# Patient Record
Sex: Female | Born: 1985
Health system: Southern US, Community
[De-identification: ages and names within clinical notes are randomized; demographics above are authoritative.]

## PROBLEM LIST (undated history)

## (undated) DIAGNOSIS — D649 Anemia, unspecified: Secondary | ICD-10-CM

## (undated) DIAGNOSIS — L88 Pyoderma gangrenosum: Secondary | ICD-10-CM

## (undated) DIAGNOSIS — E119 Type 2 diabetes mellitus without complications: Secondary | ICD-10-CM

## (undated) DIAGNOSIS — G709 Myoneural disorder, unspecified: Secondary | ICD-10-CM

## (undated) DIAGNOSIS — F32A Depression, unspecified: Secondary | ICD-10-CM

## (undated) DIAGNOSIS — F419 Anxiety disorder, unspecified: Secondary | ICD-10-CM

## (undated) DIAGNOSIS — K219 Gastro-esophageal reflux disease without esophagitis: Secondary | ICD-10-CM

## (undated) HISTORY — PX: WISDOM TOOTH EXTRACTION: SHX21

## (undated) HISTORY — PX: DIAGNOSTIC LAPAROSCOPY: SUR761

---

## 2013-01-23 DIAGNOSIS — F3175 Bipolar disorder, in partial remission, most recent episode depressed: Secondary | ICD-10-CM | POA: Diagnosis not present

## 2013-01-23 DIAGNOSIS — IMO0002 Reserved for concepts with insufficient information to code with codable children: Secondary | ICD-10-CM | POA: Diagnosis not present

## 2013-02-27 DIAGNOSIS — Z79899 Other long term (current) drug therapy: Secondary | ICD-10-CM | POA: Diagnosis not present

## 2013-03-07 DIAGNOSIS — Z79899 Other long term (current) drug therapy: Secondary | ICD-10-CM | POA: Diagnosis not present

## 2013-03-22 DIAGNOSIS — Z79899 Other long term (current) drug therapy: Secondary | ICD-10-CM | POA: Diagnosis not present

## 2013-04-02 DIAGNOSIS — Z79899 Other long term (current) drug therapy: Secondary | ICD-10-CM | POA: Diagnosis not present

## 2013-04-18 DIAGNOSIS — Z79899 Other long term (current) drug therapy: Secondary | ICD-10-CM | POA: Diagnosis not present

## 2013-05-02 DIAGNOSIS — Z79899 Other long term (current) drug therapy: Secondary | ICD-10-CM | POA: Diagnosis not present

## 2013-05-16 DIAGNOSIS — Z79899 Other long term (current) drug therapy: Secondary | ICD-10-CM | POA: Diagnosis not present

## 2013-05-23 DIAGNOSIS — Z79899 Other long term (current) drug therapy: Secondary | ICD-10-CM | POA: Diagnosis not present

## 2013-05-30 DIAGNOSIS — Z79899 Other long term (current) drug therapy: Secondary | ICD-10-CM | POA: Diagnosis not present

## 2013-06-20 DIAGNOSIS — Z79899 Other long term (current) drug therapy: Secondary | ICD-10-CM | POA: Diagnosis not present

## 2013-08-01 DIAGNOSIS — R82998 Other abnormal findings in urine: Secondary | ICD-10-CM | POA: Diagnosis not present

## 2013-09-12 DIAGNOSIS — Z79899 Other long term (current) drug therapy: Secondary | ICD-10-CM | POA: Diagnosis not present

## 2014-08-26 DIAGNOSIS — R5382 Chronic fatigue, unspecified: Secondary | ICD-10-CM | POA: Diagnosis not present

## 2014-09-03 DIAGNOSIS — R945 Abnormal results of liver function studies: Secondary | ICD-10-CM | POA: Diagnosis not present

## 2014-09-03 DIAGNOSIS — R7989 Other specified abnormal findings of blood chemistry: Secondary | ICD-10-CM | POA: Diagnosis not present

## 2015-07-11 DIAGNOSIS — J029 Acute pharyngitis, unspecified: Secondary | ICD-10-CM | POA: Diagnosis not present

## 2015-07-11 DIAGNOSIS — H6691 Otitis media, unspecified, right ear: Secondary | ICD-10-CM | POA: Diagnosis not present

## 2016-06-27 DIAGNOSIS — L03116 Cellulitis of left lower limb: Secondary | ICD-10-CM | POA: Diagnosis not present

## 2016-06-27 DIAGNOSIS — M7989 Other specified soft tissue disorders: Secondary | ICD-10-CM | POA: Diagnosis not present

## 2017-01-16 ENCOUNTER — Inpatient Hospital Stay (HOSPITAL_COMMUNITY): Payer: Medicare Other

## 2017-01-16 ENCOUNTER — Encounter (HOSPITAL_COMMUNITY)
Admission: EM | Disposition: A | Discharge status: Home Health | Payer: Self-pay | Source: Home / Self Care | Attending: Family Medicine

## 2017-01-16 ENCOUNTER — Encounter (HOSPITAL_COMMUNITY): Payer: Self-pay | Admitting: Emergency Medicine

## 2017-01-16 ENCOUNTER — Emergency Department (HOSPITAL_COMMUNITY): Payer: Medicare Other

## 2017-01-16 ENCOUNTER — Inpatient Hospital Stay (HOSPITAL_COMMUNITY)
Admission: EM | Admit: 2017-01-16 | Discharge: 2017-01-21 | DRG: 571 | Disposition: A | Discharge status: Home Health | Payer: Medicare Other | Attending: Family Medicine | Admitting: Family Medicine

## 2017-01-16 ENCOUNTER — Other Ambulatory Visit: Payer: Self-pay

## 2017-01-16 ENCOUNTER — Inpatient Hospital Stay (HOSPITAL_COMMUNITY): Payer: Medicare Other | Admitting: Certified Registered Nurse Anesthetist

## 2017-01-16 DIAGNOSIS — Z8659 Personal history of other mental and behavioral disorders: Secondary | ICD-10-CM

## 2017-01-16 DIAGNOSIS — R110 Nausea: Secondary | ICD-10-CM | POA: Diagnosis present

## 2017-01-16 DIAGNOSIS — L97829 Non-pressure chronic ulcer of other part of left lower leg with unspecified severity: Secondary | ICD-10-CM | POA: Diagnosis present

## 2017-01-16 DIAGNOSIS — Z8632 Personal history of gestational diabetes: Secondary | ICD-10-CM | POA: Diagnosis not present

## 2017-01-16 DIAGNOSIS — I83028 Varicose veins of left lower extremity with ulcer other part of lower leg: Secondary | ICD-10-CM

## 2017-01-16 DIAGNOSIS — L0293 Carbuncle, unspecified: Secondary | ICD-10-CM | POA: Diagnosis present

## 2017-01-16 DIAGNOSIS — D619 Aplastic anemia, unspecified: Secondary | ICD-10-CM | POA: Diagnosis present

## 2017-01-16 DIAGNOSIS — Z23 Encounter for immunization: Secondary | ICD-10-CM | POA: Diagnosis not present

## 2017-01-16 DIAGNOSIS — L97822 Non-pressure chronic ulcer of other part of left lower leg with fat layer exposed: Secondary | ICD-10-CM

## 2017-01-16 DIAGNOSIS — L0292 Furuncle, unspecified: Secondary | ICD-10-CM | POA: Diagnosis present

## 2017-01-16 DIAGNOSIS — A419 Sepsis, unspecified organism: Secondary | ICD-10-CM

## 2017-01-16 DIAGNOSIS — F1990 Other psychoactive substance use, unspecified, uncomplicated: Secondary | ICD-10-CM | POA: Diagnosis present

## 2017-01-16 DIAGNOSIS — I96 Gangrene, not elsewhere classified: Secondary | ICD-10-CM | POA: Diagnosis not present

## 2017-01-16 DIAGNOSIS — Z6841 Body Mass Index (BMI) 40.0 and over, adult: Secondary | ICD-10-CM

## 2017-01-16 DIAGNOSIS — Z66 Do not resuscitate: Secondary | ICD-10-CM | POA: Diagnosis present

## 2017-01-16 DIAGNOSIS — Z8742 Personal history of other diseases of the female genital tract: Secondary | ICD-10-CM

## 2017-01-16 DIAGNOSIS — L97919 Non-pressure chronic ulcer of unspecified part of right lower leg with unspecified severity: Secondary | ICD-10-CM

## 2017-01-16 DIAGNOSIS — L03116 Cellulitis of left lower limb: Secondary | ICD-10-CM | POA: Diagnosis present

## 2017-01-16 DIAGNOSIS — S81802A Unspecified open wound, left lower leg, initial encounter: Secondary | ICD-10-CM | POA: Diagnosis present

## 2017-01-16 DIAGNOSIS — F17210 Nicotine dependence, cigarettes, uncomplicated: Secondary | ICD-10-CM | POA: Diagnosis present

## 2017-01-16 DIAGNOSIS — L97819 Non-pressure chronic ulcer of other part of right lower leg with unspecified severity: Secondary | ICD-10-CM | POA: Diagnosis present

## 2017-01-16 DIAGNOSIS — Z205 Contact with and (suspected) exposure to viral hepatitis: Secondary | ICD-10-CM | POA: Diagnosis present

## 2017-01-16 DIAGNOSIS — L97929 Non-pressure chronic ulcer of unspecified part of left lower leg with unspecified severity: Secondary | ICD-10-CM

## 2017-01-16 DIAGNOSIS — E11622 Type 2 diabetes mellitus with other skin ulcer: Secondary | ICD-10-CM | POA: Diagnosis present

## 2017-01-16 DIAGNOSIS — I83008 Varicose veins of unspecified lower extremity with ulcer other part of lower leg: Secondary | ICD-10-CM | POA: Diagnosis present

## 2017-01-16 DIAGNOSIS — Z888 Allergy status to other drugs, medicaments and biological substances status: Secondary | ICD-10-CM

## 2017-01-16 DIAGNOSIS — Z207 Contact with and (suspected) exposure to pediculosis, acariasis and other infestations: Secondary | ICD-10-CM | POA: Diagnosis present

## 2017-01-16 DIAGNOSIS — L88 Pyoderma gangrenosum: Secondary | ICD-10-CM | POA: Diagnosis present

## 2017-01-16 DIAGNOSIS — S81802D Unspecified open wound, left lower leg, subsequent encounter: Secondary | ICD-10-CM | POA: Diagnosis present

## 2017-01-16 DIAGNOSIS — E118 Type 2 diabetes mellitus with unspecified complications: Secondary | ICD-10-CM | POA: Diagnosis present

## 2017-01-16 DIAGNOSIS — E119 Type 2 diabetes mellitus without complications: Secondary | ICD-10-CM

## 2017-01-16 DIAGNOSIS — D509 Iron deficiency anemia, unspecified: Secondary | ICD-10-CM | POA: Diagnosis present

## 2017-01-16 DIAGNOSIS — Z590 Homelessness: Secondary | ICD-10-CM

## 2017-01-16 HISTORY — PX: APPLICATION OF WOUND VAC: SHX5189

## 2017-01-16 HISTORY — DX: Type 2 diabetes mellitus without complications: E11.9

## 2017-01-16 HISTORY — PX: I & D EXTREMITY: SHX5045

## 2017-01-16 LAB — URINALYSIS, ROUTINE W REFLEX MICROSCOPIC
Bilirubin Urine: NEGATIVE
Glucose, UA: NEGATIVE mg/dL
Hgb urine dipstick: NEGATIVE
Ketones, ur: NEGATIVE mg/dL
LEUKOCYTES UA: NEGATIVE
NITRITE: NEGATIVE
PH: 5 (ref 5.0–8.0)
Protein, ur: NEGATIVE mg/dL
SPECIFIC GRAVITY, URINE: 1.04 — AB (ref 1.005–1.030)

## 2017-01-16 LAB — CBC WITH DIFFERENTIAL/PLATELET
Basophils Absolute: 0.1 10*3/uL (ref 0.0–0.1)
Basophils Relative: 1 %
Eosinophils Absolute: 0.5 10*3/uL (ref 0.0–0.7)
Eosinophils Relative: 4 %
HCT: 34.3 % — ABNORMAL LOW (ref 36.0–46.0)
HEMOGLOBIN: 10.8 g/dL — AB (ref 12.0–15.0)
LYMPHS ABS: 2.6 10*3/uL (ref 0.7–4.0)
LYMPHS PCT: 24 %
MCH: 24.4 pg — AB (ref 26.0–34.0)
MCHC: 31.5 g/dL (ref 30.0–36.0)
MCV: 77.4 fL — AB (ref 78.0–100.0)
MONOS PCT: 6 %
Monocytes Absolute: 0.6 10*3/uL (ref 0.1–1.0)
NEUTROS PCT: 65 %
Neutro Abs: 6.9 10*3/uL (ref 1.7–7.7)
Platelets: 364 10*3/uL (ref 150–400)
RBC: 4.43 MIL/uL (ref 3.87–5.11)
RDW: 15.4 % (ref 11.5–15.5)
WBC: 10.6 10*3/uL — AB (ref 4.0–10.5)

## 2017-01-16 LAB — COMPREHENSIVE METABOLIC PANEL
ALBUMIN: 3.9 g/dL (ref 3.5–5.0)
ALT: 39 U/L (ref 14–54)
ANION GAP: 13 (ref 5–15)
AST: 31 U/L (ref 15–41)
Alkaline Phosphatase: 141 U/L — ABNORMAL HIGH (ref 38–126)
BUN: 7 mg/dL (ref 6–20)
CHLORIDE: 101 mmol/L (ref 101–111)
CO2: 22 mmol/L (ref 22–32)
CREATININE: 0.74 mg/dL (ref 0.44–1.00)
Calcium: 9.2 mg/dL (ref 8.9–10.3)
GFR calc non Af Amer: >60 mL/min (ref >60)
GLUCOSE: 145 mg/dL — AB (ref 65–99)
Potassium: 3.7 mmol/L (ref 3.5–5.1)
SODIUM: 136 mmol/L (ref 135–145)
Total Bilirubin: 0.6 mg/dL (ref 0.3–1.2)
Total Protein: 7.9 g/dL (ref 6.5–8.1)

## 2017-01-16 LAB — HIV ANTIBODY (ROUTINE TESTING W REFLEX): HIV Screen 4th Generation wRfx: NONREACTIVE

## 2017-01-16 LAB — PROTIME-INR
INR: 1.12
Prothrombin Time: 14.3 seconds (ref 11.4–15.2)

## 2017-01-16 LAB — SURGICAL PCR SCREEN
MRSA, PCR: NEGATIVE
Staphylococcus aureus: NEGATIVE

## 2017-01-16 LAB — I-STAT BETA HCG BLOOD, ED (MC, WL, AP ONLY): I-stat hCG, quantitative: <5 m[IU]/mL (ref <5)

## 2017-01-16 LAB — I-STAT CG4 LACTIC ACID, ED
Lactic Acid, Venous: 2.5 mmol/L (ref 0.5–1.9)
Lactic Acid, Venous: 1.45 mmol/L (ref 0.5–1.9)

## 2017-01-16 LAB — HEMOGLOBIN A1C
Hgb A1c MFr Bld: 6.5 % — ABNORMAL HIGH (ref 4.8–5.6)
MEAN PLASMA GLUCOSE: 139.85 mg/dL

## 2017-01-16 LAB — GLUCOSE, CAPILLARY: Glucose-Capillary: 122 mg/dL — ABNORMAL HIGH (ref 65–99)

## 2017-01-16 SURGERY — IRRIGATION AND DEBRIDEMENT EXTREMITY
Anesthesia: General | Site: Leg Lower | Laterality: Left

## 2017-01-16 MED ORDER — OXYCODONE HCL 5 MG PO TABS: 5.0000 mg | ORAL_TABLET | Freq: Once | ORAL | Status: DC | PRN

## 2017-01-16 MED ORDER — IBUPROFEN 200 MG PO TABS: 200.0000 mg | ORAL_TABLET | Freq: Four times a day (QID) | ORAL | Status: DC | PRN

## 2017-01-16 MED ORDER — MIDAZOLAM HCL 2 MG/2ML IJ SOLN
INTRAMUSCULAR | Status: AC
  Filled 2017-01-16: qty 2

## 2017-01-16 MED ORDER — PROMETHAZINE HCL 25 MG/ML IJ SOLN
INTRAMUSCULAR | Status: AC
  Filled 2017-01-16: qty 1

## 2017-01-16 MED ORDER — SODIUM CHLORIDE 0.9 % IV BOLUS (SEPSIS)
1000.0000 mL | Freq: Once | INTRAVENOUS | Status: AC
  Administered 2017-01-16: 1000 mL via INTRAVENOUS

## 2017-01-16 MED ORDER — GADOBENATE DIMEGLUMINE 529 MG/ML IV SOLN
20.0000 mL | Freq: Once | INTRAVENOUS | Status: AC
  Administered 2017-01-16: 20 mL via INTRAVENOUS

## 2017-01-16 MED ORDER — SODIUM CHLORIDE 0.9 % IR SOLN
Status: DC | PRN
  Administered 2017-01-16: 3000 mL
  Administered 2017-01-16: 1000 mL

## 2017-01-16 MED ORDER — ONDANSETRON HCL 4 MG/2ML IJ SOLN
INTRAMUSCULAR | Status: AC
  Filled 2017-01-16: qty 2

## 2017-01-16 MED ORDER — OXYCODONE HCL 5 MG/5ML PO SOLN: 5.0000 mg | Freq: Once | ORAL | Status: DC | PRN

## 2017-01-16 MED ORDER — FENTANYL CITRATE (PF) 250 MCG/5ML IJ SOLN
INTRAMUSCULAR | Status: AC
  Filled 2017-01-16: qty 5

## 2017-01-16 MED ORDER — ASPIRIN 325 MG PO TABS
325.0000 mg | ORAL_TABLET | Freq: Every day | ORAL | Status: DC
  Administered 2017-01-17 – 2017-01-21 (×4): 325 mg via ORAL
  Filled 2017-01-16 (×4): qty 1

## 2017-01-16 MED ORDER — SODIUM CHLORIDE 0.9 % IV SOLN
2500.0000 mg | Freq: Once | INTRAVENOUS | Status: AC
  Administered 2017-01-16: 2500 mg via INTRAVENOUS
  Filled 2017-01-16: qty 2500

## 2017-01-16 MED ORDER — CHLORHEXIDINE GLUCONATE 4 % EX LIQD: 60.0000 mL | Freq: Once | CUTANEOUS | Status: DC

## 2017-01-16 MED ORDER — METHOCARBAMOL 500 MG PO TABS
500.0000 mg | ORAL_TABLET | Freq: Four times a day (QID) | ORAL | Status: DC | PRN
  Administered 2017-01-16 – 2017-01-20 (×12): 500 mg via ORAL
  Filled 2017-01-16 (×12): qty 1

## 2017-01-16 MED ORDER — MIDAZOLAM HCL 5 MG/5ML IJ SOLN
INTRAMUSCULAR | Status: DC | PRN
  Administered 2017-01-16: 2 mg via INTRAVENOUS

## 2017-01-16 MED ORDER — PROPOFOL 10 MG/ML IV BOLUS
INTRAVENOUS | Status: DC | PRN
  Administered 2017-01-16: 300 mg via INTRAVENOUS

## 2017-01-16 MED ORDER — OXYCODONE-ACETAMINOPHEN 5-325 MG PO TABS
1.0000 | ORAL_TABLET | ORAL | Status: DC | PRN
  Administered 2017-01-16 – 2017-01-21 (×19): 2 via ORAL
  Filled 2017-01-16 (×19): qty 2

## 2017-01-16 MED ORDER — ACETAMINOPHEN 650 MG RE SUPP: 650.0000 mg | Freq: Four times a day (QID) | RECTAL | Status: DC | PRN

## 2017-01-16 MED ORDER — LIDOCAINE 2% (20 MG/ML) 5 ML SYRINGE
INTRAMUSCULAR | Status: DC | PRN
  Administered 2017-01-16: 100 mg via INTRAVENOUS

## 2017-01-16 MED ORDER — DEXAMETHASONE SODIUM PHOSPHATE 10 MG/ML IJ SOLN
INTRAMUSCULAR | Status: AC
  Filled 2017-01-16: qty 1

## 2017-01-16 MED ORDER — VANCOMYCIN HCL IN DEXTROSE 1-5 GM/200ML-% IV SOLN
1000.0000 mg | Freq: Three times a day (TID) | INTRAVENOUS | Status: DC
  Administered 2017-01-16 – 2017-01-17 (×2): 1000 mg via INTRAVENOUS
  Filled 2017-01-16 (×5): qty 200

## 2017-01-16 MED ORDER — ENOXAPARIN SODIUM 40 MG/0.4ML ~~LOC~~ SOLN: 40.0000 mg | SUBCUTANEOUS | Status: DC

## 2017-01-16 MED ORDER — SCOPOLAMINE 1 MG/3DAYS TD PT72
MEDICATED_PATCH | TRANSDERMAL | Status: DC | PRN
  Administered 2017-01-16: 1 via TRANSDERMAL

## 2017-01-16 MED ORDER — DEXAMETHASONE SODIUM PHOSPHATE 10 MG/ML IJ SOLN
INTRAMUSCULAR | Status: DC | PRN
  Administered 2017-01-16: 5 mg via INTRAVENOUS

## 2017-01-16 MED ORDER — ONDANSETRON HCL 4 MG/2ML IJ SOLN
INTRAMUSCULAR | Status: DC | PRN
  Administered 2017-01-16: 4 mg via INTRAVENOUS

## 2017-01-16 MED ORDER — SCOPOLAMINE 1 MG/3DAYS TD PT72
MEDICATED_PATCH | TRANSDERMAL | Status: AC
  Filled 2017-01-16: qty 1

## 2017-01-16 MED ORDER — PIPERACILLIN-TAZOBACTAM 3.375 G IVPB 30 MIN
3.3750 g | Freq: Once | INTRAVENOUS | Status: AC
  Administered 2017-01-16: 3.375 g via INTRAVENOUS
  Filled 2017-01-16: qty 50

## 2017-01-16 MED ORDER — PROMETHAZINE HCL 25 MG/ML IJ SOLN
6.2500 mg | INTRAMUSCULAR | Status: DC | PRN
  Administered 2017-01-16: 6.25 mg via INTRAVENOUS

## 2017-01-16 MED ORDER — HYDROMORPHONE HCL 1 MG/ML IJ SOLN
0.5000 mg | INTRAMUSCULAR | Status: DC | PRN
  Administered 2017-01-16 – 2017-01-19 (×5): 1 mg via INTRAVENOUS
  Filled 2017-01-16 (×5): qty 1

## 2017-01-16 MED ORDER — ACETAMINOPHEN 325 MG PO TABS: 650.0000 mg | ORAL_TABLET | Freq: Four times a day (QID) | ORAL | Status: DC | PRN

## 2017-01-16 MED ORDER — COLLAGENASE 250 UNIT/GM EX OINT
TOPICAL_OINTMENT | Freq: Every day | CUTANEOUS | Status: DC
  Filled 2017-01-16: qty 30

## 2017-01-16 MED ORDER — ONDANSETRON HCL 4 MG/2ML IJ SOLN: 4.0000 mg | Freq: Four times a day (QID) | INTRAMUSCULAR | Status: DC | PRN

## 2017-01-16 MED ORDER — POVIDONE-IODINE 10 % EX SWAB: 2.0000 "application " | Freq: Once | CUTANEOUS | Status: DC

## 2017-01-16 MED ORDER — METHOCARBAMOL 500 MG PO TABS: 500.0000 mg | ORAL_TABLET | Freq: Three times a day (TID) | ORAL | Status: DC | PRN

## 2017-01-16 MED ORDER — 0.9 % SODIUM CHLORIDE (POUR BTL) OPTIME
TOPICAL | Status: DC | PRN
  Administered 2017-01-16: 1000 mL

## 2017-01-16 MED ORDER — PNEUMOCOCCAL VAC POLYVALENT 25 MCG/0.5ML IJ INJ
0.5000 mL | INJECTION | INTRAMUSCULAR | Status: AC
  Administered 2017-01-17: 0.5 mL via INTRAMUSCULAR
  Filled 2017-01-16: qty 0.5

## 2017-01-16 MED ORDER — FENTANYL CITRATE (PF) 100 MCG/2ML IJ SOLN
INTRAMUSCULAR | Status: DC | PRN
  Administered 2017-01-16 (×13): 50 ug via INTRAVENOUS

## 2017-01-16 MED ORDER — LACTATED RINGERS IV SOLN
INTRAVENOUS | Status: DC
  Administered 2017-01-16 – 2017-01-19 (×3): via INTRAVENOUS

## 2017-01-16 MED ORDER — FENTANYL CITRATE (PF) 100 MCG/2ML IJ SOLN
25.0000 ug | INTRAMUSCULAR | Status: DC | PRN
  Administered 2017-01-16 (×2): 50 ug via INTRAVENOUS

## 2017-01-16 MED ORDER — INFLUENZA VAC SPLIT QUAD 0.5 ML IM SUSY
0.5000 mL | PREFILLED_SYRINGE | INTRAMUSCULAR | Status: AC
  Administered 2017-01-19: 0.5 mL via INTRAMUSCULAR
  Filled 2017-01-16: qty 0.5

## 2017-01-16 MED ORDER — CEFAZOLIN SODIUM 10 G IJ SOLR
3.0000 g | INTRAMUSCULAR | Status: AC
  Administered 2017-01-16: 3 g via INTRAVENOUS
  Filled 2017-01-16 (×2): qty 3000

## 2017-01-16 MED ORDER — PIPERACILLIN-TAZOBACTAM 3.375 G IVPB
3.3750 g | Freq: Three times a day (TID) | INTRAVENOUS | Status: DC
  Administered 2017-01-16 – 2017-01-18 (×6): 3.375 g via INTRAVENOUS
  Filled 2017-01-16 (×8): qty 50

## 2017-01-16 MED ORDER — ONDANSETRON HCL 4 MG PO TABS: 4.0000 mg | ORAL_TABLET | Freq: Four times a day (QID) | ORAL | Status: DC | PRN

## 2017-01-16 MED ORDER — FENTANYL CITRATE (PF) 100 MCG/2ML IJ SOLN
INTRAMUSCULAR | Status: AC
  Filled 2017-01-16: qty 2

## 2017-01-16 SURGICAL SUPPLY — 57 items
BAG DECANTER FOR FLEXI CONT (MISCELLANEOUS) IMPLANT
BANDAGE ACE 3X5.8 VEL STRL LF (GAUZE/BANDAGES/DRESSINGS) ×3 IMPLANT
BANDAGE ACE 4X5 VEL STRL LF (GAUZE/BANDAGES/DRESSINGS) ×3 IMPLANT
BANDAGE ACE 6X5 VEL STRL LF (GAUZE/BANDAGES/DRESSINGS) ×3 IMPLANT
BANDAGE ESMARK 6X9 LF (GAUZE/BANDAGES/DRESSINGS) IMPLANT
BNDG COHESIVE 4X5 TAN STRL (GAUZE/BANDAGES/DRESSINGS) ×3 IMPLANT
BNDG ESMARK 6X9 LF (GAUZE/BANDAGES/DRESSINGS)
BNDG GAUZE ELAST 4 BULKY (GAUZE/BANDAGES/DRESSINGS) ×3 IMPLANT
CANISTER WOUND CARE 500ML ATS (WOUND CARE) ×3 IMPLANT
CASSETTE VERAFLO VERALINK (MISCELLANEOUS) ×3 IMPLANT
CONT SPEC 4OZ CLIKSEAL STRL BL (MISCELLANEOUS) ×6 IMPLANT
CUFF TOURNIQUET SINGLE 18IN (TOURNIQUET CUFF) IMPLANT
CUFF TOURNIQUET SINGLE 24IN (TOURNIQUET CUFF) IMPLANT
CUFF TOURNIQUET SINGLE 34IN LL (TOURNIQUET CUFF) ×3 IMPLANT
CUFF TOURNIQUET SINGLE 44IN (TOURNIQUET CUFF) IMPLANT
DRAPE U-SHAPE 47X51 STRL (DRAPES) ×3 IMPLANT
DRESSING VERAFLO CLEANSE CC (GAUZE/BANDAGES/DRESSINGS) ×1 IMPLANT
DRSG ADAPTIC 3X8 NADH LF (GAUZE/BANDAGES/DRESSINGS) ×3 IMPLANT
DRSG EMULSION OIL 3X3 NADH (GAUZE/BANDAGES/DRESSINGS) ×3 IMPLANT
DRSG PAD ABDOMINAL 8X10 ST (GAUZE/BANDAGES/DRESSINGS) ×3 IMPLANT
DRSG VERAFLO CLEANSE CC (GAUZE/BANDAGES/DRESSINGS) ×3
DURAPREP 26ML APPLICATOR (WOUND CARE) IMPLANT
ELECT CAUTERY BLADE 6.4 (BLADE) IMPLANT
ELECT REM PT RETURN 9FT ADLT (ELECTROSURGICAL)
ELECTRODE REM PT RTRN 9FT ADLT (ELECTROSURGICAL) IMPLANT
FACESHIELD WRAPAROUND (MASK) ×6 IMPLANT
GAUZE SPONGE 4X4 12PLY STRL (GAUZE/BANDAGES/DRESSINGS) ×3 IMPLANT
GAUZE XEROFORM 1X8 LF (GAUZE/BANDAGES/DRESSINGS) ×3 IMPLANT
GLOVE BIOGEL PI IND STRL 8 (GLOVE) ×2 IMPLANT
GLOVE BIOGEL PI INDICATOR 8 (GLOVE) ×4
GLOVE ECLIPSE 7.5 STRL STRAW (GLOVE) ×6 IMPLANT
GOWN STRL REUS W/ TWL LRG LVL3 (GOWN DISPOSABLE) ×2 IMPLANT
GOWN STRL REUS W/ TWL XL LVL3 (GOWN DISPOSABLE) ×2 IMPLANT
GOWN STRL REUS W/TWL LRG LVL3 (GOWN DISPOSABLE) ×4
GOWN STRL REUS W/TWL XL LVL3 (GOWN DISPOSABLE) ×4
HANDPIECE INTERPULSE COAX TIP (DISPOSABLE) ×2
KIT BASIN OR (CUSTOM PROCEDURE TRAY) ×3 IMPLANT
KIT ROOM TURNOVER OR (KITS) ×3 IMPLANT
MANIFOLD NEPTUNE II (INSTRUMENTS) ×3 IMPLANT
NS IRRIG 1000ML POUR BTL (IV SOLUTION) ×3 IMPLANT
PACK ORTHO EXTREMITY (CUSTOM PROCEDURE TRAY) ×3 IMPLANT
PAD ARMBOARD 7.5X6 YLW CONV (MISCELLANEOUS) ×6 IMPLANT
PAD CAST 4YDX4 CTTN HI CHSV (CAST SUPPLIES) ×1 IMPLANT
PADDING CAST COTTON 4X4 STRL (CAST SUPPLIES) ×2
PUNCH BIOPSY 4MM (MISCELLANEOUS) ×9
PUNCH BIOPSY DISP 4 (MISCELLANEOUS) ×3 IMPLANT
SET HNDPC FAN SPRY TIP SCT (DISPOSABLE) ×1 IMPLANT
SPONGE LAP 18X18 X RAY DECT (DISPOSABLE) ×3 IMPLANT
STOCKINETTE IMPERVIOUS 9X36 MD (GAUZE/BANDAGES/DRESSINGS) ×3 IMPLANT
SWAB CULTURE ESWAB REG 1ML (MISCELLANEOUS) ×3 IMPLANT
TOWEL OR 17X24 6PK STRL BLUE (TOWEL DISPOSABLE) ×3 IMPLANT
TOWEL OR 17X26 10 PK STRL BLUE (TOWEL DISPOSABLE) ×3 IMPLANT
TUBE CONNECTING 12'X1/4 (SUCTIONS) ×1
TUBE CONNECTING 12X1/4 (SUCTIONS) ×2 IMPLANT
UNDERPAD 30X30 (UNDERPADS AND DIAPERS) ×3 IMPLANT
WATER STERILE IRR 1000ML POUR (IV SOLUTION) IMPLANT
YANKAUER SUCT BULB TIP NO VENT (SUCTIONS) ×3 IMPLANT

## 2017-01-16 NOTE — Consult Note (Signed)
Reason for Consult:Left shin ulceration Referring Physician: W Karle Desrosier is an 32 y.o. female.  HPI: Trea was in her previous state of health when she developed a small wound on her left shin about 6 weeks ago. She denies any history of trauma or insect bite. She denies any prior wound issues though it is noted in her chart that she admitted to prior wounds that have healed. It has steadily gotten worse. She has not really done anything to treat it. She does admit to wearing compression socks prior to the development of the wound to help with swelling. She works full-time at [employer].  Past Medical History:  Diagnosis Date  . Diabetes mellitus without complication ([place])    with pregnancy    Past Surgical History:  Procedure Laterality Date  . CESAREAN SECTION      No family history on file.  Social History:  reports that she has been smoking cigarettes.  She has been smoking about 0.50 packs per day. she has never used smokeless tobacco. She reports that she does not drink alcohol or use drugs.  Allergies:  Allergies  Allergen Reactions  . Tegretol [Carbamazepine] Other (See Comments)    Causes aplastic anemia  . Ambien [Zolpidem Tartrate] Rash    Medications: I have reviewed the patient's current medications.  Results for orders placed or performed during the hospital encounter of 01/16/17 (from the past 48 hour(s))  Urinalysis, Routine w reflex microscopic     Status: Abnormal   Collection Time: 01/16/17  1:49 AM  Result Value Ref Range   Color, Urine YELLOW YELLOW   APPearance HAZY (A) CLEAR   Specific Gravity, Urine 1.040 (H) 1.005 - 1.030   pH 5.0 5.0 - 8.0   Glucose, UA NEGATIVE NEGATIVE mg/dL   Hgb urine dipstick NEGATIVE NEGATIVE   Bilirubin Urine NEGATIVE NEGATIVE   Ketones, ur NEGATIVE NEGATIVE mg/dL   Protein, ur NEGATIVE NEGATIVE mg/dL   Nitrite NEGATIVE NEGATIVE   Leukocytes, UA NEGATIVE NEGATIVE  Comprehensive metabolic panel     Status:  Abnormal   Collection Time: 01/16/17  1:52 AM  Result Value Ref Range   Sodium 136 135 - 145 mmol/L   Potassium 3.7 3.5 - 5.1 mmol/L   Chloride 101 101 - 111 mmol/L   CO2 22 22 - 32 mmol/L   Glucose, Bld 145 (H) 65 - 99 mg/dL   BUN 7 6 - 20 mg/dL   Creatinine, Ser 0.74 0.44 - 1.00 mg/dL   Calcium 9.2 8.9 - 10.3 mg/dL   Total Protein 7.9 6.5 - 8.1 g/dL   Albumin 3.9 3.5 - 5.0 g/dL   AST 31 15 - 41 U/L   ALT 39 14 - 54 U/L   Alkaline Phosphatase 141 (H) 38 - 126 U/L   Total Bilirubin 0.6 0.3 - 1.2 mg/dL   GFR calc non Af Amer >60 >60 mL/min   GFR calc Af Amer >60 >60 mL/min    Comment: (NOTE) The eGFR has been calculated using the CKD EPI equation. This calculation has not been validated in all clinical situations. eGFR's persistently <60 mL/min signify possible Chronic Kidney Disease.    Anion gap 13 5 - 15  CBC with Differential     Status: Abnormal   Collection Time: 01/16/17  1:52 AM  Result Value Ref Range   WBC 10.6 (H) 4.0 - 10.5 K/uL   RBC 4.43 3.87 - 5.11 MIL/uL   Hemoglobin 10.8 (L) 12.0 - 15.0 g/dL  HCT 34.3 (L) 36.0 - 46.0 %   MCV 77.4 (L) 78.0 - 100.0 fL   MCH 24.4 (L) 26.0 - 34.0 pg   MCHC 31.5 30.0 - 36.0 g/dL   RDW 15.4 11.5 - 15.5 %   Platelets 364 150 - 400 K/uL   Neutrophils Relative % 65 %   Neutro Abs 6.9 1.7 - 7.7 K/uL   Lymphocytes Relative 24 %   Lymphs Abs 2.6 0.7 - 4.0 K/uL   Monocytes Relative 6 %   Monocytes Absolute 0.6 0.1 - 1.0 K/uL   Eosinophils Relative 4 %   Eosinophils Absolute 0.5 0.0 - 0.7 K/uL   Basophils Relative 1 %   Basophils Absolute 0.1 0.0 - 0.1 K/uL  Protime-INR     Status: None   Collection Time: 01/16/17  1:52 AM  Result Value Ref Range   Prothrombin Time 14.3 11.4 - 15.2 seconds   INR 1.12   I-Stat beta hCG blood, ED     Status: None   Collection Time: 01/16/17  2:12 AM  Result Value Ref Range   I-stat hCG, quantitative <5.0 <5 mIU/mL   Comment 3            Comment:   GEST. AGE      CONC.  (mIU/mL)   <=1 WEEK         5 - 50     2 WEEKS       50 - 500     3 WEEKS       100 - 10,000     4 WEEKS     1,000 - 30,000        FEMALE AND NON-PREGNANT FEMALE:     LESS THAN 5 mIU/mL   I-Stat CG4 Lactic Acid, ED     Status: Abnormal   Collection Time: 01/16/17  2:15 AM  Result Value Ref Range   Lactic Acid, Venous 2.50 (HH) 0.5 - 1.9 mmol/L  Wound or Superficial Culture     Status: None (Preliminary result)   Collection Time: 01/16/17  3:10 AM  Result Value Ref Range   Specimen Description WOUND LEFT LEG    Special Requests NONE    Gram Stain      RARE WBC PRESENT, PREDOMINANTLY PMN MODERATE GRAM POSITIVE COCCI IN PAIRS MODERATE GRAM NEGATIVE RODS RARE GRAM POSITIVE RODS    Culture PENDING    Report Status PENDING   I-Stat CG4 Lactic Acid, ED     Status: None   Collection Time: 01/16/17  5:09 AM  Result Value Ref Range   Lactic Acid, Venous 1.45 0.5 - 1.9 mmol/L  Hemoglobin A1c     Status: Abnormal   Collection Time: 01/16/17  5:28 AM  Result Value Ref Range   Hgb A1c MFr Bld 6.5 (H) 4.8 - 5.6 %    Comment: (NOTE) Pre diabetes:          5.7%-6.4% Diabetes:              >6.4% Glycemic control for   <7.0% adults with diabetes    Mean Plasma Glucose 139.85 mg/dL    Dg Tibia/fibula Left  Result Date: 01/16/2017 CLINICAL DATA:  LEFT lower extremity drainage with venous stasis ulcers. EXAM: LEFT TIBIA AND FIBULA - 2 VIEW COMPARISON:  None. FINDINGS: There is no evidence of fracture or other focal bone lesions. Soft tissue swelling with multiple skin defects in the anterolateral distal tibiofibular soft tissues. No subcutaneous gas or radiopaque foreign bodies. IMPRESSION: Distal  tibia and fibular ulceration without subcutaneous gas, radiopaque foreign bodies or acute osseous process. Electronically Signed   By: Elon Alas M.D.   On: 01/16/2017 03:16    Review of Systems  Constitutional: Negative for weight loss.  HENT: Negative for ear discharge, ear pain, hearing loss and tinnitus.    Eyes: Negative for blurred vision, double vision, photophobia and pain.  Respiratory: Negative for cough, sputum production and shortness of breath.   Cardiovascular: Negative for chest pain.  Gastrointestinal: Negative for abdominal pain, nausea and vomiting.  Genitourinary: Negative for dysuria, flank pain, frequency and urgency.  Musculoskeletal: Positive for joint pain (Left shin). Negative for back pain, falls, myalgias and neck pain.  Neurological: Negative for dizziness, tingling, sensory change, focal weakness, loss of consciousness and headaches.  Endo/Heme/Allergies: Does not bruise/bleed easily.  Psychiatric/Behavioral: Negative for depression, memory loss and substance abuse. The patient is not nervous/anxious.    Blood pressure (!) 115/58, pulse 88, temperature 98.3 F (36.8 C), temperature source Oral, resp. rate 20, height 5' 9" (1.753 m), weight 136.1 kg (300 lb), last menstrual period 12/21/2016, SpO2 99 %. Physical Exam  Constitutional: She appears well-developed and well-nourished. No distress.  HENT:  Head: Normocephalic.  Eyes: Conjunctivae are normal. Right eye exhibits no discharge. Left eye exhibits no discharge. No scleral icterus.  Neck: Normal range of motion.  Cardiovascular: Normal rate and regular rhythm.  Respiratory: Effort normal. No respiratory distress.  Musculoskeletal:  RLE No traumatic wounds or ecchymosis  Skin changes c/w venous stasis with small ulceration over medial anterior ankle with eschar  No knee or ankle effusion  Knee stable to varus/ valgus and anterior/posterior stress  Sens DPN, SPN, TN intact  Motor EHL, ext, flex, evers 5/5  DP 2+, PT 1+, No significant edema  LLE No traumatic wounds or ecchymosis  Skin changes c/w venous stasis, ulcerations noted over anterior shin, malodorous with yellowish exudate and some budding granulation noted.  No knee or ankle effusion  Knee stable to varus/ valgus and anterior/posterior stress  Sens  DPN, SPN, TN intact  Motor EHL, ext, flex, evers 5/5  DP 1+, PT 0, 2+ pitting edema  Neurological: She is alert.  Skin: Skin is warm and dry. She is not diaphoretic.  Psychiatric: She has a normal mood and affect. Her behavior is normal.    Assessment/Plan: Left tibia ulcerations -- Agree this is likely venous stasis ulcers. Given the appearance I think they would benefit for OR debridement. Will start on ASA 377m daily. WOC consult pending and will need referral to wound care center for long-term f/u. Will check venous [place] r/o DVT. DM -- Newly diagnosed Tobacco use -- Encouraged cessation    MLisette Abu PA-C Orthopedic Surgery [phone]/14/2019, 11:37 AM

## 2017-01-16 NOTE — Subjective & Objective (Signed)
[facility] Admission History and Physical Service Pager: [phone]  Patient name: [name] Medical record number: [id number] Date of birth: Jul 29, 1985 Age: 32 y.o. Gender: female  Primary Care Provider: Patient, No Pcp Per Consultants: none Code Status: full  Chief Complaint: Leg wounds  Assessment and Plan: Chevy Virgo is a 32 y.o. female presenting with bilateral lower extremity wounds   PMH is significant for none  ***  FEN/GI: regular diet Prophylaxis: lovenox  Disposition: likely home  History of Present Illness:  [name] is a 32 y.o. female presenting with ***  Review Of Systems: Per HPI with the following additions: ***  ROS  There are no active problems to display for this patient.   Past Medical History: Past Medical History:  Diagnosis Date  . Diabetes mellitus without complication ([place])    with pregnancy    Past Surgical History: Past Surgical History:  Procedure Laterality Date  . CESAREAN SECTION      Social History: Social History   Tobacco Use  . Smoking status: Current Some Day Smoker    Packs/day: 0.50    Types: Cigarettes  . Smokeless tobacco: Never Used  Substance Use Topics  . Alcohol use: No    Frequency: Never  . Drug use: No   Additional social history: ***  Please also refer to relevant sections of EMR.  Family History: No family history on file. (***If not completed, MUST add something in)  Allergies and Medications: Allergies  Allergen Reactions  . Tegretol [Carbamazepine] Other (See Comments)    Causes aplastic anemia  . Ambien [Zolpidem Tartrate] Rash   No current facility-administered medications on file prior to encounter.    Current Outpatient Medications on File Prior to Encounter  Medication Sig Dispense Refill  . acetaminophen (TYLENOL) 325 MG tablet Take 650 mg by mouth every 6 (six) hours as needed for mild pain.    Marland Kitchen ibuprofen (ADVIL,MOTRIN) 200 MG tablet  Take 200 mg by mouth every 6 (six) hours as needed for mild pain.      Objective: BP (!) 121/57   Pulse 87   Temp 98.6 F (37 C) (Oral)   Resp 18   Ht 5\' 9"  (1.753 m)   Wt 300 lb (136.1 kg)   LMP 12/21/2016   SpO2 94%   BMI 44.30 kg/m  Exam: General: *** Eyes: *** ENTM: *** Neck: *** Cardiovascular: *** Respiratory: *** Gastrointestinal: *** MSK: *** Derm: *** Neuro: *** Psych: ***  Labs and Imaging: CBC BMET  Recent Labs  Lab 01/16/17 0152  WBC 10.6*  HGB 10.8*  HCT 34.3*  PLT 364   Recent Labs  Lab 01/16/17 0152  NA 136  K 3.7  CL 101  CO2 22  BUN 7  CREATININE 0.74  GLUCOSE 145*  CALCIUM 9.2     Kaitlyn Dawn, MD 01/16/2017, 3:57 AM PGY-1, [place] pager: [phone], text pages welcome

## 2017-01-16 NOTE — ED Triage Notes (Signed)
Patient reports wounds to both lower legs.  The right leg appears scabbed over.  Left leg having foul drainage.  Reports having elevated blood sugar but is not being treated for diabetes.

## 2017-01-16 NOTE — H&P (Signed)
[facility] Admission History and Physical Service Pager: [phone]  Patient name: [name] Medical record number: [id number] Date of birth: 04/23/1985 Age: 32 y.o. Gender: female  Primary Care Provider: Patient, No Pcp Per Consultants: none Code Status: full  Chief Complaint: ulcers on legs  Assessment and Plan: [name] is a 32 y.o. female presenting with 6 week history of developing LLE ulcer.  Left Lower Extremity Ulcer Unclear why patient has large LLE ulcers. She states that she has gotten these her entire life, the only difference is that these failed to get better. Apparently had some skin color changes prior to developing this one. 1-2+ pitting edema on exam. Additionally patient with very difficult to palpate Bilateral lower extremity pulses due to edema. Will need to get arterial and venous dopplers to assess for arterial and venous insufficiency. Arterial problems unlikely in 32 year old, but given likely diabetes and smoking history cannot rule this out. Venous insufficiency seems more likely but this is also more unlikely in a 33 year old. If either of these are abnormal will likely benefit from vascular surgery consult. Patient with no reported trauma to this area to account for an infected wound. Could consider LLE MRI to rule out osteo but no apparent osteo on xray to the utility of this is likely limited. Will have wound care see and evaluate. Continue broad spectrum abx until wound cx is able to narrow. Can consider general surgical consultation for debridement of wounds and evaluation for skin grafts, but patient will likely opt for conservative management with close wound care follow up and antibiotics. - admit to inpatient, medsurg, Dr. Andria Frames - surgery consult; consider marginal biopsy - vital signs per floor routine - pulse ox with vitals - vanc per pharmacy consult - zosyn per pharmacy consult - follow up wound cultures - follow  up venous and arterial BLE duplex - lovenox for dvt ppx - regular diet - DNR/DNI per patient request - zofran for nausea - tylenol and ibuprofen for pain  Possible Type II diabetes Patient states that "she thinks she has diabetes" because she feels very similar to the way she did when she had gestational diabetes. Glucose 145 at admission. Will draw A1C. Can add sSSI if needed.  H/O Bipolar Disorder Patient says that she has a history of bipolar disorder. She states that she took tegretol for this at some point but had to stop due to aplastic anemia. No longer takes any medications for bipolar and states that she has been doing much better with that as she has aged. Monitor for s/s of mania or depression will treat as needed.  PMH is significant for gestational diabetes, C-Section  FEN/GI: regular Prophylaxis: lovenox  Disposition: home  History of Present Illness:  [name] is a 32 y.o. female presenting with a 6 week history of progressively worsening BLE wound. She states that she has developed small ulcers on her legs in the past but these have always spontaneously resolved without any treatment. She states that of note she was seen in [facility] emergency department "several months ago" for cellulitis of her left lower extremity. She received doxycycline which resolved her cellulitis. She states that her LLE ulcers have gotten much worse over the last week. They have started weeping and have grown in size rapidly over this time period.  She has had no other symptoms aside from a URI present for the last week. She has no PMH but did have gestational diabetes in  the past. She states that she might have diabetes but has just never been checked.  Received vanc and zosyn in ed. Wound cultures drawn prior to abx. Blood cultures also drawn. Cmp: alk phos 141. LA 2.5->1.45. Cbc wbx 10.6, hgb 10.8. UA negative. LLE xray showed distal tibia and fibular ulceration w/o subq gas, opaque foreign  bodies, or acute osseous process.  Review Of Systems: Per HPI with the following additions:   Review of Systems  Constitutional: Negative for chills and fever.  HENT: Positive for congestion. Negative for sinus pain.   Eyes: Negative for pain.  Respiratory: Positive for cough.   Cardiovascular: Positive for leg swelling. Negative for chest pain, palpitations, orthopnea and claudication.  Gastrointestinal: Negative for abdominal pain, diarrhea, nausea and vomiting.  Genitourinary: Negative for dysuria and urgency.  Musculoskeletal: Negative for myalgias.  Neurological: Negative for dizziness, weakness and headaches.    There are no active problems to display for this patient.   Past Medical History: Past Medical History:  Diagnosis Date  . Diabetes mellitus without complication ([place])    with pregnancy    Past Surgical History: Past Surgical History:  Procedure Laterality Date  . CESAREAN SECTION      Social History: Social History   Tobacco Use  . Smoking status: Current Some Day Smoker    Packs/day: 0.50    Types: Cigarettes  . Smokeless tobacco: Never Used  Substance Use Topics  . Alcohol use: No    Frequency: Never  . Drug use: No   Social: Lives with friends and family.  Please also refer to relevant sections of EMR.  Family History: No family history on file. none  Allergies and Medications: Allergies  Allergen Reactions  . Tegretol [Carbamazepine] Other (See Comments)    Causes aplastic anemia  . Ambien [Zolpidem Tartrate] Rash   No current facility-administered medications on file prior to encounter.    Current Outpatient Medications on File Prior to Encounter  Medication Sig Dispense Refill  . acetaminophen (TYLENOL) 325 MG tablet Take 650 mg by mouth every 6 (six) hours as needed for mild pain.    Marland Kitchen ibuprofen (ADVIL,MOTRIN) 200 MG tablet Take 200 mg by mouth every 6 (six) hours as needed for mild pain.      Objective: BP (!) 121/57   Pulse  87   Temp 98.6 F (37 C) (Oral)   Resp 18   Ht _0  (1.753 m)   Wt 300 lb (136.1 kg)   LMP 12/21/2016   SpO2 94%   BMI 44.30 kg/m  Exam: General: obese, caucasian female, resting comfortably in no acute distress, reading a book. Eyes: eomi, perrl, no conjunctival injection ENTM: ears with no external trauma bilaterally, MMM Neck: no cervical adenopathy Cardiovascular: rrr, no m/r/g, palpable radial pulse. Unable to palpable PT/DP bilaterally due to edema in BLE. 2+ pitting edema noted in ankles BLE. Respiratory: lungs CTAB, no chest pain Gastrointestinal: soft, non-tender, non-distended MSK: 5/5 strength all muscle groups BLE, BUE Derm: warm and dry, 2 large ulcers present in anterior distribution LLE. Weeping clear fluid. Erythema present. Neuro: cn 2-12 intact, no focal neuro deficit Psych: appropriate  Labs and Imaging: CBC BMET  Recent Labs  Lab 01/16/17 0152  WBC 10.6*  HGB 10.8*  HCT 34.3*  PLT 364   Recent Labs  Lab 01/16/17 0152  NA 136  K 3.7  CL 101  CO2 22  BUN 7  CREATININE 0.74  GLUCOSE 145*  CALCIUM 9.2  Guadalupe Dawn, MD 01/16/2017, 4:01 AM PGY-1, [place] [facility] pager: [phone], text pages welcome   FPTS Upper-Level Resident Addendum  I have the seen the patient and agree with the documentation by Dr. Kris Mouton. I have added my edits as necessary.   Rosana Berger, MD PGY-2, [place] Service pager: [phone] (text pages welcome through [facility])

## 2017-01-16 NOTE — Anesthesia Preprocedure Evaluation (Addendum)
Anesthesia Evaluation  Patient identified by MRN, date of birth, ID band Patient awake    Reviewed: Allergy & Precautions, NPO status , Patient's Chart, lab work & pertinent test results  Airway Mallampati: II  TM Distance: >3 FB Neck ROM: Full    Dental  (+) Dental Advisory Given   Pulmonary Current Smoker,    Pulmonary exam normal breath sounds clear to auscultation       Cardiovascular negative cardio ROS Normal cardiovascular exam Rhythm:Regular Rate:Normal     Neuro/Psych negative neurological ROS  negative psych ROS   GI/Hepatic negative GI ROS, Neg liver ROS,   Endo/Other  diabetesMorbid obesity  Renal/GU negative Renal ROS  negative genitourinary   Musculoskeletal negative musculoskeletal ROS (+)   Abdominal (+) + obese,   Peds  Hematology  (+) anemia ,   Anesthesia Other Findings   Reproductive/Obstetrics                             Anesthesia Physical Anesthesia Plan  ASA: III  Anesthesia Plan: General   Post-op Pain Management:    Induction: Intravenous  PONV Risk Score and Plan: 3 and Treatment may vary due to age or medical condition, Ondansetron, Midazolam and Scopolamine patch - Pre-op  Airway Management Planned: LMA  Additional Equipment: None  Intra-op Plan:   Post-operative Plan: Extubation in OR  Informed Consent: I have reviewed the patients History and Physical, chart, labs and discussed the procedure including the risks, benefits and alternatives for the proposed anesthesia with the patient or authorized representative who has indicated his/her understanding and acceptance.   Dental advisory given  Plan Discussed with: CRNA  Anesthesia Plan Comments: (Discussed DNR listed in chart with patient. She states that she "doesn't want to be a vegetable", but would want [place] to "do everything" to revive her, including CPR and intubation.)       Anesthesia  Quick Evaluation

## 2017-01-16 NOTE — Anesthesia Postprocedure Evaluation (Signed)
Anesthesia Post Note  Patient: [name]  Procedure(s) Performed: DEBRIDEMENT OF LEFT ANTERIOR AND POSTERLATERAL TIBIAL WOUNDS (Left Leg Lower) APPLICATION OF WOUND VAC (Left Leg Lower)     Patient location during evaluation: PACU Anesthesia Type: General Level of consciousness: awake and alert Pain management: pain level controlled Vital Signs Assessment: post-procedure vital signs reviewed and stable Respiratory status: spontaneous breathing, nonlabored ventilation, respiratory function stable and patient connected to nasal cannula oxygen Cardiovascular status: blood pressure returned to baseline and stable Postop Assessment: no apparent nausea or vomiting Anesthetic complications: no    Last Vitals:  Vitals:   01/16/17 1820 01/16/17 2157  BP: 119/69 138/76  Pulse: (!) 108 (!) 107  Resp: 15 14  Temp: 36.9 C 37.1 C  SpO2: 95% 93%    Last Pain:  Vitals:   01/16/17 2157  TempSrc: Oral  PainSc:                  Audry Pili

## 2017-01-16 NOTE — Progress Notes (Signed)
Vital signs taken on arrival to room 6n15 from pacu

## 2017-01-16 NOTE — Consult Note (Signed)
[facility] nurse consulted for LLE wounds however after review of the chart and images it is noted that orthopedics has been consulted and is recommending OR debridement. WOC will order conservative topical care for now until decision made about surgery.   Would recommend biopsy at the time of surgical debridement if possible, with punched out appearance and depth would be good to have clear etiology for long term follow up.  [place], [place], [place]

## 2017-01-16 NOTE — Brief Op Note (Signed)
01/16/2017  5:33 PM  PATIENT:  [name]  32 y.o. female  PRE-OPERATIVE DIAGNOSIS:  WOUNDS  POST-OPERATIVE DIAGNOSIS:  * No post-op diagnosis entered *  PROCEDURE:  Procedure(s): DEBRIDEMENT OF LEFT TIBIA WOUND (Left)  SURGEON:  Surgeon(s) and Role:    Dorna Leitz, MD - Primary  PHYSICIAN ASSISTANT:   ASSISTANTS: bethune   ANESTHESIA:   general  EBL: minimal  BLOOD ADMINISTERED:none  DRAINS: none   LOCAL MEDICATIONS USED:  NONE  SPECIMEN:  Source of Specimen:  l lower ext wounds.  Scrapings were sent with culture swabs for Gram stain, culture, AFB, and fungal cultures.  Multiple punch biopsies were sent to pathology for tissue evaluation.  DISPOSITION OF SPECIMEN:  PATHOLOGY  COUNTS:  YES  TOURNIQUET:  * No tourniquets in log *  DICTATION: .Other Dictation: Dictation Number [id number]  PLAN OF CARE: Admit to inpatient   PATIENT DISPOSITION:  PACU - hemodynamically stable.   Delay start of Pharmacological VTE agent (>24hrs) due to surgical blood loss or risk of bleeding: no

## 2017-01-16 NOTE — Progress Notes (Signed)
Pt new admit from ED, alert and oriented  With left leg ulcer with dressing, left forearm saline lock, room air, independent, for ultrasound of the left lower leg and EKG.

## 2017-01-16 NOTE — Progress Notes (Signed)
VASCULAR LAB PRELIMINARY  ARTERIAL  ABI completed:Right ABI and waveforms are within normal limits. Unable to ascertain left ABI secondary to severe ulceration and pain, but waveforms are normal.     RIGHT    LEFT    PRESSURE WAVEFORM  PRESSURE WAVEFORM  BRACHIAL 155 T BRACHIAL 167 T  DP   DP    AT 152 T AT  T  PT 175 T PT  T  PER   PER    GREAT TOE  NA GREAT TOE  NA    RIGHT LEFT  ABI 1.07      Alyjah Lovingood, RVT 01/16/2017, 8:25 AM

## 2017-01-16 NOTE — Progress Notes (Signed)
Pharmacy Antibiotic Note  [name] is a 32 y.o. female admitted on 01/16/2017 with LLE wounds with drainage and started on vancomycin.  Pharmacy now consulted to continue Zosyn.  Patient is afebrile and her WBC is mildly elevated at 10.8.  SCr 0.74 on admit.   Plan: Zosyn EID 3.375gm IV Q8H Continue vanc 1gm IV Q8H Monitor renal fxn, micro data to narrow abx, vanc trough if indicated Consider increasing Lovenox to 65mg  SQ Q24H for BMI > 30   Height: 5\' 9"  (175.3 cm) Weight: 300 lb (136.1 kg) IBW/kg (Calculated) : 66.2  Temp (24hrs), Avg:98.5 F (36.9 C), Min:98.3 F (36.8 C), Max:98.6 F (37 C)  Recent Labs  Lab 01/16/17 0152 01/16/17 0215 01/16/17 0509  WBC 10.6*  --   --   CREATININE 0.74  --   --   LATICACIDVEN  --  2.50* 1.45    Estimated Creatinine Clearance: 148.7 mL/min (by C-G formula based on SCr of 0.74 mg/dL).    Allergies  Allergen Reactions  . Tegretol [Carbamazepine] Other (See Comments)    Causes aplastic anemia  . Ambien [Zolpidem Tartrate] Rash    Vanc 1/14 >> Zosyn 1/14 >>  1/14 BCx -  1/14 left leg wound cx - GPC/GNR/GPR on Gram stain   Liliana Brentlinger D. Mina Marble, PharmD, BCPS Pager:  [phone] 01/16/2017, 10:19 AM

## 2017-01-16 NOTE — Care Management Note (Signed)
Case Management Note  Patient Details  Name: Cornisha Zetino MRN: [id number] Date of Birth: Aug 27, 1985  Subjective/Objective:                    Action/Plan:  Consult for homeless, medication assistance, wound care.  Attempted to visit with patient, was off unit .   Consulted social work also.   Will discuss [place] letter , wound care center , follow up appointment at [facility] or [facility].  Continue to follow. Expected Discharge Date:                  Expected Discharge Plan:     In-House Referral:  Clinical Social Work, Scientist, research (medical)  CM Consult, [facility], [place], Medication Assistance, Other - See comment  Post Acute Care Choice:    Choice offered to:     DME Arranged:    DME Agency:     HH Arranged:    [place] Agency:     Status of Service:  In process, will continue to follow  If discussed at Long Length of Stay Meetings, dates discussed:    Additional Comments:  Marilu Favre, RN 01/16/2017, 1:10 PM

## 2017-01-16 NOTE — Care Management (Addendum)
Patient currently not in room.   Home VAC form  And [place] DMA form placed in  shadow chart drawer for MD signature.   Magdalen Spatz RN BSN [phone]

## 2017-01-16 NOTE — ED Notes (Signed)
CODE SEPSIS ACTIVATED RN KAREN AWARE

## 2017-01-16 NOTE — Progress Notes (Signed)
Pharmacy Antibiotic Note  [name] is a 32 y.o. female admitted on 01/16/2017 with LLE wounds with drainage. Starting empiric abx for wound infection. SCr 0.7, LA 2.5.    Plan: -Vancomycin 2500 mg IV x1 then 1g/8h -Monitor renal fx, cultures -Check VT at Css    Height: 5\' 9"  (175.3 cm) Weight: 300 lb (136.1 kg) IBW/kg (Calculated) : 66.2  Temp (24hrs), Avg:98.6 F (37 C), Min:98.6 F (37 C), Max:98.6 F (37 C)  Recent Labs  Lab 01/16/17 0152 01/16/17 0215  WBC 10.6*  --   CREATININE 0.74  --   LATICACIDVEN  --  2.50*    Estimated Creatinine Clearance: 148.7 mL/min (by C-G formula based on SCr of 0.74 mg/dL).    Allergies  Allergen Reactions  . Tegretol [Carbamazepine] Other (See Comments)    Causes aplastic anemia  . Ambien [Zolpidem Tartrate] Rash    Antimicrobials this admission: 1/14 vancomycin > 1/14 zosyn x1  Dose adjustments this admission: N/A  Microbiology results: 1/14 blood cx: 1/14 wound cx:  Harvel Quale 01/16/2017 3:00 AM

## 2017-01-16 NOTE — Transfer of Care (Signed)
Immediate Anesthesia Transfer of Care Note  Patient: [name]  Procedure(s) Performed: DEBRIDEMENT OF LEFT TIBIA WOUND (Left )  Patient Location: PACU  Anesthesia Type:General  Level of Consciousness: awake, alert , oriented and patient cooperative  Airway & Oxygen Therapy: Patient Spontanous Breathing  Post-op Assessment: Report given to RN and Post -op Vital signs reviewed and stable  Post vital signs: Reviewed and stable  Last Vitals:  Vitals:   01/16/17 1509 01/16/17 1753  BP: (!) 147/78   Pulse: 99 (!) 120  Resp: 20 13  Temp: 36.8 C (!) 36.1 C  SpO2: 98% 98%    Last Pain:  Vitals:   01/16/17 1753  TempSrc:   PainSc: 10-Worst pain ever         Complications: No apparent anesthesia complications

## 2017-01-16 NOTE — Consult Note (Signed)
Reason for Consult:l lower ext wounds Referring Physician: internal medicine  [name] is an 32 y.o. female.  HPI: 32 yo femalw who has hx of multiple lower ext "small wounds" that come up and go away.  She had one come up several weeks ago and has continued to get worse and has opened up multiple wounds on her leg.  We are consulted for evaluation and management.   She denies IV drug abuse for 2 years and has not had other wounds of this nature.  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 Past Medical History:  Diagnosis Date  . Diabetes mellitus without complication ([place])    with pregnancy    Past Surgical History:  Procedure Laterality Date  . CESAREAN SECTION      No family history on file.  Social History:  reports that she has been smoking cigarettes.  She has been smoking about 0.50 packs per day. she has never used smokeless tobacco. She reports that she does not drink alcohol or use drugs.  Allergies:  Allergies  Allergen Reactions  . Tegretol [Carbamazepine] Other (See Comments)    Causes aplastic anemia  . Ambien [Zolpidem Tartrate] Rash    Medications: I have reviewed the patient's current medications.  Results for orders placed or performed during the hospital encounter of 01/16/17 (from the past 48 hour(s))  Urinalysis, Routine w reflex microscopic     Status: Abnormal   Collection Time: 01/16/17  1:49 AM  Result Value Ref Range   Color, Urine YELLOW YELLOW   APPearance HAZY (A) CLEAR   Specific Gravity, Urine 1.040 (H) 1.005 - 1.030   pH 5.0 5.0 - 8.0   Glucose, UA NEGATIVE NEGATIVE mg/dL   Hgb urine dipstick NEGATIVE NEGATIVE   Bilirubin Urine NEGATIVE NEGATIVE   Ketones, ur NEGATIVE NEGATIVE mg/dL   Protein, ur NEGATIVE NEGATIVE mg/dL   Nitrite NEGATIVE NEGATIVE   Leukocytes, UA NEGATIVE NEGATIVE  Comprehensive metabolic panel     Status: Abnormal   Collection Time: 01/16/17  1:52 AM  Result Value Ref Range   Sodium 136 135 - 145 mmol/L   Potassium 3.7 3.5 - 5.1 mmol/L   Chloride 101 101 - 111 mmol/L   CO2 22 22 - 32 mmol/L   Glucose, Bld 145 (H) 65 - 99 mg/dL   BUN 7 6 - 20 mg/dL   Creatinine, Ser 0.74 0.44 - 1.00 mg/dL   Calcium 9.2 8.9 - 10.3 mg/dL   Total Protein 7.9 6.5 - 8.1 g/dL   Albumin 3.9 3.5 - 5.0 g/dL   AST 31 15 - 41 U/L   ALT 39 14 - 54 U/L   Alkaline Phosphatase 141 (H) 38 - 126 U/L   Total Bilirubin 0.6 0.3 - 1.2 mg/dL   GFR calc non Af Amer >60 >60 mL/min   GFR calc Af Amer >60 >60 mL/min    Comment: (NOTE) The eGFR has been calculated using the CKD EPI equation. This calculation has not been validated in all clinical situations. eGFR's persistently <60 mL/min signify possible Chronic Kidney Disease.    Anion gap 13 5 - 15  CBC with Differential     Status: Abnormal   Collection Time: 01/16/17  1:52 AM  Result Value Ref Range   WBC 10.6 (H) 4.0 - 10.5 K/uL   RBC 4.43 3.87 - 5.11 MIL/uL   Hemoglobin 10.8 (L) 12.0 - 15.0 g/dL   HCT 34.3 (L) 36.0 -  46.0 %   MCV 77.4 (L) 78.0 - 100.0 fL   MCH 24.4 (L) 26.0 - 34.0 pg   MCHC 31.5 30.0 - 36.0 g/dL   RDW 15.4 11.5 - 15.5 %   Platelets 364 150 - 400 K/uL   Neutrophils Relative % 65 %   Neutro Abs 6.9 1.7 - 7.7 K/uL   Lymphocytes Relative 24 %   Lymphs Abs 2.6 0.7 - 4.0 K/uL   Monocytes Relative 6 %   Monocytes Absolute 0.6 0.1 - 1.0 K/uL   Eosinophils Relative 4 %   Eosinophils Absolute 0.5 0.0 - 0.7 K/uL   Basophils Relative 1 %   Basophils Absolute 0.1 0.0 - 0.1 K/uL  Protime-INR     Status: None   Collection Time: 01/16/17  1:52 AM  Result Value Ref Range   Prothrombin Time 14.3 11.4 - 15.2 seconds   INR 1.12   I-Stat beta hCG blood, ED     Status: None   Collection Time: 01/16/17  2:12 AM  Result Value Ref Range   I-stat hCG, quantitative <5.0 <5 mIU/mL   Comment 3            Comment:   GEST. AGE      CONC.  (mIU/mL)   <=1 WEEK        5 - 50     2 WEEKS       50 - 500     3 WEEKS       100 - 10,000      4 WEEKS     1,000 - 30,000        FEMALE AND NON-PREGNANT FEMALE:     LESS THAN 5 mIU/mL   I-Stat CG4 Lactic Acid, ED     Status: Abnormal   Collection Time: 01/16/17  2:15 AM  Result Value Ref Range   Lactic Acid, Venous 2.50 (HH) 0.5 - 1.9 mmol/L  Wound or Superficial Culture     Status: None (Preliminary result)   Collection Time: 01/16/17  3:10 AM  Result Value Ref Range   Specimen Description WOUND LEFT LEG    Special Requests NONE    Gram Stain      RARE WBC PRESENT, PREDOMINANTLY PMN MODERATE GRAM POSITIVE COCCI IN PAIRS MODERATE GRAM NEGATIVE RODS RARE GRAM POSITIVE RODS    Culture PENDING    Report Status PENDING   I-Stat CG4 Lactic Acid, ED     Status: None   Collection Time: 01/16/17  5:09 AM  Result Value Ref Range   Lactic Acid, Venous 1.45 0.5 - 1.9 mmol/L  Hemoglobin A1c     Status: Abnormal   Collection Time: 01/16/17  5:28 AM  Result Value Ref Range   Hgb A1c MFr Bld 6.5 (H) 4.8 - 5.6 %    Comment: (NOTE) Pre diabetes:          5.7%-6.4% Diabetes:              >6.4% Glycemic control for   <7.0% adults with diabetes    Mean Plasma Glucose 139.85 mg/dL    Dg Tibia/fibula Left  Result Date: 01/16/2017 CLINICAL DATA:  LEFT lower extremity drainage with venous stasis ulcers. EXAM: LEFT TIBIA AND FIBULA - 2 VIEW COMPARISON:  None. FINDINGS: There is no evidence of fracture or other focal bone lesions. Soft tissue swelling with multiple skin defects in the anterolateral distal tibiofibular soft tissues. No subcutaneous gas or radiopaque foreign bodies. IMPRESSION: Distal tibia and fibular ulceration without  subcutaneous gas, radiopaque foreign bodies or acute osseous process. Electronically Signed   By: Elon Alas M.D.   On: 01/16/2017 03:16   Mr Tibia Fibula Left W Wo Contrast  Result Date: 01/16/2017 CLINICAL DATA:  The patient noticed a small wound on the left shin 6 weeks ago, cause unknown. Pain and swelling of the left lower leg of worsened since  she noticed the wound. EXAM: MRI OF LOWER LEFT EXTREMITY WITHOUT AND WITH CONTRAST TECHNIQUE: Multiplanar, multisequence MR imaging of the left lower extremity was performed both before and after administration of intravenous contrast. CONTRAST:  20 ml MULTIHANCE GADOBENATE DIMEGLUMINE 529 MG/ML IV SOLN COMPARISON:  Plain films left lower leg 01/16/2017. FINDINGS: The axial and coronal sequences incidentally include the right lower leg. Bones/Joint/Cartilage No marrow signal abnormality or enhancement to suggest osteomyelitis is identified. No fracture, dislocation or focal bony lesion. Mildly decreased T1 and increased T2 signal in the tibia is symmetric from right to left and most consistent with marrow reactivation related to obesity and smoking. Ligaments Intact. Muscles and Tendons Normal signal bilaterally without infectious or inflammatory change. No intramuscular fluid collection. Soft tissues Skin wounds are seen in the anterior aspect of the left lower leg. Subcutaneous edema and enhancement are seen deep to the wounds most consistent with cellulitis. There is mild edema and enhancement in the superficial fascia of the tibialis anterior deep to the skin wound. No soft tissue abscess. IMPRESSION: Skin wounds on the anterior aspect of the left lower leg with changes of underlying cellulitis. Small area of edema and enhancement in superficial fascia of the tibialis anterior is compatible with fasciitis. Negative for abscess, septic joint or osteomyelitis. Electronically Signed   By: Inge Rise M.D.   On: 01/16/2017 13:26    ROS  ROS: I have reviewed the patient's review of systems thoroughly and there are no positive responses as relates to the HPI. Blood pressure (!) 115/58, pulse 88, temperature 98.3 F (36.8 C), temperature source Oral, resp. rate 20, height 5' 9"  (1.753 m), weight 136.1 kg (300 lb), last menstrual period 12/21/2016, SpO2 99 %. Physical Exam Well-developed well-nourished  patient in no acute distress. Alert and oriented x3 HEENT:within normal limits Cardiac: Regular rate and rhythm Pulmonary: Lungs clear to auscultation Abdomen: Soft and nontender.  Normal active bowel sounds  Musculoskeletal: r leg has 1X1 cm necrotic wound and l lower ext has multiple open wounds outlined by picture in chart.  Several 1cm wound and 1 3X4 cm wound.i Assessment/Plan: 32 yo femakle with multiple infected open wounds who needs I@D  and wound vac placement.  I have discussed the surgery as well as risks associated including bleeding, infection, need for addditional surgery and risk of death around the time of surgery.  She understands and wishes to proceed.  I have discussed the case with Dr. Marla Roe and she agrees with the treatment and feels she may be a candidate for A-CELL once the wounds clean up.  Also I will ask infectious disease to see her as well for diagnostic purposes.  Alta Corning 01/16/2017, 2:23 PM

## 2017-01-16 NOTE — ED Provider Notes (Signed)
[facility] EMERGENCY DEPARTMENT Provider Note   CSN: [id number] Arrival date & time: 01/16/17  0132     History   Chief Complaint Chief Complaint  Patient presents with  . Wound Check    HPI [name] is a 32 y.o. female.  [name] is a 32 y.o. Female who presents the emergency department complaining of wounds to her left lower extremity that have gradually worsened over the past 6 weeks.  Patient reports she first noticed the wounds that were growing in size about 6 weeks ago to her left lower extremity.  She reports she has had them before, but usually they resolve on their own.  She reports these have gradually grown in size and she is developed purulent drainage from the area.  She also suspects she has diabetes that is not diagnosed.  She is uninsured and not followed by primary care. She reports she works at [employer] for 60 hours a week and is homeless.   She tells me she has had urinary frequency for a long time and suspects this is due to diabetes.  She denies any fevers.  No treatments attempted prior to arrival. She denies fevers, chest pain, coughing, shortness of breath, abdominal pain, nausea, vomiting, diarrhea, dysuria, numbness, tingling, weakness.    The history is provided by the patient and medical records. No language interpreter was used.    Past Medical History:  Diagnosis Date  . Diabetes mellitus without complication ([place])    with pregnancy    There are no active problems to display for this patient.   Past Surgical History:  Procedure Laterality Date  . CESAREAN SECTION      OB History    No data available       Home Medications    Prior to Admission medications   Medication Sig Start Date End Date Taking? Authorizing Provider  acetaminophen (TYLENOL) 325 MG tablet Take 650 mg by mouth every 6 (six) hours as needed for mild pain.   Yes [provider]  ibuprofen (ADVIL,MOTRIN) 200 MG tablet Take 200  mg by mouth every 6 (six) hours as needed for mild pain.   Yes [provider]    Family History No family history on file.  Social History Social History   Tobacco Use  . Smoking status: Current Some Day Smoker    Packs/day: 0.50    Types: Cigarettes  . Smokeless tobacco: Never Used  Substance Use Topics  . Alcohol use: No    Frequency: Never  . Drug use: No     Allergies   Tegretol [carbamazepine] and Ambien [zolpidem tartrate]   Review of Systems Review of Systems  Constitutional: Negative for chills and fever.  HENT: Negative for congestion and sore throat.   Eyes: Negative for visual disturbance.  Respiratory: Negative for cough and shortness of breath.   Cardiovascular: Negative for chest pain.  Gastrointestinal: Negative for abdominal pain, diarrhea, nausea and vomiting.  Endocrine: Positive for polydipsia, polyphagia and polyuria.  Genitourinary: Positive for frequency. Negative for difficulty urinating and dysuria.  Musculoskeletal: Negative for back pain and neck pain.  Skin: Positive for color change, rash and wound.  Neurological: Negative for weakness, numbness and headaches.  Psychiatric/Behavioral: The patient is nervous/anxious.      Physical Exam Updated Vital Signs BP (!) 121/57   Pulse 87   Temp 98.6 F (37 C) (Oral)   Resp 18   Ht 5\' 9"  (1.753 m)   Wt 136.1  kg (300 lb)   LMP 12/21/2016   SpO2 94%   BMI 44.30 kg/m   Physical Exam  Constitutional: She is oriented to person, place, and time. She appears well-developed and well-nourished. No distress.  Nontoxic-appearing.  HENT:  Head: Normocephalic and atraumatic.  Mouth/Throat: Oropharynx is clear and moist.  Eyes: Conjunctivae are normal. Pupils are equal, round, and reactive to light. Right eye exhibits no discharge. Left eye exhibits no discharge.  Neck: Neck supple.  Cardiovascular: Regular rhythm, normal heart sounds and intact distal pulses. Exam reveals no gallop and no  friction rub.  No murmur heard. Bilateral dorsalis pedis and posterior tibialis pulses are intact.  Heart rate is 112 on exam.  Pulmonary/Chest: Effort normal and breath sounds normal. No respiratory distress. She has no wheezes. She has no rales.  Abdominal: Soft. There is no tenderness. There is no guarding.  Musculoskeletal: She exhibits no edema or deformity.  Cratered ulcers noted to her left shin with purulent drainage. See attached pictures.   Lymphadenopathy:    She has no cervical adenopathy.  Neurological: She is alert and oriented to person, place, and time. No sensory deficit. Coordination normal.  Skin: Skin is warm and dry. Capillary refill takes less than 2 seconds. Rash noted. She is not diaphoretic. There is erythema. No pallor.  See musculoskeletal and attached pictures.   Psychiatric: Her behavior is normal. Her mood appears anxious.  Nursing note and vitals reviewed.        ED Treatments / Results  Labs (all labs ordered are listed, but only abnormal results are displayed) Labs Reviewed  COMPREHENSIVE METABOLIC PANEL - Abnormal; Notable for the following components:      Result Value   Glucose, Bld 145 (*)    Alkaline Phosphatase 141 (*)    All other components within normal limits  CBC WITH DIFFERENTIAL/PLATELET - Abnormal; Notable for the following components:   WBC 10.6 (*)    Hemoglobin 10.8 (*)    HCT 34.3 (*)    MCV 77.4 (*)    MCH 24.4 (*)    All other components within normal limits  URINALYSIS, ROUTINE W REFLEX MICROSCOPIC - Abnormal; Notable for the following components:   APPearance HAZY (*)    Specific Gravity, Urine 1.040 (*)    All other components within normal limits  I-STAT CG4 LACTIC ACID, ED - Abnormal; Notable for the following components:   Lactic Acid, Venous 2.50 (*)    All other components within normal limits  AEROBIC CULTURE (SUPERFICIAL SPECIMEN)  CULTURE, BLOOD (ROUTINE X 2)  CULTURE, BLOOD (ROUTINE X 2)  PROTIME-INR    I-STAT BETA HCG BLOOD, ED ([facility], [facility], [facility] ONLY)  I-STAT CG4 LACTIC ACID, ED    EKG  EKG Interpretation None       Radiology Dg Tibia/fibula Left  Result Date: 01/16/2017 CLINICAL DATA:  LEFT lower extremity drainage with venous stasis ulcers. EXAM: LEFT TIBIA AND FIBULA - 2 VIEW COMPARISON:  None. FINDINGS: There is no evidence of fracture or other focal bone lesions. Soft tissue swelling with multiple skin defects in the anterolateral distal tibiofibular soft tissues. No subcutaneous gas or radiopaque foreign bodies. IMPRESSION: Distal tibia and fibular ulceration without subcutaneous gas, radiopaque foreign bodies or acute osseous process. Electronically Signed   By: Elon Alas M.D.   On: 01/16/2017 03:16    Procedures .Critical Care Performed by: Waynetta Pean, PA-C Authorized by: Waynetta Pean, PA-C      CRITICAL CARE Performed by: Hanley Hays  Total critical care time: 35 minutes  Critical care time was exclusive of separately billable procedures and treating other patients.  Critical care was necessary to treat or prevent imminent or life-threatening deterioration.  Critical care was time spent personally by me on the following activities: development of treatment plan with patient and/or surrogate as well as nursing, discussions with consultants, evaluation of patient's response to treatment, examination of patient, obtaining history from patient or surrogate, ordering and performing treatments and interventions, ordering and review of laboratory studies, ordering and review of radiographic studies, pulse oximetry and re-evaluation of patient's condition.   Medications Ordered in ED Medications  vancomycin (VANCOCIN) 2,500 mg in sodium chloride 0.9 % 500 mL IVPB (2,500 mg Intravenous New Bag/Given 01/16/17 0338)  vancomycin (VANCOCIN) IVPB 1000 mg/200 mL premix (not administered)  sodium chloride 0.9 % bolus 1,000 mL (0 mLs Intravenous Stopped 01/16/17  0338)  piperacillin-tazobactam (ZOSYN) IVPB 3.375 g (0 g Intravenous Stopped 01/16/17 0338)     Initial Impression / Assessment and Plan / ED Course  I have reviewed the triage vital signs and the nursing notes.  Pertinent labs & imaging results that were available during my care of the patient were reviewed by me and considered in my medical decision making (see chart for details).    This  is a 32 y.o. Female who presents the emergency department complaining of wounds to her left lower extremity that have gradually worsened over the past 6 weeks.  Patient reports she first noticed the wounds that were growing in size about 6 weeks ago to her left lower extremity.  She reports she has had them before, but usually they resolve on their own.  She reports these have gradually grown in size and she is developed purulent drainage from the area.  She also suspects she has diabetes that is not diagnosed.  She is uninsured and not followed by primary care. She reports she works at [employer] for 60 hours a week and is homeless.   She tells me she has had urinary frequency for a long time and suspects this is due to diabetes.  On exam the patient is afebrile nontoxic-appearing.  She has several crater-like ulcers noted to her left lower leg.  These appear to be infected venous stasis ulcers that are down to muscle.  Purulent drainage noted.  Surrounding erythema. Wound culture obtained.  Will start vancomycin and Zosyn and obtain blood work and x-ray. Lactic acid is elevated 2.50.  Code sepsis activated.  Zosyn and vancomycin already ordered. Pregnancy test is negative.  CMP is remarkable for a glucose of 145.  CBC with leukocytosis with a white count of 10,600. X-ray of her left tibia and fibula show ulceration without subcutaneous gas or radiopaque foreign body or acute osseous process. Will plan on admission for IV antibiotics and case management consult. Patient agrees with plan for admission.   I  consulted with family medicine teaching service who accepted the patient for admission.   This patient was discussed with and evaluated by Dr. Roxanne Mins who agrees with assessment and plan.    Final Clinical Impressions(s) / ED Diagnoses   Final diagnoses:  Cellulitis of left lower extremity  Venous stasis ulcer of other part of left lower leg with fat layer exposed, unspecified whether varicose veins present ([place])  Sepsis, due to unspecified organism [facility])    ED Discharge Orders    None       Waynetta Pean, PA-C 01/16/17 6195  Delora Fuel, MD 70/34/03 [phone]

## 2017-01-16 NOTE — Anesthesia Procedure Notes (Signed)
Procedure Name: LMA Insertion Date/Time: 01/16/2017 4:40 PM Performed by: White, Amedeo Plenty, CRNA Pre-anesthesia Checklist: Patient identified, Emergency Drugs available, Suction available and Patient being monitored Patient Re-evaluated:Patient Re-evaluated prior to induction Oxygen Delivery Method: Circle System Utilized Preoxygenation: Pre-oxygenation with 100% oxygen Induction Type: IV induction Ventilation: Mask ventilation without difficulty LMA: LMA inserted LMA Size: 5.0 Number of attempts: 1 Placement Confirmation: positive ETCO2 Tube secured with: Tape Dental Injury: Teeth and Oropharynx as per pre-operative assessment

## 2017-01-17 ENCOUNTER — Encounter (HOSPITAL_COMMUNITY): Payer: Self-pay | Admitting: Orthopedic Surgery

## 2017-01-17 DIAGNOSIS — Z8742 Personal history of other diseases of the female genital tract: Secondary | ICD-10-CM

## 2017-01-17 DIAGNOSIS — D509 Iron deficiency anemia, unspecified: Secondary | ICD-10-CM | POA: Diagnosis present

## 2017-01-17 DIAGNOSIS — E11622 Type 2 diabetes mellitus with other skin ulcer: Secondary | ICD-10-CM

## 2017-01-17 DIAGNOSIS — S81802A Unspecified open wound, left lower leg, initial encounter: Secondary | ICD-10-CM | POA: Diagnosis present

## 2017-01-17 DIAGNOSIS — L97919 Non-pressure chronic ulcer of unspecified part of right lower leg with unspecified severity: Secondary | ICD-10-CM | POA: Diagnosis present

## 2017-01-17 DIAGNOSIS — S81802D Unspecified open wound, left lower leg, subsequent encounter: Secondary | ICD-10-CM | POA: Diagnosis present

## 2017-01-17 DIAGNOSIS — Z8659 Personal history of other mental and behavioral disorders: Secondary | ICD-10-CM

## 2017-01-17 DIAGNOSIS — F17210 Nicotine dependence, cigarettes, uncomplicated: Secondary | ICD-10-CM

## 2017-01-17 DIAGNOSIS — E119 Type 2 diabetes mellitus without complications: Secondary | ICD-10-CM

## 2017-01-17 DIAGNOSIS — F1990 Other psychoactive substance use, unspecified, uncomplicated: Secondary | ICD-10-CM | POA: Diagnosis present

## 2017-01-17 DIAGNOSIS — L0293 Carbuncle, unspecified: Secondary | ICD-10-CM | POA: Diagnosis present

## 2017-01-17 DIAGNOSIS — L97929 Non-pressure chronic ulcer of unspecified part of left lower leg with unspecified severity: Secondary | ICD-10-CM

## 2017-01-17 LAB — CBC
HCT: 33 % — ABNORMAL LOW (ref 36.0–46.0)
Hemoglobin: 10 g/dL — ABNORMAL LOW (ref 12.0–15.0)
MCH: 23.9 pg — AB (ref 26.0–34.0)
MCHC: 30.3 g/dL (ref 30.0–36.0)
MCV: 78.8 fL (ref 78.0–100.0)
PLATELETS: 357 10*3/uL (ref 150–400)
RBC: 4.19 MIL/uL (ref 3.87–5.11)
RDW: 15.5 % (ref 11.5–15.5)
WBC: 8.6 10*3/uL (ref 4.0–10.5)

## 2017-01-17 LAB — COMPREHENSIVE METABOLIC PANEL
ALT: 29 U/L (ref 14–54)
AST: 21 U/L (ref 15–41)
Albumin: 3.3 g/dL — ABNORMAL LOW (ref 3.5–5.0)
Alkaline Phosphatase: 110 U/L (ref 38–126)
Anion gap: 12 (ref 5–15)
BUN: 5 mg/dL — ABNORMAL LOW (ref 6–20)
CHLORIDE: 101 mmol/L (ref 101–111)
CO2: 26 mmol/L (ref 22–32)
Calcium: 9.2 mg/dL (ref 8.9–10.3)
Creatinine, Ser: 0.63 mg/dL (ref 0.44–1.00)
Glucose, Bld: 131 mg/dL — ABNORMAL HIGH (ref 65–99)
POTASSIUM: 4.1 mmol/L (ref 3.5–5.1)
Sodium: 139 mmol/L (ref 135–145)
Total Bilirubin: 0.3 mg/dL (ref 0.3–1.2)
Total Protein: 7.3 g/dL (ref 6.5–8.1)

## 2017-01-17 LAB — GAMMA GT: GGT: 118 U/L — AB (ref 7–50)

## 2017-01-17 MED ORDER — ENOXAPARIN SODIUM 80 MG/0.8ML ~~LOC~~ SOLN
0.5000 mg/kg | SUBCUTANEOUS | Status: DC
  Administered 2017-01-17 – 2017-01-21 (×5): 70 mg via SUBCUTANEOUS
  Filled 2017-01-17 (×5): qty 0.8

## 2017-01-17 MED ORDER — VANCOMYCIN HCL IN DEXTROSE 1-5 GM/200ML-% IV SOLN
1000.0000 mg | Freq: Three times a day (TID) | INTRAVENOUS | Status: DC
  Administered 2017-01-17 – 2017-01-18 (×4): 1000 mg via INTRAVENOUS
  Filled 2017-01-17 (×5): qty 200

## 2017-01-17 NOTE — Consult Note (Signed)
[place] for Infectious Disease    Date of Admission:  01/16/2017            Day 2 vancomycin        Day 2 piperacillin tazobactam       Reason for Consult: Bilateral lower leg ulcers    Referring Provider: Dr. Dorna Leitz  Assessment: Given that the lesions on her right leg are old and healing and that I cannot see the lesions on her left leg it is hard to know what might be causing them.  She may have some polymicrobial wound infection.  She believes she has improved with antibiotics.  I will continue them pending results of final cultures, stains and pathology.  Plan: 1. Continue current antibiotics  Principal Problem:   Ulcers of both lower legs (HCC) Active Problems:   Diabetes mellitus (HCC)   History of endometriosis   Microcytic anemia   Cigarette smoker   History of bipolar disorder   Recurrent boils   IVDU (intravenous drug user)   Scheduled Meds: . aspirin  325 mg Oral Daily  . enoxaparin (LOVENOX) injection  0.5 mg/kg Subcutaneous Q24H  . Influenza vac split quadrivalent PF  0.5 mL Intramuscular Tomorrow-1000   Continuous Infusions: . lactated ringers 10 mL/hr at 01/17/17 0500  . piperacillin-tazobactam (ZOSYN)  IV 3.375 g (01/17/17 1012)  . vancomycin 1,000 mg (01/17/17 1138)   PRN Meds:.acetaminophen **OR** acetaminophen, HYDROmorphone (DILAUDID) injection, ibuprofen, methocarbamol, ondansetron **OR** ondansetron (ZOFRAN) IV, oxyCODONE-acetaminophen  HPI: [name] is a 32 y.o. female who has had problems with recurrent leg ulcers for the past year.  She also has a history of recurrent boils that started as a young girl.  She has been to the emergency department at [facility] on 2 occasions in the past year.  On one she was treated for cellulitis.  On the other occasion she was told that she had impetigo.  She believes that her current problem is different.  The ulcers she has had for the past year do not begin like the boils to.   He says they start out as a small hole then gradually enlarge. Up until recently these ulcers would resolve spontaneously.  However she recently developed an ulcer on her left lower leg that has gotten progressively larger leading to admission.  Gram stain of wound drainage has showed mixed bacterial flora.  Cultures are growing gram-negative rods.  She underwent biopsy yesterday.  Stains, cultures and pathology report are pending.  She has a history of injecting drug use but quit 2 years ago with the help of Suboxone which she has now weaned herself off of.   Review of Systems: Review of Systems  Constitutional: Negative for chills, diaphoresis, fever and weight loss.  HENT: Negative for congestion and sore throat.   Respiratory: Negative for cough, sputum production and shortness of breath.   Cardiovascular: Negative for chest pain.  Gastrointestinal: Negative for abdominal pain, diarrhea, nausea and vomiting.  Genitourinary: Negative for dysuria.  Skin:       As noted in HPI.  Neurological: Negative for headaches.  Psychiatric/Behavioral: Negative for depression and substance abuse. The patient is not nervous/anxious.     Past Medical History:  Diagnosis Date  . Diabetes mellitus without complication ([place])    with pregnancy    Social History   Tobacco Use  . Smoking status: Current Some Day Smoker    Packs/day: 0.50    Types: Cigarettes  .  Smokeless tobacco: Never Used  Substance Use Topics  . Alcohol use: No    Frequency: Never  . Drug use: No    History reviewed. No pertinent family history. Allergies  Allergen Reactions  . Tegretol [Carbamazepine] Other (See Comments)    Causes aplastic anemia  . Ambien [Zolpidem Tartrate] Rash    OBJECTIVE: Blood pressure 129/62, pulse 79, temperature 98.5 F (36.9 C), temperature source Oral, resp. rate 17, height 5\' 9"  (1.753 m), weight 300 lb (136.1 kg), last menstrual period 12/21/2016, SpO2 93 %.  Physical Exam    Constitutional: She is oriented to person, place, and time.  She became tearful when talking about her recent health problems and past drug use.  HENT:  Mouth/Throat: No oropharyngeal exudate.  Eyes: Conjunctivae are normal.  Cardiovascular: Normal rate and regular rhythm.  No murmur heard. Pulmonary/Chest: Effort normal. She has wheezes. She has no rales.  Abdominal: Soft. She exhibits no distension. There is no tenderness.  Musculoskeletal: Normal range of motion. She exhibits no edema or tenderness.  Neurological: She is alert and oriented to person, place, and time.  Skin:  She has several healing lesions on her right lower leg.  She states that there was erythema around the largest lesion on her lower shin that has now resolved since antibiotics were started.  There is a VAC wound dressing and Ace wrap on her left lower leg.  Psychiatric: Mood and affect normal.    Lab Results Lab Results  Component Value Date   WBC 8.6 01/17/2017   HGB 10.0 (L) 01/17/2017   HCT 33.0 (L) 01/17/2017   MCV 78.8 01/17/2017   PLT 357 01/17/2017    Lab Results  Component Value Date   CREATININE 0.63 01/17/2017   BUN 5 (L) 01/17/2017   NA 139 01/17/2017   K 4.1 01/17/2017   CL 101 01/17/2017   CO2 26 01/17/2017    Lab Results  Component Value Date   ALT 29 01/17/2017   AST 21 01/17/2017   ALKPHOS 110 01/17/2017   BILITOT 0.3 01/17/2017     Microbiology: Recent Results (from the past 240 hour(s))  Culture, blood (Routine x 2)     Status: None (Preliminary result)   Collection Time: 01/16/17  1:52 AM  Result Value Ref Range Status   Specimen Description BLOOD RIGHT HAND  Final   Special Requests   Final    BOTTLES DRAWN AEROBIC AND ANAEROBIC Blood Culture adequate volume   Culture NO GROWTH 1 DAY  Final   Report Status PENDING  Incomplete  Culture, blood (Routine x 2)     Status: None (Preliminary result)   Collection Time: 01/16/17  1:59 AM  Result Value Ref Range Status    Specimen Description BLOOD LEFT HAND  Final   Special Requests   Final    BOTTLES DRAWN AEROBIC ONLY Blood Culture adequate volume   Culture NO GROWTH 1 DAY  Final   Report Status PENDING  Incomplete  Wound or Superficial Culture     Status: None (Preliminary result)   Collection Time: 01/16/17  3:10 AM  Result Value Ref Range Status   Specimen Description WOUND LEFT LEG  Final   Special Requests NONE  Final   Gram Stain   Final    RARE WBC PRESENT, PREDOMINANTLY PMN MODERATE GRAM POSITIVE COCCI IN PAIRS MODERATE GRAM NEGATIVE RODS RARE GRAM POSITIVE RODS    Culture ABUNDANT GRAM NEGATIVE RODS  Final   Report Status PENDING  Incomplete  Surgical pcr screen     Status: None   Collection Time: 01/16/17  2:52 PM  Result Value Ref Range Status   MRSA, PCR NEGATIVE NEGATIVE Final   Staphylococcus aureus NEGATIVE NEGATIVE Final    Comment: (NOTE) The Xpert SA Assay (FDA approved for NASAL specimens in patients 27 years of age and older), is one component of a comprehensive surveillance program. It is not intended to diagnose infection nor to guide or monitor treatment.   Aerobic/Anaerobic Culture (surgical/deep wound)     Status: None (Preliminary result)   Collection Time: 01/16/17  5:18 PM  Result Value Ref Range Status   Specimen Description TISSUE LEFT LOWER LEG  Final   Special Requests PT ON ANCEF  Final   Gram Stain   Final    RARE WBC PRESENT, PREDOMINANTLY PMN NO ORGANISMS SEEN    Culture CULTURE REINCUBATED FOR BETTER GROWTH  Final   Report Status PENDING  Incomplete  Aerobic/Anaerobic Culture (surgical/deep wound)     Status: None (Preliminary result)   Collection Time: 01/16/17  5:26 PM  Result Value Ref Range Status   Specimen Description WOUND LEFT LEG  Final   Special Requests ANTERIOR SWAB  Final   Gram Stain   Final    RARE WBC PRESENT,BOTH PMN AND MONONUCLEAR RARE GRAM NEGATIVE RODS FEW GRAM POSITIVE COCCI    Culture TOO YOUNG TO READ  Final   Report  Status PENDING  Incomplete    Michel Bickers, MD [place] for Infectious Disease [facility] [phone] pager   [phone] cell 01/17/2017, 1:43 PM

## 2017-01-17 NOTE — Progress Notes (Signed)
Family Medicine Teaching Service Daily Progress Note Intern Pager: [phone]  Patient name: [name] Medical record number: [id number] Date of birth: 1985/01/11 Age: 32 y.o. Gender: female  Primary Care Provider: Patient, No Pcp Per Consultants: Orthopedics, wound care Code Status: DNR/DNI  Pt Overview and Major Events to Date:  1/14 admitted to fpts, I&D of LLE  Assessment and Plan: [name] is a 32 y.o. female presenting with 6 week history of developing LLE ulcer.  Left Lower Extremity Ulcer Patient had I&D of LLE ulcers on 1/14. Cultures sent off. Gram stain with gram positive diplococci. Will continue broad spectrum abx until more culture data available. Punch biopsies with no result yet, will continue to follow. Items on differentially still include infectious vs malignancy vs vascular. Could perhaps consider outpatient venous flow study pending biopsy results. Now with provena wound vac in place. Vac changes per wound care and ortho. - vital signs per floor routine - pulse ox with vitals - vanc per pharmacy consult - zosyn per pharmacy consult - follow up wound cultures - follow up punch biopsy results - lovenox for dvt ppx - regular diet - zofran for nausea - tylenol and ibuprofen for pain  Possible Type II diabetes A1C 6.5. Glucoses 131. No indication for insulin at this time. Could consider adding sliding scale if needed.  FEN/GI: regular diet PPx: lovenox  Disposition: home  Subjective:  Only complaint this am is for pain in LLE. Otherwise doing really well. No acute distress.  Objective: Temp:  [97 F (36.1 C)-98.8 F (37.1 C)] 98.5 F (36.9 C) (01/15 0624) Pulse Rate:  [79-116] 79 (01/15 0624) Resp:  [13-20] 17 (01/15 0624) BP: (117-147)/(62-78) 129/62 (01/15 0624) SpO2:  [93 %-100 %] 93 % (01/15 0737) Physical Exam: General: obese, caucasian female, resting comfortably in no acute distress, still reading a book. Cardiovascular: rrr, no  m/r/g, palpable radial pulse. Unable to palpable PT/DP bilaterally due to edema in BLE. 2+ pitting edema noted in ankles BLE. Respiratory: lungs CTAB, no chest pain Gastrointestinal: soft, non-tender, non-distended MSK: 5/5 strength all muscle groups BLE, BUE Derm: warm and dry, LLE wrapped, provena wound vac in place Neuro: cn 2-12 intact, no focal neuro deficit Psych: appropriate   Laboratory: Recent Labs  Lab 01/16/17 0152  WBC 10.6*  HGB 10.8*  HCT 34.3*  PLT 364   Recent Labs  Lab 01/16/17 0152  NA 136  K 3.7  CL 101  CO2 22  BUN 7  CREATININE 0.74  CALCIUM 9.2  PROT 7.9  BILITOT 0.6  ALKPHOS 141*  ALT 39  AST 31  GLUCOSE 145*    Imaging/Diagnostic Tests: CLINICAL DATA:  LEFT lower extremity drainage with venous stasis ulcers.  EXAM: LEFT TIBIA AND FIBULA - 2 VIEW  COMPARISON:  None.  FINDINGS: There is no evidence of fracture or other focal bone lesions. Soft tissue swelling with multiple skin defects in the anterolateral distal tibiofibular soft tissues. No subcutaneous gas or radiopaque foreign bodies.  IMPRESSION: Distal tibia and fibular ulceration without subcutaneous gas, radiopaque foreign bodies or acute osseous process.  CLINICAL DATA:  LEFT lower extremity drainage with venous stasis ulcers.  EXAM: LEFT TIBIA AND FIBULA - 2 VIEW  COMPARISON:  None.  FINDINGS: There is no evidence of fracture or other focal bone lesions. Soft tissue swelling with multiple skin defects in the anterolateral distal tibiofibular soft tissues. No subcutaneous gas or radiopaque foreign bodies.  IMPRESSION: Distal tibia and fibular ulceration without subcutaneous gas, radiopaque foreign bodies  or acute osseous process.  Guadalupe Dawn, MD 01/17/2017, 9:54 AM PGY-1, [place] Intern pager: [phone], text pages welcome

## 2017-01-17 NOTE — Care Management Note (Addendum)
Case Management Note  Patient Details  Name: [name] MRN: [id number] Date of Birth: Nov 17, 1985  Subjective/Objective:                    Action/Plan: VAC application faxed with resident's signature, Tommy with [employer] requesting attending, or ortho signature. Left VAC form in shadow chart , left message for Mikki Santee  Spoke to patient at bedside regarding discharge planning.   Patient has 34 year old and 40 year old.   Patient lives with her friend at address in [place]. Patient does not have a cell phone . Best way to contact  her is through her sister [name] cell phone [phone].  [employer] reps here working on home [employer] application.   Patient works at [employer] 60 hours a week , however, unable to now. Called Vivien Rota Careers information officer)  to see patient.   Patient does not have PCP . Patient agreeable to start care at [facility], will call for appointment.   On day of discharge will provide [facility] letter medication assistance.  Will continue to follow.     Expected Discharge Date:                  Expected Discharge Plan:  [place]  In-House Referral:  Clinical Social Work, Development worker, community  Discharge planning Services  CM Consult, [facility], [place], Medication Assistance, Other - See comment  Post Acute Care Choice:  Durable Medical Equipment, Home Health Choice offered to:  Patient  DME Arranged:  Vac DME Agency:  [employer]  HH Arranged:  RN [place] Agency:  [facility]  Status of Service:  In process, will continue to follow  If discussed at Long Length of Stay Meetings, dates discussed:    Additional Comments:  Marilu Favre, RN 01/17/2017, 10:31 AM

## 2017-01-17 NOTE — Op Note (Signed)
[name], [name]              ACCOUNT NO.:  [id number]  MEDICAL RECORD NO.:  [id number]  LOCATION:  C24C                         FACILITY:  [facility]  PHYSICIAN:  Alta Corning, M.D.   DATE OF BIRTH:  1985-01-31  DATE OF PROCEDURE:  01/16/2017 DATE OF DISCHARGE:                              OPERATIVE REPORT   PREOPERATIVE DIAGNOSIS:  Complex lower extremity wounds x4 ranging in size from 3 x 4 cm to 1 x 1 cm.  POSTOPERATIVE DIAGNOSIS:  Complex lower extremity wounds x4 ranging in size from 3 x 4 cm to 1 x 1 cm.  PROCEDURES: 1. Irrigation and debridement of  lower extremity wound      3 x 4 cm. 2.Excisional debridement of multiple lower ext wounds with Knife, pickups, scissors and currets and rongeurs. 3. Multiple punch biopsies of lower extremity and lower extremity     wounds, sent for evaluation by pathology. 4. Placement of a Prevena irrigation wound VAC. 5. Sending of multiple cultures including AFB and fungal cultures from     the lower extremity wound including scrapings of tissue from the     wounds.  SURGEON:  Alta Corning, MD.  ASSISTANT:  Gary Fleet, PA.  ANESTHESIA:  General.  BRIEF HISTORY:  Ms. Tenny is a 32 year old female with a history of being admitted last night.  She had multiple open wounds with significant tissue necrosis and foul-smelling drainage.  She historically had these wounds for 2-3 weeks and they continued to get worse.  She came in for evaluation.  I had a discussion with she and Dr. Lyndee Leo Dillingham about what these lesions might be.  The patient states that she has not had any IV drug abuse for over 2 years.  She is brought to the operating room for evaluation of these wounds and debridement.  DESCRIPTION OF PROCEDURE:  The patient was brought to the operative room and after adequate anesthesia was obtained with general anesthetic, the patient was placed supine on the operating table.  The left leg was prepped and draped in usual  sterile fashion.  Following this, multiple punch biopsies were taken right on the edges of two of the wounds and right from the center of one of the wounds to send tissue to Pathology to evaluate what these wounds might be.  Following this, wounds were significantly debrided with curettes, rongeurs, 15 blade and pickups, scissors and pickups, and sharply debrided with a knife and other mechanical debridement. There was one large wound and 3 smaller wounds wich were not as deep. At this point, the wounds were copiously and thoroughly lavaged with pulsatile lavage irrigation and suctioned dry.  Following this, the Prevena wound VAC was applied with very specific ways of putting a sticky drape over all of the entire wound followed by cutting holes around each of the wounds followed by use of the irrigating wound VAC and the Prevena wound VAC on top of this.  Once this was done, we felt that we could get excellent coverage of the wound with a single wound VAC and this was instilled and placed for operational purposes.  Images were taken of the wound after debridement, so they  could be placed in the chart.  At this point, a sterile compressive dressing was applied and the patient was taken to the recovery room where she was noted to be in satisfactory condition.  Estimated blood loss for the procedure was minimal.  No tourniquet was used.     Alta Corning, M.D.     Corliss Skains  D:  01/16/2017  T:  01/17/2017  Job:  [id number]

## 2017-01-17 NOTE — Progress Notes (Signed)
Subjective: 1 Day Post-Op Procedure(s) (LRB): DEBRIDEMENT OF LEFT ANTERIOR AND POSTERLATERAL TIBIAL WOUNDS (Left) APPLICATION OF WOUND VAC (Left) Patient reports pain as moderate.  VAC irrigating dressing intact.  Objective: Vital signs in last 24 hours: Temp:  [97 F (36.1 C)-98.8 F (37.1 C)] 98.2 F (36.8 C) (01/15 1348) Pulse Rate:  [79-116] 80 (01/15 1348) Resp:  [13-20] 16 (01/15 1348) BP: (117-148)/(54-78) 148/54 (01/15 1348) SpO2:  [93 %-100 %] 99 % (01/15 1348)  Intake/Output from previous day: 01/14 0701 - 01/15 0700 In: 1390 [P.O.:400; I.V.:990] Out: 1520 [Urine:1300; Drains:200; Blood:20] Intake/Output this shift: Total I/O In: 720 [P.O.:720] Out: -   Recent Labs    01/16/17 0152 01/17/17 0833  HGB 10.8* 10.0*   Recent Labs    01/16/17 0152 01/17/17 0833  WBC 10.6* 8.6  RBC 4.43 4.19  HCT 34.3* 33.0*  PLT 364 357   Recent Labs    01/16/17 0152 01/17/17 0833  NA 136 139  K 3.7 4.1  CL 101 101  CO2 22 26  BUN 7 5*  CREATININE 0.74 0.63  GLUCOSE 145* 131*  CALCIUM 9.2 9.2   Recent Labs    01/16/17 0152  INR 1.12  Cultures pending.  See Dr. Hale Bogus note for details. Left lower extremity exam: Dressing clean and dry.  VAC dressing intact and functioning properly.   Assessment/Plan: 1 Day Post-Op Procedure(s) (LRB): DEBRIDEMENT OF LEFT ANTERIOR AND POSTERLATERAL TIBIAL WOUNDS (Left) APPLICATION OF WOUND VAC (Left)  Plan: Continue VAC irrigating dressing.May be out of bed weightbearing as tolerated on left lower extremity to bathroom. Appreciate infectious disease assistance. We will follow cultures. Consider VAC dressing change on Thursday.We may get Dr. Marla Roe  of plastic surgery involved.  [place] G 01/17/2017, 2:44 PM

## 2017-01-18 DIAGNOSIS — E119 Type 2 diabetes mellitus without complications: Secondary | ICD-10-CM

## 2017-01-18 LAB — CBC WITH DIFFERENTIAL/PLATELET
Basophils Absolute: 0.1 10*3/uL (ref 0.0–0.1)
Basophils Relative: 1 %
EOS ABS: 0.2 10*3/uL (ref 0.0–0.7)
EOS PCT: 3 %
HCT: 29 % — ABNORMAL LOW (ref 36.0–46.0)
HEMOGLOBIN: 9 g/dL — AB (ref 12.0–15.0)
LYMPHS ABS: 3.4 10*3/uL (ref 0.7–4.0)
Lymphocytes Relative: 44 %
MCH: 24.3 pg — ABNORMAL LOW (ref 26.0–34.0)
MCHC: 31 g/dL (ref 30.0–36.0)
MCV: 78.2 fL (ref 78.0–100.0)
MONOS PCT: 7 %
Monocytes Absolute: 0.6 10*3/uL (ref 0.1–1.0)
NEUTROS PCT: 45 %
Neutro Abs: 3.4 10*3/uL (ref 1.7–7.7)
Platelets: 335 10*3/uL (ref 150–400)
RBC: 3.71 MIL/uL — ABNORMAL LOW (ref 3.87–5.11)
RDW: 15.5 % (ref 11.5–15.5)
WBC: 7.6 10*3/uL (ref 4.0–10.5)

## 2017-01-18 LAB — COMPREHENSIVE METABOLIC PANEL
ALK PHOS: 93 U/L (ref 38–126)
ALT: 24 U/L (ref 14–54)
AST: 17 U/L (ref 15–41)
Albumin: 2.9 g/dL — ABNORMAL LOW (ref 3.5–5.0)
Anion gap: 12 (ref 5–15)
BILIRUBIN TOTAL: 0.5 mg/dL (ref 0.3–1.2)
BUN: 7 mg/dL (ref 6–20)
CALCIUM: 8.9 mg/dL (ref 8.9–10.3)
CO2: 25 mmol/L (ref 22–32)
Chloride: 101 mmol/L (ref 101–111)
Creatinine, Ser: 0.69 mg/dL (ref 0.44–1.00)
GFR calc non Af Amer: >60 mL/min (ref >60)
Glucose, Bld: 120 mg/dL — ABNORMAL HIGH (ref 65–99)
Potassium: 3.6 mmol/L (ref 3.5–5.1)
SODIUM: 138 mmol/L (ref 135–145)
TOTAL PROTEIN: 6.3 g/dL — AB (ref 6.5–8.1)

## 2017-01-18 LAB — ACID FAST SMEAR (AFB): ACID FAST SMEAR - AFSCU2: NEGATIVE

## 2017-01-18 LAB — GLUCOSE, CAPILLARY: GLUCOSE-CAPILLARY: 101 mg/dL — AB (ref 65–99)

## 2017-01-18 MED ORDER — PIPERACILLIN-TAZOBACTAM 3.375 G IVPB
3.3750 g | Freq: Three times a day (TID) | INTRAVENOUS | Status: DC
  Administered 2017-01-18 – 2017-01-21 (×9): 3.375 g via INTRAVENOUS
  Filled 2017-01-18 (×11): qty 50

## 2017-01-18 NOTE — Progress Notes (Signed)
[place] for Infectious Disease   Reason for visit: Follow up on ulcer  Interval History: has VAC in place.  No associated n/v.  No rash.  No new complaints.  Pathology not back yet.    Physical Exam: Constitutional:  Vitals:   01/17/17 2148 01/18/17 0530  BP: (!) 117/44 (!) 134/53  Pulse: 81 73  Resp: 16 (!) 21  Temp: 98.7 F (37.1 C) 98.4 F (36.9 C)  SpO2: 96% 96%   patient appears in NAD Eyes: anicteric HENT: no thrush Respiratory: Normal respiratory effort; CTA B Cardiovascular: RRR MS: vac in place on left leg  Review of Systems: Constitutional: negative for fevers and chills Gastrointestinal: negative for diarrhea Integument/breast: negative for rash  Lab Results  Component Value Date   WBC 7.6 01/18/2017   HGB 9.0 (L) 01/18/2017   HCT 29.0 (L) 01/18/2017   MCV 78.2 01/18/2017   PLT 335 01/18/2017    Lab Results  Component Value Date   CREATININE 0.69 01/18/2017   BUN 7 01/18/2017   NA 138 01/18/2017   K 3.6 01/18/2017   CL 101 01/18/2017   CO2 25 01/18/2017    Lab Results  Component Value Date   ALT 24 01/18/2017   AST 17 01/18/2017   ALKPHOS 93 01/18/2017     Microbiology: Recent Results (from the past 240 hour(s))  Culture, blood (Routine x 2)     Status: None (Preliminary result)   Collection Time: 01/16/17  1:52 AM  Result Value Ref Range Status   Specimen Description BLOOD RIGHT HAND  Final   Special Requests   Final    BOTTLES DRAWN AEROBIC AND ANAEROBIC Blood Culture adequate volume   Culture NO GROWTH 1 DAY  Final   Report Status PENDING  Incomplete  Culture, blood (Routine x 2)     Status: None (Preliminary result)   Collection Time: 01/16/17  1:59 AM  Result Value Ref Range Status   Specimen Description BLOOD LEFT HAND  Final   Special Requests   Final    BOTTLES DRAWN AEROBIC ONLY Blood Culture adequate volume   Culture NO GROWTH 1 DAY  Final   Report Status PENDING  Incomplete  Wound or Superficial Culture      Status: None (Preliminary result)   Collection Time: 01/16/17  3:10 AM  Result Value Ref Range Status   Specimen Description WOUND LEFT LEG  Final   Special Requests NONE  Final   Gram Stain   Final    RARE WBC PRESENT, PREDOMINANTLY PMN MODERATE GRAM POSITIVE COCCI IN PAIRS MODERATE GRAM NEGATIVE RODS RARE GRAM POSITIVE RODS    Culture   Final    ABUNDANT PSEUDOMONAS AERUGINOSA CULTURE REINCUBATED FOR BETTER GROWTH    Report Status PENDING  Incomplete   Organism ID, Bacteria PSEUDOMONAS AERUGINOSA  Final      Susceptibility   Pseudomonas aeruginosa - MIC*    CEFTAZIDIME 2 SENSITIVE Sensitive     CIPROFLOXACIN <=0.25 SENSITIVE Sensitive     GENTAMICIN <=1 SENSITIVE Sensitive     IMIPENEM 1 SENSITIVE Sensitive     PIP/TAZO <=4 SENSITIVE Sensitive     CEFEPIME <=1 SENSITIVE Sensitive     * ABUNDANT PSEUDOMONAS AERUGINOSA  Surgical pcr screen     Status: None   Collection Time: 01/16/17  2:52 PM  Result Value Ref Range Status   MRSA, PCR NEGATIVE NEGATIVE Final   Staphylococcus aureus NEGATIVE NEGATIVE Final    Comment: (NOTE) The Xpert SA Assay (  FDA approved for NASAL specimens in patients 74 years of age and older), is one component of a comprehensive surveillance program. It is not intended to diagnose infection nor to guide or monitor treatment.   Aerobic/Anaerobic Culture (surgical/deep wound)     Status: None (Preliminary result)   Collection Time: 01/16/17  5:18 PM  Result Value Ref Range Status   Specimen Description TISSUE LEFT LOWER LEG  Final   Special Requests PT ON ANCEF  Final   Gram Stain   Final    RARE WBC PRESENT, PREDOMINANTLY PMN NO ORGANISMS SEEN    Culture CULTURE REINCUBATED FOR BETTER GROWTH  Final   Report Status PENDING  Incomplete  Acid Fast Smear (AFB)     Status: None   Collection Time: 01/16/17  5:18 PM  Result Value Ref Range Status   AFB Specimen Processing Concentration  Final   Acid Fast Smear Negative  Final    Comment:  (NOTE) Performed At: [facility] [place], [place] [id number] Rush Farmer MD [id number]    Source (AFB) TISSUE  Corrected    Comment: LEFT LOWER LEG CORRECTED ON 01/14 AT 1932: PREVIOUSLY REPORTED AS WOUND LEFT LOWER LEG   Aerobic/Anaerobic Culture (surgical/deep wound)     Status: None (Preliminary result)   Collection Time: 01/16/17  5:26 PM  Result Value Ref Range Status   Specimen Description WOUND LEFT LEG  Final   Special Requests ANTERIOR SWAB  Final   Gram Stain   Final    RARE WBC PRESENT,BOTH PMN AND MONONUCLEAR RARE GRAM NEGATIVE RODS FEW GRAM POSITIVE COCCI    Culture RARE GRAM NEGATIVE RODS  Final   Report Status PENDING  Incomplete    Impression/Plan:  1. Leg ulcers - on VAC.  Old and healing.  Culture of wound noted and doubt significance of wound swabs.  She is to have VAC changed tomorrow, if no signs of cellulitis, will stop antibiotics. I will narrow to just vancomycin today.    2.  Screening - HIV negative  3.  History of IVDU - I will check for hepatitis C.

## 2017-01-18 NOTE — Progress Notes (Signed)
Culture noted and wound culture with psuedomonas.  I will put zosyn back on and hold vancomycin for now to reduce chance of significant nephrotoxicity.   Thayer Headings, MD

## 2017-01-18 NOTE — Progress Notes (Signed)
Family Medicine Teaching Service Daily Progress Note Intern Pager: [phone]  Patient name: [name] Medical record number: [id number] Date of birth: January 28, 1985 Age: 32 y.o. Gender: female  Primary Care Provider: Patient, No Pcp Per Consultants: Orthopedics, wound care Code Status: DNR/DNI  Pt Overview and Major Events to Date:  1/14 admitted to fpts, I&D of LLE  Assessment and Plan: [name] is a 32 y.o. female presenting with 6 week history of developing LLE ulcer.  Left Lower Extremity Ulcer Patient had I&D of LLE ulcers on 1/14. Cultures of wound showing gram positive cocci, gram negative rods, pseudomonas. Speciation and cultures to follow. Punch biopsies also pending. Ortho and ID following, appreciate their recs. On vanc and zosyn per ID recs. Ortho providing wound care, possible vac change 1/17. - vital signs per floor routine - pulse ox with vitals - vanc per pharmacy consult - zosyn per pharmacy consult - follow up wound cultures, and speciation - follow up punch biopsy results - lovenox for dvt ppx - regular diet - zofran for nausea - tylenol and ibuprofen for pain  Possible Type II diabetes A1C 6.5. Glucoses 131. No indication for insulin at this time. Could consider adding sliding scale if needed.  FEN/GI: regular diet PPx: lovenox  Disposition: home  Subjective:  No complaints. Feeling bored in room as she reiterated several times that she is an active person. Tolerating good PO.  Objective: Temp:  [98.2 F (36.8 C)-98.7 F (37.1 C)] 98.4 F (36.9 C) (01/16 0530) Pulse Rate:  [73-81] 73 (01/16 0530) Resp:  [16-21] 21 (01/16 0530) BP: (117-148)/(44-54) 134/53 (01/16 0530) SpO2:  [96 %-99 %] 96 % (01/16 0530) Physical Exam: General: obese, caucasian female, resting comfortably in no acute distress Cardiovascular: rrr, no m/r/g, palpable radial pulse. Unable to palpable PT/DP bilaterally due to edema in BLE. 2+ pitting edema noted in ankles  BLE. Respiratory: lungs CTAB, no chest pain Gastrointestinal: soft, non-tender, non-distended MSK: 5/5 strength all muscle groups BLE, BUE Derm: warm and dry, LLE wrapped, [place] wound vac in place Neuro: cn 2-12 intact, no focal neuro deficit Psych: appropriate   Laboratory: Recent Labs  Lab 01/16/17 0152 01/17/17 0833 01/18/17 0415  WBC 10.6* 8.6 7.6  HGB 10.8* 10.0* 9.0*  HCT 34.3* 33.0* 29.0*  PLT 364 357 335   Recent Labs  Lab 01/16/17 0152 01/17/17 0833 01/18/17 0415  NA 136 139 138  K 3.7 4.1 3.6  CL 101 101 101  CO2 22 26 25   BUN 7 5* 7  CREATININE 0.74 0.63 0.69  CALCIUM 9.2 9.2 8.9  PROT 7.9 7.3 6.3*  BILITOT 0.6 0.3 0.5  ALKPHOS 141* 110 93  ALT 39 29 24  AST 31 21 17   GLUCOSE 145* 131* 120*    Imaging/Diagnostic Tests: CLINICAL DATA:  LEFT lower extremity drainage with venous stasis ulcers.  EXAM: LEFT TIBIA AND FIBULA - 2 VIEW  COMPARISON:  None.  FINDINGS: There is no evidence of fracture or other focal bone lesions. Soft tissue swelling with multiple skin defects in the anterolateral distal tibiofibular soft tissues. No subcutaneous gas or radiopaque foreign bodies.  IMPRESSION: Distal tibia and fibular ulceration without subcutaneous gas, radiopaque foreign bodies or acute osseous process.  CLINICAL DATA:  LEFT lower extremity drainage with venous stasis ulcers.  EXAM: LEFT TIBIA AND FIBULA - 2 VIEW  COMPARISON:  None.  FINDINGS: There is no evidence of fracture or other focal bone lesions. Soft tissue swelling with multiple skin defects in the anterolateral distal  tibiofibular soft tissues. No subcutaneous gas or radiopaque foreign bodies.  IMPRESSION: Distal tibia and fibular ulceration without subcutaneous gas, radiopaque foreign bodies or acute osseous process.  Guadalupe Dawn, MD 01/18/2017, 9:43 AM PGY-1, [place] Intern pager: [phone], text pages welcome

## 2017-01-18 NOTE — Progress Notes (Signed)
Subjective: 2 Days Post-Op Procedure(s) (LRB): DEBRIDEMENT OF LEFT ANTERIOR AND POSTERLATERAL TIBIAL WOUNDS (Left) APPLICATION OF WOUND VAC (Left) Patient reports pain as mild.  VAC in place.  Objective: Vital signs in last 24 hours: Temp:  [98.4 F (36.9 C)-98.7 F (37.1 C)] 98.4 F (36.9 C) (01/16 0530) Pulse Rate:  [73-81] 73 (01/16 0530) Resp:  [16-21] 21 (01/16 0530) BP: (117-134)/(44-53) 134/53 (01/16 0530) SpO2:  [96 %] 96 % (01/16 0530)  Intake/Output from previous day: 01/15 0701 - 01/16 0700 In: 1970 [P.O.:1500; I.V.:80; IV Piggyback:350] Out: 200 [Drains:200] Intake/Output this shift: Total I/O In: 40 [Other:40] Out: 100 [Drains:100]  Recent Labs    01/16/17 0152 01/17/17 0833 01/18/17 0415  HGB 10.8* 10.0* 9.0*   Recent Labs    01/17/17 0833 01/18/17 0415  WBC 8.6 7.6  RBC 4.19 3.71*  HCT 33.0* 29.0*  PLT 357 335   Recent Labs    01/17/17 0833 01/18/17 0415  NA 139 138  K 4.1 3.6  CL 101 101  CO2 26 25  BUN 5* 7  CREATININE 0.63 0.69  GLUCOSE 131* 120*  CALCIUM 9.2 8.9   Recent Labs    01/16/17 0152  INR 1.12   Left leg exam: VAC in place and functioning well. No redness or streaking up leg. NV intact distally   Assessment/Plan: 2 Days Post-Op Procedure(s) (LRB): DEBRIDEMENT OF LEFT ANTERIOR AND POSTERLATERAL TIBIAL WOUNDS (Left) APPLICATION OF WOUND VAC (Left)  Plan: Cont irrigating VAC Will change in AM Awaiting Cx results.   Rosa G 01/18/2017, 2:04 PM

## 2017-01-18 NOTE — Care Management Note (Signed)
Case Management Note  Patient Details  Name: [name] MRN: [id number] Date of Birth: [birth year]  Subjective/Objective:                    Action/Plan:  Spoke with Lelon Frohlich at [place] office, explained need VAC application form signed.Per Lelon Frohlich  Dr Berenice Primas and Gaspar Skeeters PA are in surgery all day and are unpageable. Ann instructed NCM to send both in basket messages. Same done. Expected Discharge Date:                  Expected Discharge Plan:  [place]  In-House Referral:  Clinical Social Work, Development worker, community  Discharge planning Services  CM Consult, [facility], [place], Medication Assistance, Other - See comment  Post Acute Care Choice:  Durable Medical Equipment, Home Health Choice offered to:  Patient  DME Arranged:  Vac DME Agency:  [facility]  HH Arranged:  RN [place] Agency:  [place]  Status of Service:  In process, will continue to follow  If discussed at Long Length of Stay Meetings, dates discussed:    Additional Comments:  Marilu Favre, RN 01/18/2017, 10:54 AM

## 2017-01-18 NOTE — Progress Notes (Signed)
CRITICAL VALUE ALERT  Critical Value:  Pseudomonas Aerugenosa from wound culture on left leg  Date & Time Notied:  01/18/17 @ 14:50  Provider Notified: Guadalupe Dawn  Orders Received/Actions taken:still waiting for call back

## 2017-01-19 ENCOUNTER — Encounter (HOSPITAL_COMMUNITY): Payer: Self-pay | Admitting: Certified Registered Nurse Anesthetist

## 2017-01-19 ENCOUNTER — Inpatient Hospital Stay (HOSPITAL_COMMUNITY): Payer: Medicare Other | Admitting: Certified Registered Nurse Anesthetist

## 2017-01-19 ENCOUNTER — Encounter (HOSPITAL_COMMUNITY)
Admission: EM | Disposition: A | Discharge status: Home Health | Payer: Self-pay | Source: Home / Self Care | Attending: Family Medicine

## 2017-01-19 HISTORY — PX: I & D EXTREMITY: SHX5045

## 2017-01-19 HISTORY — PX: APPLICATION OF A-CELL OF EXTREMITY: SHX6303

## 2017-01-19 LAB — CBC WITH DIFFERENTIAL/PLATELET
BASOS PCT: 1 %
Basophils Absolute: 0.1 10*3/uL (ref 0.0–0.1)
Eosinophils Absolute: 0.3 10*3/uL (ref 0.0–0.7)
Eosinophils Relative: 4 %
HEMATOCRIT: 30.5 % — AB (ref 36.0–46.0)
HEMOGLOBIN: 9.4 g/dL — AB (ref 12.0–15.0)
LYMPHS ABS: 2.9 10*3/uL (ref 0.7–4.0)
Lymphocytes Relative: 35 %
MCH: 24 pg — AB (ref 26.0–34.0)
MCHC: 30.8 g/dL (ref 30.0–36.0)
MCV: 78 fL (ref 78.0–100.0)
MONOS PCT: 6 %
Monocytes Absolute: 0.5 10*3/uL (ref 0.1–1.0)
NEUTROS ABS: 4.5 10*3/uL (ref 1.7–7.7)
Neutrophils Relative %: 54 %
Platelets: 336 10*3/uL (ref 150–400)
RBC: 3.91 MIL/uL (ref 3.87–5.11)
RDW: 15.3 % (ref 11.5–15.5)
WBC: 8.2 10*3/uL (ref 4.0–10.5)

## 2017-01-19 LAB — GLUCOSE, CAPILLARY
GLUCOSE-CAPILLARY: 100 mg/dL — AB (ref 65–99)
Glucose-Capillary: 124 mg/dL — ABNORMAL HIGH (ref 65–99)

## 2017-01-19 SURGERY — IRRIGATION AND DEBRIDEMENT EXTREMITY
Anesthesia: General | Site: Leg Lower | Laterality: Left

## 2017-01-19 MED ORDER — ONDANSETRON HCL 4 MG/2ML IJ SOLN
INTRAMUSCULAR | Status: AC
  Filled 2017-01-19: qty 2

## 2017-01-19 MED ORDER — DEXMEDETOMIDINE HCL IN NACL 200 MCG/50ML IV SOLN
INTRAVENOUS | Status: DC | PRN
  Administered 2017-01-19 (×3): 20 ug via INTRAVENOUS

## 2017-01-19 MED ORDER — DEXAMETHASONE SODIUM PHOSPHATE 4 MG/ML IJ SOLN
INTRAMUSCULAR | Status: DC | PRN
  Administered 2017-01-19: 10 mg via INTRAVENOUS

## 2017-01-19 MED ORDER — HYDROMORPHONE HCL 1 MG/ML IJ SOLN: 0.2500 mg | INTRAMUSCULAR | Status: DC | PRN

## 2017-01-19 MED ORDER — LIDOCAINE 2% (20 MG/ML) 5 ML SYRINGE
INTRAMUSCULAR | Status: AC
  Filled 2017-01-19: qty 5

## 2017-01-19 MED ORDER — MIDAZOLAM HCL 5 MG/5ML IJ SOLN
INTRAMUSCULAR | Status: DC | PRN
  Administered 2017-01-19: 2 mg via INTRAVENOUS

## 2017-01-19 MED ORDER — FENTANYL CITRATE (PF) 100 MCG/2ML IJ SOLN
INTRAMUSCULAR | Status: DC | PRN
  Administered 2017-01-19 (×4): 50 ug via INTRAVENOUS
  Administered 2017-01-19: 100 ug via INTRAVENOUS
  Administered 2017-01-19: 50 ug via INTRAVENOUS

## 2017-01-19 MED ORDER — SODIUM CHLORIDE 0.9 % IV SOLN
INTRAVENOUS | Status: DC | PRN
  Administered 2017-01-19: 500 mL

## 2017-01-19 MED ORDER — OXYCODONE HCL 5 MG PO TABS: 5.0000 mg | ORAL_TABLET | Freq: Once | ORAL | Status: DC | PRN

## 2017-01-19 MED ORDER — ONDANSETRON HCL 4 MG/2ML IJ SOLN
INTRAMUSCULAR | Status: DC | PRN
  Administered 2017-01-19: 4 mg via INTRAVENOUS

## 2017-01-19 MED ORDER — PROPOFOL 10 MG/ML IV BOLUS
INTRAVENOUS | Status: DC | PRN
  Administered 2017-01-19: 200 mg via INTRAVENOUS

## 2017-01-19 MED ORDER — DEXMEDETOMIDINE HCL IN NACL 200 MCG/50ML IV SOLN
INTRAVENOUS | Status: AC
  Filled 2017-01-19: qty 50

## 2017-01-19 MED ORDER — OXYCODONE HCL 5 MG/5ML PO SOLN: 5.0000 mg | Freq: Once | ORAL | Status: DC | PRN

## 2017-01-19 MED ORDER — LIDOCAINE 2% (20 MG/ML) 5 ML SYRINGE
INTRAMUSCULAR | Status: DC | PRN
Start: 2017-01-19 — End: 2017-01-19
  Administered 2017-01-19: 100 mg via INTRAVENOUS

## 2017-01-19 MED ORDER — FENTANYL CITRATE (PF) 250 MCG/5ML IJ SOLN
INTRAMUSCULAR | Status: AC
  Filled 2017-01-19: qty 5

## 2017-01-19 MED ORDER — DEXAMETHASONE SODIUM PHOSPHATE 10 MG/ML IJ SOLN
INTRAMUSCULAR | Status: AC
  Filled 2017-01-19: qty 1

## 2017-01-19 MED ORDER — 0.9 % SODIUM CHLORIDE (POUR BTL) OPTIME
TOPICAL | Status: DC | PRN
  Administered 2017-01-19: 1000 mL

## 2017-01-19 MED ORDER — PROPOFOL 10 MG/ML IV BOLUS
INTRAVENOUS | Status: AC
  Filled 2017-01-19: qty 20

## 2017-01-19 MED ORDER — MIDAZOLAM HCL 2 MG/2ML IJ SOLN
INTRAMUSCULAR | Status: AC
  Filled 2017-01-19: qty 2

## 2017-01-19 MED ORDER — SUCCINYLCHOLINE CHLORIDE 200 MG/10ML IV SOSY
PREFILLED_SYRINGE | INTRAVENOUS | Status: AC
  Filled 2017-01-19: qty 10

## 2017-01-19 SURGICAL SUPPLY — 56 items
BAG DECANTER FOR FLEXI CONT (MISCELLANEOUS) ×3 IMPLANT
BANDAGE ACE 4X5 VEL STRL LF (GAUZE/BANDAGES/DRESSINGS) ×6 IMPLANT
BANDAGE ACE 6X5 VEL STRL LF (GAUZE/BANDAGES/DRESSINGS) ×3 IMPLANT
BLADE CLIPPER SURG (BLADE) IMPLANT
BNDG GAUZE ELAST 4 BULKY (GAUZE/BANDAGES/DRESSINGS) ×6 IMPLANT
CANISTER SUCT 3000ML PPV (MISCELLANEOUS) ×3 IMPLANT
CANISTER WOUND CARE 500ML ATS (WOUND CARE) ×3 IMPLANT
CHLORAPREP W/TINT 26ML (MISCELLANEOUS) IMPLANT
COVER SURGICAL LIGHT HANDLE (MISCELLANEOUS) ×3 IMPLANT
DRAPE HALF SHEET 40X57 (DRAPES) IMPLANT
DRAPE INCISE IOBAN 66X45 STRL (DRAPES) ×3 IMPLANT
DRAPE ORTHO SPLIT 77X108 STRL (DRAPES)
DRAPE SURG ORHT 6 SPLT 77X108 (DRAPES) IMPLANT
DRESSING HYDROCOLLOID 4X4 (GAUZE/BANDAGES/DRESSINGS) ×3 IMPLANT
DRSG ADAPTIC 3X8 NADH LF (GAUZE/BANDAGES/DRESSINGS) IMPLANT
DRSG PAD ABDOMINAL 8X10 ST (GAUZE/BANDAGES/DRESSINGS) IMPLANT
DRSG VAC ATS LRG SENSATRAC (GAUZE/BANDAGES/DRESSINGS) IMPLANT
DRSG VAC ATS MED SENSATRAC (GAUZE/BANDAGES/DRESSINGS) ×3 IMPLANT
DRSG VAC ATS SM SENSATRAC (GAUZE/BANDAGES/DRESSINGS) IMPLANT
ELECT CAUTERY BLADE 6.4 (BLADE) ×3 IMPLANT
ELECT REM PT RETURN 9FT ADLT (ELECTROSURGICAL) ×3
ELECTRODE REM PT RTRN 9FT ADLT (ELECTROSURGICAL) ×1 IMPLANT
GAUZE SPONGE 4X4 12PLY STRL (GAUZE/BANDAGES/DRESSINGS) IMPLANT
GEL ULTRASOUND 20GR AQUASONIC (MISCELLANEOUS) IMPLANT
GLOVE BIO SURGEON STRL SZ 6.5 (GLOVE) ×4 IMPLANT
GLOVE BIO SURGEONS STRL SZ 6.5 (GLOVE) ×2
GOWN STRL REUS W/ TWL LRG LVL3 (GOWN DISPOSABLE) ×3 IMPLANT
GOWN STRL REUS W/TWL LRG LVL3 (GOWN DISPOSABLE) ×6
HANDPIECE INTERPULSE COAX TIP (DISPOSABLE)
KIT BASIN OR (CUSTOM PROCEDURE TRAY) ×3 IMPLANT
KIT ROOM TURNOVER OR (KITS) ×3 IMPLANT
MATRIX WOUND 3-LAYER 10X15 (Tissue) ×2 IMPLANT
MICROMATRIX 1000MG (Tissue) ×3 IMPLANT
MICROMATRIX 500MG (Tissue) ×3 IMPLANT
NS IRRIG 1000ML POUR BTL (IV SOLUTION) ×3 IMPLANT
PACK GENERAL/GYN (CUSTOM PROCEDURE TRAY) ×3 IMPLANT
PACK ORTHO EXTREMITY (CUSTOM PROCEDURE TRAY) ×3 IMPLANT
PAD ARMBOARD 7.5X6 YLW CONV (MISCELLANEOUS) ×6 IMPLANT
PAD NEG PRESSURE SENSATRAC (MISCELLANEOUS) IMPLANT
SET HNDPC FAN SPRY TIP SCT (DISPOSABLE) IMPLANT
SOL PREP POV-IOD 4OZ 10% (MISCELLANEOUS) ×3 IMPLANT
SOLUTION PARTIC MCRMTRX 1000MG (Tissue) ×1 IMPLANT
SOLUTION PARTIC MCRMTRX 500MG (Tissue) ×1 IMPLANT
STAPLER VISISTAT 35W (STAPLE) IMPLANT
STOCKINETTE IMPERVIOUS 9X36 MD (GAUZE/BANDAGES/DRESSINGS) IMPLANT
STOCKINETTE IMPERVIOUS LG (DRAPES) IMPLANT
SUT SILK 4 0 P 3 (SUTURE) IMPLANT
SUT SILK 4 0 PS 2 (SUTURE) IMPLANT
SUT VIC AB 5-0 PS2 18 (SUTURE) ×9 IMPLANT
TOWEL OR 17X24 6PK STRL BLUE (TOWEL DISPOSABLE) ×3 IMPLANT
TOWEL OR 17X26 10 PK STRL BLUE (TOWEL DISPOSABLE) ×3 IMPLANT
TUBE CONNECTING 12'X1/4 (SUCTIONS) ×1
TUBE CONNECTING 12X1/4 (SUCTIONS) ×2 IMPLANT
UNDERPAD 30X30 (UNDERPADS AND DIAPERS) ×6 IMPLANT
WOUND MATRIX 3-LAYER 10X15 (Tissue) ×1 IMPLANT
YANKAUER SUCT BULB TIP NO VENT (SUCTIONS) ×3 IMPLANT

## 2017-01-19 NOTE — H&P (View-Only) (Signed)
Reason for Consult: Left leg wound Referring Physician: Dr. Dorna Leitz  [name] is an 32 y.o. female.  HPI: The patient is a 32 yrs old female who states she developed a wound ~ 2 months ago on the anterior aspect of her left leg.  She is not aware of any trauma or spider bites.  She is diabetic.  She also admits that she has suffered with wounds on her legs for years. She works full time at a [employer].  She is on her feet for long periods of time and occasionally wears compression socks for the swelling.  She was taken to the OR two days ago for debridement.  The wounds are improving.  The patient had the VAC on till this morning.  She has a complex social history and may have been exposed to bed bugs, fleas, etc.  She states to have been clean off drugs for the past 2 years.  Past Medical History:  Diagnosis Date  . Diabetes mellitus without complication ([place])    with pregnancy    Past Surgical History:  Procedure Laterality Date  . APPLICATION OF WOUND VAC Left 01/16/2017   Procedure: APPLICATION OF WOUND VAC;  Surgeon: Dorna Leitz, MD;  Location: [facility];  Service: Orthopedics;  Laterality: Left;  . CESAREAN SECTION    . I&D EXTREMITY Left 01/16/2017   Procedure: DEBRIDEMENT OF LEFT ANTERIOR AND POSTERLATERAL TIBIAL WOUNDS;  Surgeon: Dorna Leitz, MD;  Location: [facility];  Service: Orthopedics;  Laterality: Left;    History reviewed. No pertinent family history.  Social History:  reports that she has been smoking cigarettes.  She has been smoking about 0.50 packs per day. she has never used smokeless tobacco. She reports that she does not drink alcohol or use drugs.  Allergies:  Allergies  Allergen Reactions  . Tegretol [Carbamazepine] Other (See Comments)    Causes aplastic anemia  . Ambien [Zolpidem Tartrate] Rash    Medications: I have reviewed the patient's current medications.  Results for orders placed or performed during the hospital encounter of 01/16/17  (from the past 48 hour(s))  CBC with Differential/Platelet     Status: Abnormal   Collection Time: 01/18/17  4:15 AM  Result Value Ref Range   WBC 7.6 4.0 - 10.5 K/uL   RBC 3.71 (L) 3.87 - 5.11 MIL/uL   Hemoglobin 9.0 (L) 12.0 - 15.0 g/dL   HCT 29.0 (L) 36.0 - 46.0 %   MCV 78.2 78.0 - 100.0 fL   MCH 24.3 (L) 26.0 - 34.0 pg   MCHC 31.0 30.0 - 36.0 g/dL   RDW 15.5 11.5 - 15.5 %   Platelets 335 150 - 400 K/uL   Neutrophils Relative % 45 %   Neutro Abs 3.4 1.7 - 7.7 K/uL   Lymphocytes Relative 44 %   Lymphs Abs 3.4 0.7 - 4.0 K/uL   Monocytes Relative 7 %   Monocytes Absolute 0.6 0.1 - 1.0 K/uL   Eosinophils Relative 3 %   Eosinophils Absolute 0.2 0.0 - 0.7 K/uL   Basophils Relative 1 %   Basophils Absolute 0.1 0.0 - 0.1 K/uL  Comprehensive metabolic panel     Status: Abnormal   Collection Time: 01/18/17  4:15 AM  Result Value Ref Range   Sodium 138 135 - 145 mmol/L   Potassium 3.6 3.5 - 5.1 mmol/L   Chloride 101 101 - 111 mmol/L   CO2 25 22 - 32 mmol/L   Glucose, Bld 120 (H) 65 -  99 mg/dL   BUN 7 6 - 20 mg/dL   Creatinine, Ser 0.69 0.44 - 1.00 mg/dL   Calcium 8.9 8.9 - 10.3 mg/dL   Total Protein 6.3 (L) 6.5 - 8.1 g/dL   Albumin 2.9 (L) 3.5 - 5.0 g/dL   AST 17 15 - 41 U/L   ALT 24 14 - 54 U/L   Alkaline Phosphatase 93 38 - 126 U/L   Total Bilirubin 0.5 0.3 - 1.2 mg/dL   GFR calc non Af Amer >60 >60 mL/min   GFR calc Af Amer >60 >60 mL/min    Comment: (NOTE) The eGFR has been calculated using the CKD EPI equation. This calculation has not been validated in all clinical situations. eGFR's persistently <60 mL/min signify possible Chronic Kidney Disease.    Anion gap 12 5 - 15  Glucose, capillary     Status: Abnormal   Collection Time: 01/18/17  7:40 AM  Result Value Ref Range   Glucose-Capillary 101 (H) 65 - 99 mg/dL  CBC with Differential/Platelet     Status: Abnormal   Collection Time: 01/19/17  6:19 AM  Result Value Ref Range   WBC 8.2 4.0 - 10.5 K/uL   RBC 3.91  3.87 - 5.11 MIL/uL   Hemoglobin 9.4 (L) 12.0 - 15.0 g/dL   HCT 30.5 (L) 36.0 - 46.0 %   MCV 78.0 78.0 - 100.0 fL   MCH 24.0 (L) 26.0 - 34.0 pg   MCHC 30.8 30.0 - 36.0 g/dL   RDW 15.3 11.5 - 15.5 %   Platelets 336 150 - 400 K/uL   Neutrophils Relative % 54 %   Neutro Abs 4.5 1.7 - 7.7 K/uL   Lymphocytes Relative 35 %   Lymphs Abs 2.9 0.7 - 4.0 K/uL   Monocytes Relative 6 %   Monocytes Absolute 0.5 0.1 - 1.0 K/uL   Eosinophils Relative 4 %   Eosinophils Absolute 0.3 0.0 - 0.7 K/uL   Basophils Relative 1 %   Basophils Absolute 0.1 0.0 - 0.1 K/uL  Glucose, capillary     Status: Abnormal   Collection Time: 01/19/17  7:54 AM  Result Value Ref Range   Glucose-Capillary 124 (H) 65 - 99 mg/dL    No results found.  Review of Systems  Constitutional: Negative.   HENT: Negative.   Eyes: Negative.   Respiratory: Negative.   Cardiovascular: Negative.   Gastrointestinal: Negative.   Genitourinary: Negative.   Musculoskeletal: Negative.   Skin: Negative.   Psychiatric/Behavioral: Negative.    Blood pressure (!) 111/54, pulse 73, temperature 98 F (36.7 C), temperature source Oral, resp. rate 19, height 5' 9"  (1.753 m), weight 136.1 kg (300 lb), last menstrual period 12/21/2016, SpO2 97 %. Physical Exam  Constitutional: She appears well-developed and well-nourished.  HENT:  Head: Normocephalic and atraumatic.  Cardiovascular: Normal rate.  Respiratory: Effort normal. No respiratory distress.  GI: Soft. She exhibits no distension. There is no tenderness.  Musculoskeletal: She exhibits edema and tenderness.  Neurological: She is alert.  Psychiatric: She has a normal mood and affect. Her behavior is normal. Judgment and thought content normal.    Assessment/Plan: Plan for debridement and placement of Acell and VAC.  [name] 01/19/2017, 1:26 PM

## 2017-01-19 NOTE — Anesthesia Postprocedure Evaluation (Signed)
Anesthesia Post Note  Patient: [name]  Procedure(s) Performed: IRRIGATION AND DEBRIDEMENT EXTREMITY (Left Leg Lower) APPLICATION OF A-CELL OF EXTREMITY AND WOUND VAC (Left Leg Lower)     Patient location during evaluation: PACU Anesthesia Type: General Level of consciousness: sedated and patient cooperative Pain management: pain level controlled Vital Signs Assessment: post-procedure vital signs reviewed and stable Respiratory status: spontaneous breathing Cardiovascular status: stable Anesthetic complications: no    Last Vitals:  Vitals:   01/19/17 1724 01/19/17 1743  BP:  (!) 105/55  Pulse:  77  Resp:  16  Temp: 36.7 C 37.1 C  SpO2:  99%    Last Pain:  Vitals:   01/19/17 1940  TempSrc:   PainSc: [place]

## 2017-01-19 NOTE — Anesthesia Preprocedure Evaluation (Signed)
Anesthesia Evaluation  Patient identified by MRN, date of birth, ID band Patient awake    Reviewed: Allergy & Precautions, NPO status , Patient's Chart, lab work & pertinent test results  Airway Mallampati: II  TM Distance: >3 FB Neck ROM: Full    Dental  (+) Dental Advisory Given   Pulmonary Current Smoker,    Pulmonary exam normal breath sounds clear to auscultation       Cardiovascular negative cardio ROS Normal cardiovascular exam Rhythm:Regular Rate:Normal     Neuro/Psych negative neurological ROS  negative psych ROS   GI/Hepatic negative GI ROS, Neg liver ROS,   Endo/Other  diabetesMorbid obesity  Renal/GU negative Renal ROS  negative genitourinary   Musculoskeletal negative musculoskeletal ROS (+)   Abdominal (+) + obese,   Peds  Hematology  (+) anemia ,   Anesthesia Other Findings   Reproductive/Obstetrics                             Anesthesia Physical Anesthesia Plan  ASA: III  Anesthesia Plan: General   Post-op Pain Management:    Induction: Intravenous  PONV Risk Score and Plan: 3 and Treatment may vary due to age or medical condition, Dexamethasone and Ondansetron  Airway Management Planned: LMA  Additional Equipment:   Intra-op Plan:   Post-operative Plan: Extubation in OR  Informed Consent: I have reviewed the patients History and Physical, chart, labs and discussed the procedure including the risks, benefits and alternatives for the proposed anesthesia with the patient or authorized representative who has indicated his/her understanding and acceptance.   Dental advisory given  Plan Discussed with: CRNA  Anesthesia Plan Comments:         Anesthesia Quick Evaluation

## 2017-01-19 NOTE — Transfer of Care (Signed)
Immediate Anesthesia Transfer of Care Note  Patient: [name]  Procedure(s) Performed: IRRIGATION AND DEBRIDEMENT EXTREMITY (Left ) APPLICATION OF A-CELL OF EXTREMITY AND WOUND VAC (Left )  Patient Location: PACU  Anesthesia Type:General  Level of Consciousness: awake, alert  and oriented  Airway & Oxygen Therapy: Patient Spontanous Breathing and Patient connected to nasal cannula oxygen  Post-op Assessment: Report given to RN and Post -op Vital signs reviewed and stable  Post vital signs: Reviewed and stable  Last Vitals:  Vitals:   01/19/17 1415 01/19/17 1639  BP: (!) 127/52   Pulse: 72   Resp: 20   Temp: 36.9 C (P) 36.6 C  SpO2: 100%     Last Pain:  Vitals:   01/19/17 1415  TempSrc: Oral  PainSc:       Patients Stated Pain Goal: 2 (66/44/03 4742)  Complications: No apparent anesthesia complications

## 2017-01-19 NOTE — Plan of Care (Signed)
  Progressing Education: Knowledge of General Education information will improve 01/19/2017 0637 - Progressing by Fransisco Hertz, RN Health Behavior/Discharge Planning: Ability to manage health-related needs will improve 01/19/2017 0637 - Progressing by Fransisco Hertz, RN Clinical Measurements: Ability to maintain clinical measurements within normal limits will improve 01/19/2017 0637 - Progressing by Fransisco Hertz, RN Will remain free from infection 01/19/2017 0223 - Progressing by Fransisco Hertz, RN Diagnostic test results will improve 01/19/2017 [phone] - Progressing by Fransisco Hertz, RN Activity: Risk for activity intolerance will decrease 01/19/2017 0637 - Progressing by Fransisco Hertz, RN Coping: Level of anxiety will decrease 01/19/2017 0637 - Progressing by Fransisco Hertz, RN Skin Integrity: Risk for impaired skin integrity will decrease 01/19/2017 0637 - Progressing by Fransisco Hertz, RN Clinical Measurements: Postoperative complications will be avoided or minimized 01/19/2017 0637 - Progressing by Fransisco Hertz, RN Skin Integrity: Demonstration of wound healing without infection will improve 01/19/2017 2449 - Progressing by Fransisco Hertz, RN

## 2017-01-19 NOTE — Op Note (Signed)
DATE OF OPERATION: 01/19/2017  LOCATION: [facility]  PREOPERATIVE DIAGNOSIS: left leg wounds  POSTOPERATIVE DIAGNOSIS: Same  PROCEDURE: Excision of the total area 10 x 15 cm wound of left leg with placement of Acell (1 gm powder and 7 x 10 cm sheet) and VAC  SURGEON: Claire Sanger Dillingham, DO  EBL: 10 cc  CONDITION: Stable  COMPLICATIONS: None  INDICATION: The patient, [name], is a 32 y.o. female born on [birth year], is here for treatment of a chronic left leg wound.   PROCEDURE DETAILS:  The patient was seen prior to surgery and marked.  The IV antibiotics were given. The patient was taken to the operating room and given a general anesthetic. A standard time out was performed and all information was confirmed by those in the room. SCD placed on right leg.   The left leg was prepped and draped in the usual sterile fashion.  The leg was irrigated with antibiotic solution and saline.  The total area was 10 x 15 cm in size with four wound total.  The #10 blade was used to excise the skin and soft tissue of the total wound that was nonviable and fibrous in nature.  The curette was additionally used at the base for excision of fibrous tissue.   Hemostasis was achieved with electrocautery. All of the Acell powder and sheet were applied and secured with the 5-0 Vicryl.  The sorbact was placed with KY gel.  The VAC was applied.  There was an excellent seal.  The leg was wrapped with kerlex and an ace wrap. The patient was allowed to wake up and taken to recovery room in stable condition at the end of the case. The family was notified at the end of the case.

## 2017-01-19 NOTE — Progress Notes (Signed)
Patient sent to OR with her wound vac machine as instructed by [facility] nurse, dressing applied to wound as ordered.

## 2017-01-19 NOTE — Progress Notes (Addendum)
Subjective: 3 Days Post-Op Procedure(s) (LRB): DEBRIDEMENT OF LEFT ANTERIOR AND POSTERLATERAL TIBIAL WOUNDS (Left) APPLICATION OF WOUND VAC (Left) Patient reports pain as mild.    Objective: Vital signs in last 24 hours: Temp:  [98 F (36.7 C)-98.5 F (36.9 C)] 98 F (36.7 C) (01/17 0458) Pulse Rate:  [72-73] 73 (01/17 0458) Resp:  [19] 19 (01/17 0458) BP: (111-113)/(54-56) 111/54 (01/17 0458) SpO2:  [97 %-100 %] 97 % (01/17 0458)  Intake/Output from previous day: 01/16 0701 - 01/17 0700 In: 2130 [P.O.:1440; IV Piggyback:650] Out: 500 [Drains:500] Intake/Output this shift: No intake/output data recorded.  Recent Labs    01/17/17 0833 01/18/17 0415 01/19/17 0619  HGB 10.0* 9.0* 9.4*   Recent Labs    01/18/17 0415 01/19/17 0619  WBC 7.6 8.2  RBC 3.71* 3.91  HCT 29.0* 30.5*  PLT 335 336   Recent Labs    01/17/17 0833 01/18/17 0415  NA 139 138  K 4.1 3.6  CL 101 101  CO2 26 25  BUN 5* 7  CREATININE 0.63 0.69  GLUCOSE 131* 120*  CALCIUM 9.2 8.9  Cultures left lower extremity grew Pseudomonas. No results for input(s): LABPT, INR in the last 72 hours. Left leg exam: VAC dressing removed.  Wound bed is vascular, but much cleaner appearing.  Definitely full-thickness.  Assessment/Plan: 3 Days Post-Op Procedure(s) (LRB): DEBRIDEMENT OF LEFT ANTERIOR AND POSTERLATERAL TIBIAL WOUNDS (Left) APPLICATION OF WOUND VAC (Left) Plan: VAC dressing removed. Dr. Shirlee Limerick will contact plastic surgery, Dr. Marla Roe for her thoughts regarding further treatment possibly including skin grafts versus ACell with VAC dressing.  At this point will reapply a regular VAC by nursing staff and determine plan from there. Continue antibiotics per infectious disease.  Dr. Berenice Primas phone number is [phone]  [name] 01/19/2017, 8:18 AM   I have reviewed thwe case with Dr Marla Roe who may be able to day to take the patient tio the OR for additional wound management and save her  additional hospital time given how well her wounds look tioday.  I have made her NPO in anticipation of that trip to the OR today.

## 2017-01-19 NOTE — Consult Note (Signed)
Reason for Consult: Left leg wound Referring Physician: Dr. Dorna Leitz  [name] is an 32 y.o. female.  HPI: The patient is a 32 yrs old female who states she developed a wound ~ 2 months ago on the anterior aspect of her left leg.  She is not aware of any trauma or spider bites.  She is diabetic.  She also admits that she has suffered with wounds on her legs for years. She works full time at a [employer].  She is on her feet for long periods of time and occasionally wears compression socks for the swelling.  She was taken to the OR two days ago for debridement.  The wounds are improving.  The patient had the VAC on till this morning.  She has a complex social history and may have been exposed to bed bugs, fleas, etc.  She states to have been clean off drugs for the past 2 years.  Past Medical History:  Diagnosis Date  . Diabetes mellitus without complication ([place])    with pregnancy    Past Surgical History:  Procedure Laterality Date  . APPLICATION OF WOUND VAC Left 01/16/2017   Procedure: APPLICATION OF WOUND VAC;  Surgeon: Dorna Leitz, MD;  Location: [facility];  Service: Orthopedics;  Laterality: Left;  . CESAREAN SECTION    . I&D EXTREMITY Left 01/16/2017   Procedure: DEBRIDEMENT OF LEFT ANTERIOR AND POSTERLATERAL TIBIAL WOUNDS;  Surgeon: Dorna Leitz, MD;  Location: [facility];  Service: Orthopedics;  Laterality: Left;    History reviewed. No pertinent family history.  Social History:  reports that she has been smoking cigarettes.  She has been smoking about 0.50 packs per day. she has never used smokeless tobacco. She reports that she does not drink alcohol or use drugs.  Allergies:  Allergies  Allergen Reactions  . Tegretol [Carbamazepine] Other (See Comments)    Causes aplastic anemia  . Ambien [Zolpidem Tartrate] Rash    Medications: I have reviewed the patient's current medications.  Results for orders placed or performed during the hospital encounter of 01/16/17  (from the past 48 hour(s))  CBC with Differential/Platelet     Status: Abnormal   Collection Time: 01/18/17  4:15 AM  Result Value Ref Range   WBC 7.6 4.0 - 10.5 K/uL   RBC 3.71 (L) 3.87 - 5.11 MIL/uL   Hemoglobin 9.0 (L) 12.0 - 15.0 g/dL   HCT 29.0 (L) 36.0 - 46.0 %   MCV 78.2 78.0 - 100.0 fL   MCH 24.3 (L) 26.0 - 34.0 pg   MCHC 31.0 30.0 - 36.0 g/dL   RDW 15.5 11.5 - 15.5 %   Platelets 335 150 - 400 K/uL   Neutrophils Relative % 45 %   Neutro Abs 3.4 1.7 - 7.7 K/uL   Lymphocytes Relative 44 %   Lymphs Abs 3.4 0.7 - 4.0 K/uL   Monocytes Relative 7 %   Monocytes Absolute 0.6 0.1 - 1.0 K/uL   Eosinophils Relative 3 %   Eosinophils Absolute 0.2 0.0 - 0.7 K/uL   Basophils Relative 1 %   Basophils Absolute 0.1 0.0 - 0.1 K/uL  Comprehensive metabolic panel     Status: Abnormal   Collection Time: 01/18/17  4:15 AM  Result Value Ref Range   Sodium 138 135 - 145 mmol/L   Potassium 3.6 3.5 - 5.1 mmol/L   Chloride 101 101 - 111 mmol/L   CO2 25 22 - 32 mmol/L   Glucose, Bld 120 (H) 65 -  99 mg/dL   BUN 7 6 - 20 mg/dL   Creatinine, Ser 0.69 0.44 - 1.00 mg/dL   Calcium 8.9 8.9 - 10.3 mg/dL   Total Protein 6.3 (L) 6.5 - 8.1 g/dL   Albumin 2.9 (L) 3.5 - 5.0 g/dL   AST 17 15 - 41 U/L   ALT 24 14 - 54 U/L   Alkaline Phosphatase 93 38 - 126 U/L   Total Bilirubin 0.5 0.3 - 1.2 mg/dL   GFR calc non Af Amer >60 >60 mL/min   GFR calc Af Amer >60 >60 mL/min    Comment: (NOTE) The eGFR has been calculated using the CKD EPI equation. This calculation has not been validated in all clinical situations. eGFR's persistently <60 mL/min signify possible Chronic Kidney Disease.    Anion gap 12 5 - 15  Glucose, capillary     Status: Abnormal   Collection Time: 01/18/17  7:40 AM  Result Value Ref Range   Glucose-Capillary 101 (H) 65 - 99 mg/dL  CBC with Differential/Platelet     Status: Abnormal   Collection Time: 01/19/17  6:19 AM  Result Value Ref Range   WBC 8.2 4.0 - 10.5 K/uL   RBC 3.91  3.87 - 5.11 MIL/uL   Hemoglobin 9.4 (L) 12.0 - 15.0 g/dL   HCT 30.5 (L) 36.0 - 46.0 %   MCV 78.0 78.0 - 100.0 fL   MCH 24.0 (L) 26.0 - 34.0 pg   MCHC 30.8 30.0 - 36.0 g/dL   RDW 15.3 11.5 - 15.5 %   Platelets 336 150 - 400 K/uL   Neutrophils Relative % 54 %   Neutro Abs 4.5 1.7 - 7.7 K/uL   Lymphocytes Relative 35 %   Lymphs Abs 2.9 0.7 - 4.0 K/uL   Monocytes Relative 6 %   Monocytes Absolute 0.5 0.1 - 1.0 K/uL   Eosinophils Relative 4 %   Eosinophils Absolute 0.3 0.0 - 0.7 K/uL   Basophils Relative 1 %   Basophils Absolute 0.1 0.0 - 0.1 K/uL  Glucose, capillary     Status: Abnormal   Collection Time: 01/19/17  7:54 AM  Result Value Ref Range   Glucose-Capillary 124 (H) 65 - 99 mg/dL    No results found.  Review of Systems  Constitutional: Negative.   HENT: Negative.   Eyes: Negative.   Respiratory: Negative.   Cardiovascular: Negative.   Gastrointestinal: Negative.   Genitourinary: Negative.   Musculoskeletal: Negative.   Skin: Negative.   Psychiatric/Behavioral: Negative.    Blood pressure (!) 111/54, pulse 73, temperature 98 F (36.7 C), temperature source Oral, resp. rate 19, height 5' 9"  (1.753 m), weight 136.1 kg (300 lb), last menstrual period 12/21/2016, SpO2 97 %. Physical Exam  Constitutional: She appears well-developed and well-nourished.  HENT:  Head: Normocephalic and atraumatic.  Cardiovascular: Normal rate.  Respiratory: Effort normal. No respiratory distress.  GI: Soft. She exhibits no distension. There is no tenderness.  Musculoskeletal: She exhibits edema and tenderness.  Neurological: She is alert.  Psychiatric: She has a normal mood and affect. Her behavior is normal. Judgment and thought content normal.    Assessment/Plan: Plan for debridement and placement of Acell and VAC.  Loel Lofty Prem Coykendall 01/19/2017, 1:26 PM

## 2017-01-19 NOTE — Interval H&P Note (Signed)
History and Physical Interval Note:  01/19/2017 3:31 PM  [name]  has presented today for surgery, with the diagnosis of infected left lower leg wound  The various methods of treatment have been discussed with the patient and family. After consideration of risks, benefits and other options for treatment, the patient has consented to  Procedure(s): IRRIGATION AND DEBRIDEMENT EXTREMITY (Left) APPLICATION OF A-CELL OF EXTREMITY AND WOUND VAC (Left) as a surgical intervention .  The patient's history has been reviewed, patient examined, no change in status, stable for surgery.  I have reviewed the patient's chart and labs.  Questions were answered to the patient's satisfaction.     Loel Lofty [name]

## 2017-01-19 NOTE — Progress Notes (Signed)
Family Medicine Teaching Service Daily Progress Note Intern Pager: [phone]  Patient name: [name] Medical record number: [id number] Date of birth: 1985/08/12 Age: 32 y.o. Gender: female  Primary Care Provider: Patient, No Pcp Per Consultants: Orthopedics, wound care Code Status: DNR/DNI  Pt Overview and Major Events to Date:  1/14 admitted to fpts, I&D of LLE  Assessment and Plan: [name] is a 32 y.o. female presenting with 6 week history of developing LLE ulcer.  Left Lower Extremity Ulcer Patient had I&D of LLE ulcers on 1/14. Cultures of wound showing gram positive cocci, gram negative rods, pseudomonas. Wound cultures with growth of pseudomonas and E.Coli. Possible growth of enterobacter cloacae. Blood cultures negative for 3 days. Per ortho note, patient possibly to go to OR with PRS today. NPO in anticipation of that. Wound management per plastics and ortho. Punch bx negative for malignancy. Will follow up recs depending on procedure done. On zosyn per ID. Appreciate ID, Plastics, and orthopedics input. - vital signs per floor routine - pulse ox with vitals - ABX per ID recommendations - zosyn per pharmacy consult - follow up wound cultures, and speciation - F/U Plastics recs - lovenox for dvt ppx - NPO for now - zofran for nausea - tylenol and ibuprofen for pain  Possible Type II diabetes A1C 6.5. Glucoses 131. No indication for insulin at this time. Could consider adding sliding scale if needed.  FEN/GI: regular diet PPx: lovenox  Disposition: home  Subjective:  NO complaints. Tolerating diet. No acute events overnight.  Objective: Temp:  [98 F (36.7 C)-98.5 F (36.9 C)] 98 F (36.7 C) (01/17 0458) Pulse Rate:  [72-73] 73 (01/17 0458) Resp:  [19] 19 (01/17 0458) BP: (111-113)/(54-56) 111/54 (01/17 0458) SpO2:  [97 %-100 %] 97 % (01/17 0458) Physical Exam: General: obese, caucasian female, resting comfortably in no acute  distress Cardiovascular: regular rate and rythm , no m/r/g, palpable radial pulse.  Respiratory: lungs CTAB, no chest pain Gastrointestinal: soft, non-tender, non-distended MSK: 5/5 strength all muscle groups BLE, BUE Derm: warm and dry, LLE wrapped, [place] wound vac in place Neuro: cn 2-12 intact, no focal neuro deficit Psych: appropriate   Laboratory: Recent Labs  Lab 01/17/17 0833 01/18/17 0415 01/19/17 0619  WBC 8.6 7.6 8.2  HGB 10.0* 9.0* 9.4*  HCT 33.0* 29.0* 30.5*  PLT 357 335 336   Recent Labs  Lab 01/16/17 0152 01/17/17 0833 01/18/17 0415  NA 136 139 138  K 3.7 4.1 3.6  CL 101 101 101  CO2 22 26 25   BUN 7 5* 7  CREATININE 0.74 0.63 0.69  CALCIUM 9.2 9.2 8.9  PROT 7.9 7.3 6.3*  BILITOT 0.6 0.3 0.5  ALKPHOS 141* 110 93  ALT 39 29 24  AST 31 21 17   GLUCOSE 145* 131* 120*    Imaging/Diagnostic Tests: CLINICAL DATA:  LEFT lower extremity drainage with venous stasis ulcers.  EXAM: LEFT TIBIA AND FIBULA - 2 VIEW  COMPARISON:  None.  FINDINGS: There is no evidence of fracture or other focal bone lesions. Soft tissue swelling with multiple skin defects in the anterolateral distal tibiofibular soft tissues. No subcutaneous gas or radiopaque foreign bodies.  IMPRESSION: Distal tibia and fibular ulceration without subcutaneous gas, radiopaque foreign bodies or acute osseous process.  CLINICAL DATA:  LEFT lower extremity drainage with venous stasis ulcers.  EXAM: LEFT TIBIA AND FIBULA - 2 VIEW  COMPARISON:  None.  FINDINGS: There is no evidence of fracture or other focal bone lesions. Soft tissue  swelling with multiple skin defects in the anterolateral distal tibiofibular soft tissues. No subcutaneous gas or radiopaque foreign bodies.  IMPRESSION: Distal tibia and fibular ulceration without subcutaneous gas, radiopaque foreign bodies or acute osseous process.  Guadalupe Dawn, MD 01/19/2017, 11:51 AM PGY-1, [place] Intern pager: [phone], text pages welcome

## 2017-01-19 NOTE — Anesthesia Procedure Notes (Signed)
Procedure Name: LMA Insertion Date/Time: 01/19/2017 3:50 PM Performed by: Candis Shine, CRNA Pre-anesthesia Checklist: Patient identified, Emergency Drugs available, Suction available and Patient being monitored Patient Re-evaluated:Patient Re-evaluated prior to induction Oxygen Delivery Method: Circle System Utilized Preoxygenation: Pre-oxygenation with 100% oxygen Induction Type: IV induction LMA: LMA inserted LMA Size: 4.0 Number of attempts: 1 Placement Confirmation: positive ETCO2 Tube secured with: Tape Dental Injury: Teeth and Oropharynx as per pre-operative assessment

## 2017-01-19 NOTE — Care Management Note (Signed)
Case Management Note  Patient Details  Name: Nalany Steedley MRN: [id number] Date of Birth: 08/13/1985  Subjective/Objective:                    Action/Plan:  Follow up appointment scheduled . Patient has information on same . [place] letter provided and explained .   Patient voiced understanding to all of above.  Patient states she may not be able to return to her friends address. Encouraged patient to call friend to check and call other family members / friends to see if she will have a place to stay at discharge.  SW aware and will speak to patient also. Expected Discharge Date:                  Expected Discharge Plan:  [place]  In-House Referral:  Clinical Social Work, Development worker, community  Discharge planning Services  CM Consult, [facility], [place], Medication Assistance, Other - See comment  Post Acute Care Choice:  Durable Medical Equipment, Home Health Choice offered to:  Patient  DME Arranged:  Vac DME Agency:  [facility]  HH Arranged:  RN [place] Agency:  [facility]  Status of Service:  In process, will continue to follow  If discussed at Long Length of Stay Meetings, dates discussed:    Additional Comments:  Marilu Favre, RN 01/19/2017, 2:17 PM

## 2017-01-20 ENCOUNTER — Encounter (HOSPITAL_COMMUNITY): Payer: Self-pay | Admitting: Plastic Surgery

## 2017-01-20 DIAGNOSIS — L089 Local infection of the skin and subcutaneous tissue, unspecified: Secondary | ICD-10-CM

## 2017-01-20 DIAGNOSIS — B9689 Other specified bacterial agents as the cause of diseases classified elsewhere: Secondary | ICD-10-CM

## 2017-01-20 DIAGNOSIS — L97909 Non-pressure chronic ulcer of unspecified part of unspecified lower leg with unspecified severity: Secondary | ICD-10-CM

## 2017-01-20 DIAGNOSIS — B965 Pseudomonas (aeruginosa) (mallei) (pseudomallei) as the cause of diseases classified elsewhere: Secondary | ICD-10-CM

## 2017-01-20 DIAGNOSIS — B1920 Unspecified viral hepatitis C without hepatic coma: Secondary | ICD-10-CM

## 2017-01-20 DIAGNOSIS — B9620 Unspecified Escherichia coli [E. coli] as the cause of diseases classified elsewhere: Secondary | ICD-10-CM

## 2017-01-20 LAB — AEROBIC CULTURE  (SUPERFICIAL SPECIMEN)

## 2017-01-20 LAB — CBC WITH DIFFERENTIAL/PLATELET
BASOS PCT: 0 %
Basophils Absolute: 0 10*3/uL (ref 0.0–0.1)
EOS ABS: 0 10*3/uL (ref 0.0–0.7)
EOS PCT: 0 %
HCT: 35 % — ABNORMAL LOW (ref 36.0–46.0)
Hemoglobin: 11 g/dL — ABNORMAL LOW (ref 12.0–15.0)
LYMPHS ABS: 1.8 10*3/uL (ref 0.7–4.0)
Lymphocytes Relative: 18 %
MCH: 24.2 pg — AB (ref 26.0–34.0)
MCHC: 31.4 g/dL (ref 30.0–36.0)
MCV: 77.1 fL — ABNORMAL LOW (ref 78.0–100.0)
MONOS PCT: 4 %
Monocytes Absolute: 0.4 10*3/uL (ref 0.1–1.0)
Neutro Abs: 7.6 10*3/uL (ref 1.7–7.7)
Neutrophils Relative %: 78 %
PLATELETS: 410 10*3/uL — AB (ref 150–400)
RBC: 4.54 MIL/uL (ref 3.87–5.11)
RDW: 14.7 % (ref 11.5–15.5)
WBC: 9.8 10*3/uL (ref 4.0–10.5)

## 2017-01-20 LAB — BASIC METABOLIC PANEL
Anion gap: 13 (ref 5–15)
BUN: 11 mg/dL (ref 6–20)
CALCIUM: 9.5 mg/dL (ref 8.9–10.3)
CO2: 25 mmol/L (ref 22–32)
CREATININE: 0.7 mg/dL (ref 0.44–1.00)
Chloride: 99 mmol/L — ABNORMAL LOW (ref 101–111)
GFR calc Af Amer: >60 mL/min (ref >60)
Glucose, Bld: 142 mg/dL — ABNORMAL HIGH (ref 65–99)
Potassium: 4.3 mmol/L (ref 3.5–5.1)
SODIUM: 137 mmol/L (ref 135–145)

## 2017-01-20 LAB — AEROBIC CULTURE W GRAM STAIN (SUPERFICIAL SPECIMEN)

## 2017-01-20 LAB — GLUCOSE, CAPILLARY: GLUCOSE-CAPILLARY: 139 mg/dL — AB (ref 65–99)

## 2017-01-20 LAB — COMMENT2 - HEP PANEL

## 2017-01-20 LAB — HEPATITIS C ANTIBODY (REFLEX): HCV Ab: >11 s/co ratio — ABNORMAL HIGH (ref 0.0–0.9)

## 2017-01-20 NOTE — Progress Notes (Signed)
1 Day Post-Op   Subjective/Chief Complaint: No problems following placement of Acell and VAC dressing.  Ready for discharge from plastic surgery standpoint, with follow up next Friday in the office.    Objective: Vital signs in last 24 hours: Temp:  [97.8 F (36.6 C)-98.9 F (37.2 C)] 97.8 F (36.6 C) (01/18 1300) Pulse Rate:  [77-89] 81 (01/18 1300) Resp:  [16-18] 18 (01/18 1300) BP: (103-134)/(52-74) 107/52 (01/18 1300) SpO2:  [91 %-99 %] 94 % (01/18 1300) Last BM Date: 01/20/17  Intake/Output from previous day: 01/17 0701 - 01/18 0700 In: 890 [P.O.:240; I.V.:500; IV Piggyback:150] Out: 1010 [Urine:1000; Blood:10] Intake/Output this shift: Total I/O In: 720 [P.O.:720] Out: 900 [Urine:900]  General appearance: alert, cooperative and no distress Left lower leg VAC dressing in place and functioning well  Lab Results:  Recent Labs    01/19/17 0619 01/20/17 0634  WBC 8.2 9.8  HGB 9.4* 11.0*  HCT 30.5* 35.0*  PLT 336 410*   BMET Recent Labs    01/18/17 0415 01/20/17 0634  NA 138 137  K 3.6 4.3  CL 101 99*  CO2 25 25  GLUCOSE 120* 142*  BUN 7 11  CREATININE 0.69 0.70  CALCIUM 8.9 9.5   PT/INR No results for input(s): LABPROT, INR in the last 72 hours. ABG No results for input(s): PHART, HCO3 in the last 72 hours.  Invalid input(s): PCO2, PO2  Studies/Results: No results found.  Anti-infectives: Anti-infectives (From admission, onward)   Start     Dose/Rate Route Frequency Ordered Stop   01/19/17 1600  polymyxin B 500,000 Units, bacitracin 50,000 Units in sodium chloride 0.9 % 500 mL irrigation  Status:  Discontinued       As needed 01/19/17 1643 01/19/17 1644   01/18/17 2000  piperacillin-tazobactam (ZOSYN) IVPB 3.375 g     3.375 g 12.5 mL/hr over 240 Minutes Intravenous Every 8 hours 01/18/17 1710     01/17/17 1200  vancomycin (VANCOCIN) IVPB 1000 mg/200 mL premix  Status:  Discontinued     1,000 mg 200 mL/hr over 60 Minutes Intravenous Every 8  hours 01/17/17 1118 01/18/17 1707   01/16/17 1445  ceFAZolin (ANCEF) 3 g in dextrose 5 % 50 mL IVPB     3 g 130 mL/hr over 30 Minutes Intravenous On call to O.R. 01/16/17 1435 01/16/17 1643   01/16/17 1200  vancomycin (VANCOCIN) IVPB 1000 mg/200 mL premix  Status:  Discontinued     1,000 mg 200 mL/hr over 60 Minutes Intravenous Every 8 hours 01/16/17 0311 01/17/17 1118   01/16/17 1100  piperacillin-tazobactam (ZOSYN) IVPB 3.375 g  Status:  Discontinued     3.375 g 12.5 mL/hr over 240 Minutes Intravenous Every 8 hours 01/16/17 1020 01/18/17 1407   01/16/17 0330  vancomycin (VANCOCIN) 2,500 mg in sodium chloride 0.9 % 500 mL IVPB     2,500 mg 250 mL/hr over 120 Minutes Intravenous  Once 01/16/17 0248 01/16/17 0632   01/16/17 0245  piperacillin-tazobactam (ZOSYN) IVPB 3.375 g     3.375 g 100 mL/hr over 30 Minutes Intravenous  Once 01/16/17 0243 01/16/17 0338      Assessment/Plan: s/p Procedure(s): IRRIGATION AND DEBRIDEMENT EXTREMITY (Left) APPLICATION OF A-CELL OF EXTREMITY AND WOUND VAC (Left) Okay for discharge from plastic surgery standpoint  Home VAC in room.  Will not need VAC dressing change until seen in clinic next week on Friday.  Home health nursing to monitor [facility].  LOS: 4 days   Andres Escandon,PA-C Plastic Surgery [phone]

## 2017-01-20 NOTE — Progress Notes (Signed)
Family Medicine Teaching Service Daily Progress Note Intern Pager: [phone]  Patient name: [name] Medical record number: [id number] Date of birth: Jul 30, 1985 Age: 32 y.o. Gender: female  Primary Care Provider: Patient, No Pcp Per Consultants: Plastics Surgery, Orthopedics, wound care Code Status: DNR/DNI   Pt Overview and Major Events to Date:  1/14 admitted to fpts, I&D of LLE 1/17 ACELL placement LLE  Assessment and Plan: [name] is a 32 y.o. female presenting with 6 week history of developing LLE ulcer.  Left Lower Extremity Ulcer Patient had I&D of LLE ulcers on 1/14. Cultures of wound showing gram positive cocci, gram negative rods, pseudomonas. Wound cultures with growth of pseudomonas and E.Coli. Possible growth of enterobacter cloacae. Blood cultures negative for 4 days. Taken to OR with PRS 1/17. ACELL placed with vac change. Appears that cultured bacterai may not be contributing to ulcers per ID note. Their recs continue zosyn 2 more days and then cipro for 2 day in setting of debrided necrotic tissue. Recommend outpatient follow up with dermatology for possible pyoderma gangrenosum. Wound care per Ortho and PRS. - vital signs per floor routine - pulse ox with vitals - ABX per ID recommendations - zosyn per pharmacy consult - follow up wound cultures, and speciation - F/U Plastics recs - lovenox for dvt ppx - regular diet - zofran for nausea - tylenol and ibuprofen for pain  Possible Type II diabetes A1C 6.5. Glucoses 131. No indication for insulin at this time. Could consider adding sliding scale if needed.  FEN/GI: regular diet PPx: lovenox  Disposition: home  Subjective:  Doing well this morning. Only complaint is boredom.   Objective: Temp:  [97.8 F (36.6 C)-98.9 F (37.2 C)] 97.8 F (36.6 C) (01/18 1300) Pulse Rate:  [72-89] 81 (01/18 1300) Resp:  [10-19] 18 (01/18 1300) BP: (103-134)/(52-79) 107/52 (01/18 1300) SpO2:  [87 %-99 %] 94 %  (01/18 1300) Physical Exam: General: obese, caucasian female, resting comfortably in no acute distress Cardiovascular: regular rate and rythm , no m/r/g, palpable radial pulse.  Respiratory: lungs CTAB, no chest pain Gastrointestinal: soft, non-tender, non-distended MSK: 5/5 strength all muscle groups BLE, BUE Derm: warm and dry, LLE wrapped in ace wrap with kerlex poking out. Vac in place. Neuro: cn 2-12 intact, no focal neuro deficit Psych: appropriate   Laboratory: Recent Labs  Lab 01/18/17 0415 01/19/17 0619 01/20/17 0634  WBC 7.6 8.2 9.8  HGB 9.0* 9.4* 11.0*  HCT 29.0* 30.5* 35.0*  PLT 335 336 410*   Recent Labs  Lab 01/16/17 0152 01/17/17 0833 01/18/17 0415 01/20/17 0634  NA 136 139 138 137  K 3.7 4.1 3.6 4.3  CL 101 101 101 99*  CO2 22 26 25 25   BUN 7 5* 7 11  CREATININE 0.74 0.63 0.69 0.70  CALCIUM 9.2 9.2 8.9 9.5  PROT 7.9 7.3 6.3*  --   BILITOT 0.6 0.3 0.5  --   ALKPHOS 141* 110 93  --   ALT 39 29 24  --   AST 31 21 17   --   GLUCOSE 145* 131* 120* 142*    Imaging/Diagnostic Tests: CLINICAL DATA:  LEFT lower extremity drainage with venous stasis ulcers.  EXAM: LEFT TIBIA AND FIBULA - 2 VIEW  COMPARISON:  None.  FINDINGS: There is no evidence of fracture or other focal bone lesions. Soft tissue swelling with multiple skin defects in the anterolateral distal tibiofibular soft tissues. No subcutaneous gas or radiopaque foreign bodies.  IMPRESSION: Distal tibia and fibular ulceration  without subcutaneous gas, radiopaque foreign bodies or acute osseous process.  CLINICAL DATA:  LEFT lower extremity drainage with venous stasis ulcers.  EXAM: LEFT TIBIA AND FIBULA - 2 VIEW  COMPARISON:  None.  FINDINGS: There is no evidence of fracture or other focal bone lesions. Soft tissue swelling with multiple skin defects in the anterolateral distal tibiofibular soft tissues. No subcutaneous gas or radiopaque foreign  bodies.  IMPRESSION: Distal tibia and fibular ulceration without subcutaneous gas, radiopaque foreign bodies or acute osseous process.  Guadalupe Dawn, MD 01/20/2017, 3:03 PM PGY-1, [place] Intern pager: [phone], text pages welcome

## 2017-01-20 NOTE — Progress Notes (Signed)
[facility] Infectious Disease   Reason for visit: Follow up on leg ulcers  Interval History: went to OR yesterday with Dr. Marla Roe for debridement, no fever, cultures of ulcers with Pseudomonas and Enterobacter.  Also E coli in previous culture   Physical Exam: Constitutional:  Vitals:   01/20/17 0116 01/20/17 0619  BP: 130/65 134/74  Pulse: 78 84  Resp: 16 17  Temp: 98.9 F (37.2 C) 98.7 F (37.1 C)  SpO2: 91% 92%   patient appears in NAD Eyes: anicteric HENT: no thrush Respiratory: Normal respiratory effort; CTA B Cardiovascular: RRR GI: soft, nt, nd  Review of Systems: Constitutional: negative for fevers and chills  Lab Results  Component Value Date   WBC 9.8 01/20/2017   HGB 11.0 (L) 01/20/2017   HCT 35.0 (L) 01/20/2017   MCV 77.1 (L) 01/20/2017   PLT 410 (H) 01/20/2017    Lab Results  Component Value Date   CREATININE 0.70 01/20/2017   BUN 11 01/20/2017   NA 137 01/20/2017   K 4.3 01/20/2017   CL 99 (L) 01/20/2017   CO2 25 01/20/2017    Lab Results  Component Value Date   ALT 24 01/18/2017   AST 17 01/18/2017   ALKPHOS 93 01/18/2017     Microbiology: Recent Results (from the past 240 hour(s))  Culture, blood (Routine x 2)     Status: None (Preliminary result)   Collection Time: 01/16/17  1:52 AM  Result Value Ref Range Status   Specimen Description BLOOD RIGHT HAND  Final   Special Requests   Final    BOTTLES DRAWN AEROBIC AND ANAEROBIC Blood Culture adequate volume   Culture NO GROWTH 3 DAYS  Final   Report Status PENDING  Incomplete  Culture, blood (Routine x 2)     Status: None (Preliminary result)   Collection Time: 01/16/17  1:59 AM  Result Value Ref Range Status   Specimen Description BLOOD LEFT HAND  Final   Special Requests   Final    BOTTLES DRAWN AEROBIC ONLY Blood Culture adequate volume   Culture NO GROWTH 3 DAYS  Final   Report Status PENDING  Incomplete  Wound or Superficial Culture     Status: None (Preliminary  result)   Collection Time: 01/16/17  3:10 AM  Result Value Ref Range Status   Specimen Description WOUND LEFT LEG  Final   Special Requests NONE  Final   Gram Stain   Final    RARE WBC PRESENT, PREDOMINANTLY PMN MODERATE GRAM POSITIVE COCCI IN PAIRS MODERATE GRAM NEGATIVE RODS RARE GRAM POSITIVE RODS    Culture   Final    ABUNDANT PSEUDOMONAS AERUGINOSA CULTURE REINCUBATED FOR BETTER GROWTH    Report Status PENDING  Incomplete   Organism ID, Bacteria PSEUDOMONAS AERUGINOSA  Final      Susceptibility   Pseudomonas aeruginosa - MIC*    CEFTAZIDIME 2 SENSITIVE Sensitive     CIPROFLOXACIN <=0.25 SENSITIVE Sensitive     GENTAMICIN <=1 SENSITIVE Sensitive     IMIPENEM 1 SENSITIVE Sensitive     PIP/TAZO <=4 SENSITIVE Sensitive     CEFEPIME <=1 SENSITIVE Sensitive     * ABUNDANT PSEUDOMONAS AERUGINOSA  Surgical pcr screen     Status: None   Collection Time: 01/16/17  2:52 PM  Result Value Ref Range Status   MRSA, PCR NEGATIVE NEGATIVE Final   Staphylococcus aureus NEGATIVE NEGATIVE Final    Comment: (NOTE) The Xpert SA Assay (FDA approved for NASAL specimens in  patients 57 years of age and older), is one component of a comprehensive surveillance program. It is not intended to diagnose infection nor to guide or monitor treatment.   Fungus Culture With Stain     Status: None   Collection Time: 01/16/17  5:18 PM  Result Value Ref Range Status   Fungus Stain Final report  Final    Comment: (NOTE) Performed At: [facility] [place], [place] [id number] Rush Farmer MD [id number]    Fungus (Mycology) Culture PENDING  Incomplete   Fungal Source TISSUE  Corrected    Comment: LEFT LOWER LEG CORRECTED ON 01/14 AT 1932: PREVIOUSLY REPORTED AS WOUND LEFT LOWER LEG   Aerobic/Anaerobic Culture (surgical/deep wound)     Status: None (Preliminary result)   Collection Time: 01/16/17  5:18 PM  Result Value Ref Range Status   Specimen Description TISSUE LEFT  LOWER LEG  Final   Special Requests PT ON ANCEF  Final   Gram Stain   Final    RARE WBC PRESENT, PREDOMINANTLY PMN NO ORGANISMS SEEN    Culture   Final    RARE PSEUDOMONAS AERUGINOSA RARE ESCHERICHIA COLI CRITICAL RESULT CALLED TO, READ BACK BY AND VERIFIED WITH: J. KIANG RN, AT [phone] 01/18/17 BY D. Victoriano Lain REGARDING CULTURE GROWTH NO ANAEROBES ISOLATED; CULTURE IN PROGRESS FOR 5 DAYS    Report Status PENDING  Incomplete   Organism ID, Bacteria PSEUDOMONAS AERUGINOSA  Final   Organism ID, Bacteria ESCHERICHIA COLI  Final      Susceptibility   Escherichia coli - MIC*    AMPICILLIN >=32 RESISTANT Resistant     CEFAZOLIN 8 SENSITIVE Sensitive     CEFEPIME <=1 SENSITIVE Sensitive     CEFTAZIDIME <=1 SENSITIVE Sensitive     CEFTRIAXONE <=1 SENSITIVE Sensitive     CIPROFLOXACIN >=4 RESISTANT Resistant     GENTAMICIN <=1 SENSITIVE Sensitive     IMIPENEM <=0.25 SENSITIVE Sensitive     TRIMETH/SULFA <=20 SENSITIVE Sensitive     AMPICILLIN/SULBACTAM >=32 RESISTANT Resistant     PIP/TAZO <=4 SENSITIVE Sensitive     Extended ESBL NEGATIVE Sensitive     * RARE ESCHERICHIA COLI   Pseudomonas aeruginosa - MIC*    CEFTAZIDIME 2 SENSITIVE Sensitive     CIPROFLOXACIN <=0.25 SENSITIVE Sensitive     GENTAMICIN <=1 SENSITIVE Sensitive     IMIPENEM 1 SENSITIVE Sensitive     PIP/TAZO <=4 SENSITIVE Sensitive     CEFEPIME 4 SENSITIVE Sensitive     * RARE PSEUDOMONAS AERUGINOSA  Acid Fast Smear (AFB)     Status: None   Collection Time: 01/16/17  5:18 PM  Result Value Ref Range Status   AFB Specimen Processing Concentration  Final   Acid Fast Smear Negative  Final    Comment: (NOTE) Performed At: [facility] [place], [place] [id number] Rush Farmer MD [id number]    Source (AFB) TISSUE  Corrected    Comment: LEFT LOWER LEG CORRECTED ON 01/14 AT 1932: PREVIOUSLY REPORTED AS WOUND LEFT LOWER LEG   Fungus Culture Result     Status: None   Collection Time: 01/16/17   5:18 PM  Result Value Ref Range Status   Result 1 Comment  Final    Comment: (NOTE) KOH/Calcofluor preparation:  no fungus observed. Performed At: [facility] [place], [place] [id number] Rush Farmer MD [id number]   Aerobic/Anaerobic Culture (surgical/deep wound)     Status: None (Preliminary result)   Collection Time: 01/16/17  5:26  PM  Result Value Ref Range Status   Specimen Description WOUND LEFT LEG  Final   Special Requests ANTERIOR SWAB  Final   Gram Stain   Final    RARE WBC PRESENT,BOTH PMN AND MONONUCLEAR RARE GRAM NEGATIVE RODS FEW GRAM POSITIVE COCCI    Culture   Final    RARE ENTEROBACTER CLOACAE RARE PSEUDOMONAS AERUGINOSA NO ANAEROBES ISOLATED; CULTURE IN PROGRESS FOR 5 DAYS    Report Status PENDING  Incomplete   Organism ID, Bacteria ENTEROBACTER CLOACAE  Final   Organism ID, Bacteria PSEUDOMONAS AERUGINOSA  Final      Susceptibility   Enterobacter cloacae - MIC*    CEFAZOLIN >=64 RESISTANT Resistant     CEFEPIME <=1 SENSITIVE Sensitive     CEFTAZIDIME <=1 SENSITIVE Sensitive     CEFTRIAXONE <=1 SENSITIVE Sensitive     CIPROFLOXACIN <=0.25 SENSITIVE Sensitive     GENTAMICIN <=1 SENSITIVE Sensitive     IMIPENEM <=0.25 SENSITIVE Sensitive     TRIMETH/SULFA <=20 SENSITIVE Sensitive     PIP/TAZO <=4 SENSITIVE Sensitive     * RARE ENTEROBACTER CLOACAE   Pseudomonas aeruginosa - MIC*    CEFTAZIDIME 2 SENSITIVE Sensitive     CIPROFLOXACIN <=0.25 SENSITIVE Sensitive     GENTAMICIN <=1 SENSITIVE Sensitive     IMIPENEM 1 SENSITIVE Sensitive     PIP/TAZO <=4 SENSITIVE Sensitive     CEFEPIME <=1 SENSITIVE Sensitive     * RARE PSEUDOMONAS AERUGINOSA    Impression/Plan:  1. Leg ulcers - biopsy noted and non-specific.  Vasculitis not noted on biopsy. Some granulomatous inflammation, some bacteria.  Could be pyoderma gangrenosum.    On pip-tazo for above cultures, though role of bacteria in these ulcers not likely resulting in direct  infection, and now debrided and necrotic tissue out.   I recommend continuing pip-tazo about 2 more days to assure clearance of the bacteria if there is any residual infection.  If she goes home, can use cipro for 2 days.  I would think dermatology referral appropriate for definitive diagnosis, treatment with immune modulators.    2.  Hepatitis C antibody - would increase suspicion of vasculitis.  Check RNA and see if active.  I can treat patient as an outpatient if active.    3.  Wound care - ulcers noted from picture by Dr. Berenice Primas.  Will need ongoing care.    Dr. Baxter Flattery available over the weekend if needed, I will follow up Monday otherwise.

## 2017-01-20 NOTE — Consult Note (Signed)
[facility] nurse follow up on NPWT dressing.  Placed in [place] yesterday after debridement and placement of Acell per plastic surgeon.  Communicated with Dr. Marla Roe this morning.  Orders updated.   [place], [place], [place]

## 2017-01-20 NOTE — Plan of Care (Signed)
  Progressing Health Behavior/Discharge Planning: Ability to manage health-related needs will improve 01/20/2017 0030 - Progressing by Fransisco Hertz, RN Clinical Measurements: Ability to maintain clinical measurements within normal limits will improve 01/20/2017 0030 - Progressing by Fransisco Hertz, RN Will remain free from infection 01/20/2017 0030 - Progressing by Fransisco Hertz, RN

## 2017-01-21 DIAGNOSIS — Z8659 Personal history of other mental and behavioral disorders: Secondary | ICD-10-CM

## 2017-01-21 DIAGNOSIS — L03116 Cellulitis of left lower limb: Principal | ICD-10-CM

## 2017-01-21 DIAGNOSIS — Z8742 Personal history of other diseases of the female genital tract: Secondary | ICD-10-CM

## 2017-01-21 LAB — CULTURE, BLOOD (ROUTINE X 2)
Culture: NO GROWTH
Culture: NO GROWTH
SPECIAL REQUESTS: ADEQUATE
SPECIAL REQUESTS: ADEQUATE

## 2017-01-21 LAB — GLUCOSE, CAPILLARY: GLUCOSE-CAPILLARY: 109 mg/dL — AB (ref 65–99)

## 2017-01-21 MED ORDER — CIPROFLOXACIN HCL 500 MG PO TABS: 500.0000 mg | ORAL_TABLET | Freq: Two times a day (BID) | ORAL | 0 refills | Status: AC

## 2017-01-21 MED ORDER — OXYCODONE-ACETAMINOPHEN 5-325 MG PO TABS: 1.0000 | ORAL_TABLET | Freq: Three times a day (TID) | ORAL | 0 refills | Status: DC | PRN

## 2017-01-21 MED ORDER — CIPROFLOXACIN HCL 500 MG PO TABS: 500.0000 mg | ORAL_TABLET | Freq: Two times a day (BID) | ORAL | 0 refills | Status: DC

## 2017-01-21 MED ORDER — METHOCARBAMOL 500 MG PO TABS: 500.0000 mg | ORAL_TABLET | Freq: Four times a day (QID) | ORAL | 0 refills | Status: DC | PRN

## 2017-01-21 NOTE — Progress Notes (Signed)
Family Medicine Teaching Service Daily Progress Note Intern Pager: [phone]  Patient name: [name] Medical record number: [id number] Date of birth: 09-22-1985 Age: 32 y.o. Gender: female  Primary Care Provider: Patient, No Pcp Per Consultants: Plastics Surgery, Orthopedics, wound care Code Status: DNR/DNI   Pt Overview and Major Events to Date:  1/14 admitted to fpts, I&D of LLE 1/17 ACELL placement LLE  Assessment and Plan: Temprance Wyre is a 32 y.o. female presenting with 6 week history of developing LLE ulcer.  Left Lower Extremity Ulcer Patient had I&D of LLE ulcers on 1/14. Cultures of wound showing gram positive cocci, gram negative rods, pseudomonas. Wound cultures with growth of pseudomonas and E.Coli. Possible growth of enterobacter cloacae. Blood cultures negative for 4 days. Taken to OR with PRS 1/17. ACELL placed with vac change. Appears that cultured bacteria may not be contributing to ulcers per ID note given debridement of necrotic tissue. PRS signed off, vac change next week in clinic.  Continue abx until 1/19, can switch to cipro if she leaves. Will need home health and financial assistance set up prior to leaving. If able to accomplish this can likely dc later on 1/19. Will need follow up with PRS for vac changes. Also will need follow up with dermatology for possible pyoderma gangrenosum diagnosis and management. - vital signs per floor routine - pulse ox with vitals - zosyn 1/19, can switch to cipro if dc - zosyn per pharmacy consult - lovenox for dvt ppx - regular diet - zofran for nausea - tylenol and ibuprofen for pain - dc with prs follow up and derm follow up  Possible Type II diabetes A1C 6.5. Glucoses 131. No indication for insulin at this time. Could consider adding sliding scale if needed.  Hepatitis C exposure Patient with Hep C antibody >12. Indicates exposure vs active infection. Will follow up reflex rna quant. Can follow up with ID an an  outpatient.  FEN/GI: regular diet PPx: lovenox  Disposition: home  Subjective:  Doing well this morning. Only complaint is boredom.   Objective: Temp:  [97.8 F (36.6 C)-99.1 F (37.3 C)] 99.1 F (37.3 C) (01/19 0851) Pulse Rate:  [69-89] 89 (01/19 0851) Resp:  [18] 18 (01/19 0449) BP: (107-135)/(52-64) 120/58 (01/19 0851) SpO2:  [94 %-98 %] 95 % (01/19 0851) Physical Exam: General: obese, caucasian female, resting comfortably in NAD Cardiovascular: rrr , no murmurs/rubs/gallops, palpable radial pulse.  Respiratory: lungs CTAB, no chest pain Gastrointestinal: soft, non-tender, non-distended MSK: 5/5 strength all muscle groups BLE, BUE Derm: warm and dry, LLE wrapped in ace wrap with kerlex poking out. Vac in place. Neuro: cn 2-12 intact, no focal neuro deficit Psych: appropriate   Laboratory: Recent Labs  Lab 01/18/17 0415 01/19/17 0619 01/20/17 0634  WBC 7.6 8.2 9.8  HGB 9.0* 9.4* 11.0*  HCT 29.0* 30.5* 35.0*  PLT 335 336 410*   Recent Labs  Lab 01/16/17 0152 01/17/17 0833 01/18/17 0415 01/20/17 0634  NA 136 139 138 137  K 3.7 4.1 3.6 4.3  CL 101 101 101 99*  CO2 22 26 25 25   BUN 7 5* 7 11  CREATININE 0.74 0.63 0.69 0.70  CALCIUM 9.2 9.2 8.9 9.5  PROT 7.9 7.3 6.3*  --   BILITOT 0.6 0.3 0.5  --   ALKPHOS 141* 110 93  --   ALT 39 29 24  --   AST 31 21 17   --   GLUCOSE 145* 131* 120* 142*    Imaging/Diagnostic Tests: CLINICAL  DATA:  LEFT lower extremity drainage with venous stasis ulcers.  EXAM: LEFT TIBIA AND FIBULA - 2 VIEW  COMPARISON:  None.  FINDINGS: There is no evidence of fracture or other focal bone lesions. Soft tissue swelling with multiple skin defects in the anterolateral distal tibiofibular soft tissues. No subcutaneous gas or radiopaque foreign bodies.  IMPRESSION: Distal tibia and fibular ulceration without subcutaneous gas, radiopaque foreign bodies or acute osseous process.  CLINICAL DATA:  LEFT lower extremity  drainage with venous stasis ulcers.  EXAM: LEFT TIBIA AND FIBULA - 2 VIEW  COMPARISON:  None.  FINDINGS: There is no evidence of fracture or other focal bone lesions. Soft tissue swelling with multiple skin defects in the anterolateral distal tibiofibular soft tissues. No subcutaneous gas or radiopaque foreign bodies.  IMPRESSION: Distal tibia and fibular ulceration without subcutaneous gas, radiopaque foreign bodies or acute osseous process.  Guadalupe Dawn, MD 01/21/2017, 9:47 AM PGY-1, [place] Intern pager: [phone], text pages welcome

## 2017-01-21 NOTE — Discharge Instructions (Signed)
You were admitted for an large wound in your left leg. You underwent debridement of this wound and had a wound vac placed over it. Your pathology is suspicious for a possible autoimmune disorder and we would like for dermatology to evaluate. You also had a high amount of hepatitis C antibodies. While this does not mean that you have the infection, it is suspicious for it. We have confirmatory tests pending.   You will discuss these issues with your PCP who you have an appointment with on 1/29. Please follow up with plastic surgery on 01/27/2017 for a wound vac change. Please finish your course of antibiotics by taking an additional three doses of ciprofloxacin.

## 2017-01-21 NOTE — Progress Notes (Signed)
Spoke w patient at bedside. She will dc to  [place] [address]   And be followed by [facility], she has [place] letter at bedside for Rx, f/u apt made, VAC for home use at bedside. Staff RN will attach prior to DC. Patient did not have any questions about DC plan. Jermaine w [facility] notified of DC today.

## 2017-01-22 ENCOUNTER — Other Ambulatory Visit: Payer: Self-pay | Admitting: Family Medicine

## 2017-01-22 DIAGNOSIS — L97929 Non-pressure chronic ulcer of unspecified part of left lower leg with unspecified severity: Principal | ICD-10-CM

## 2017-01-22 DIAGNOSIS — L97919 Non-pressure chronic ulcer of unspecified part of right lower leg with unspecified severity: Secondary | ICD-10-CM

## 2017-01-22 MED ORDER — OXYCODONE-ACETAMINOPHEN 5-325 MG PO TABS: 1.0000 | ORAL_TABLET | Freq: Three times a day (TID) | ORAL | 0 refills | Status: DC | PRN

## 2017-01-22 NOTE — Progress Notes (Unsigned)
Sent Rx for patient posthospital admission percocet 5/325 # 30 to [facility] [address] for leg ulers and post surgery pain.

## 2017-01-23 LAB — AEROBIC/ANAEROBIC CULTURE W GRAM STAIN (SURGICAL/DEEP WOUND)

## 2017-01-23 LAB — AEROBIC/ANAEROBIC CULTURE (SURGICAL/DEEP WOUND)

## 2017-01-24 LAB — AEROBIC/ANAEROBIC CULTURE W GRAM STAIN (SURGICAL/DEEP WOUND)

## 2017-01-24 LAB — AEROBIC/ANAEROBIC CULTURE (SURGICAL/DEEP WOUND)

## 2017-01-24 NOTE — Discharge Summary (Signed)
[facility] Discharge Summary  Patient name: [name] Medical record number: [id number] Date of birth: May 29, 1985 Age: 32 y.o. Gender: female Date of Admission: 01/16/2017  Date of Discharge: 01/21/2017 Admitting Physician: Zenia Resides, MD  Primary Care Provider: Patient, No Pcp Per Consultants: orthopedics, wound care, infectious disease, plastic surgery  Indication for Hospitalization: large, open left lower extremity wounds  Discharge Diagnoses/Problem List:  Left Lower Extremity Ulcers Possible Type II diabetes Hepatitis C exposure  Disposition: home  Discharge Condition: good  Discharge Exam: General:obese, caucasian female, resting comfortably in NAD Cardiovascular:rrr , no murmurs/rubs/gallops, palpable radial pulse.  Respiratory:lungs CTAB, no chest pain Gastrointestinal:soft, non-tender, non-distended MSK:5/5 strength all muscle groups BLE, BUE Derm:warm and dry, LLE wrapped in ace wrap with kerlex poking out. Vac in place. Neuro:cn 2-12 intact, no focal neuro deficit Psych:appropriate  Brief Hospital Course:  32 year old who presented on 01/16/2017 with large LLE ulcers. They had been getting progressively worse for 6 weeks prior to presentation. Patient underwent debridement of her ulcers on 01/17/2017 and had a wound vac placed. On 01/18/2017 infectious disease was consulted for further management of infectious source. Cultures grew out pseudomonas and gram negative rods but these were felt to be likely colonization given the time course and size of the ulcers. Pathology revealed inflammatory changes around granulation tissue and it was felt that the patient would likely benefit from a dermatology referral to evaluate for possible autoimmune cause of her ulcers such as pyoderma gangrenosum (etc.).  Regarding the patient's wound, on 1/17 Plastic surgery was consulted for I&D and to place and A-Cell if her wound had shown marked  improvement during a vac change. She was evaluated on 1/18 and was deemed ok for discharge for plastic standpoint. The remainder of her time in the hospital was spend getting vac supplies, financial assistance and follow up appointments scheduled. She was deemed stable for discharge on 1/19. She was sent with a home vac and was scheduled to follow up with plastics on 01/27/2017.  Issues for Follow Up:  1. Follow up with plastic surgery for wound vac change 2. Make referral to dermatology 3. Follow up Hep C RNA quant 4. Continue to monitor wounds  Significant Procedures: left lower extremity I&D with wound vac placement Left Lower extremity I&D with ACELL placement and wound vac placement  Significant Labs and Imaging:  Recent Labs  Lab 01/18/17 0415 01/19/17 0619 01/20/17 0634  WBC 7.6 8.2 9.8  HGB 9.0* 9.4* 11.0*  HCT 29.0* 30.5* 35.0*  PLT 335 336 410*   Recent Labs  Lab 01/17/17 0833 01/18/17 0415 01/20/17 0634  NA 139 138 137  K 4.1 3.6 4.3  CL 101 101 99*  CO2 26 25 25   GLUCOSE 131* 120* 142*  BUN 5* 7 11  CREATININE 0.63 0.69 0.70  CALCIUM 9.2 8.9 9.5  ALKPHOS 110 93  --   AST 21 17  --   ALT 29 24  --   ALBUMIN 3.3* 2.9*  --     Results/Tests Pending at Time of Discharge:  Discharge Medications:  Allergies as of 01/21/2017      Reactions   Tegretol [carbamazepine] Other (See Comments)   Causes aplastic anemia   Ambien [zolpidem Tartrate] Rash      Medication List    TAKE these medications   acetaminophen 325 MG tablet Commonly known as:  TYLENOL Take 650 mg by mouth every 6 (six) hours as needed for mild pain.  ibuprofen 200 MG tablet Commonly known as:  ADVIL,MOTRIN Take 200 mg by mouth every 6 (six) hours as needed for mild pain.   methocarbamol 500 MG tablet Commonly known as:  ROBAXIN Take 1 tablet (500 mg total) by mouth every 6 (six) hours as needed for muscle spasms.     ASK your doctor about these medications   ciprofloxacin 500 MG  tablet Commonly known as:  CIPRO Take 1 tablet (500 mg total) by mouth 2 (two) times daily for 2 days. Ask about: Should I take this medication?       Discharge Instructions: Please refer to Patient Instructions section of EMR for full details.  Patient was counseled important signs and symptoms that should prompt return to medical care, changes in medications, dietary instructions, activity restrictions, and follow up appointments.   Follow-Up Appointments: Follow-up Information    [facility] Follow up.   Specialty:  Family Medicine Why:  January 31, 2017 at 2:45 pm  Contact information: [place] [address] [phone]       Dorna Leitz, MD Follow up today.   Specialty:  Orthopedic Surgery Contact information: [place] [address] [phone]        Wallace Going, DO. Schedule an appointment as soon as possible for a visit on 01/27/2017.   Specialty:  Plastic Surgery Why:  Call the office to schedule an appointment to be seen next Friday, 01/27/17 in the office Contact information: [place] [address] [phone]           Guadalupe Dawn, MD 01/24/2017, 7:19 AM PGY-1, [place]

## 2017-01-31 ENCOUNTER — Inpatient Hospital Stay (INDEPENDENT_AMBULATORY_CARE_PROVIDER_SITE_OTHER): Payer: Self-pay | Admitting: Physician Assistant

## 2017-02-16 LAB — FUNGUS CULTURE WITH STAIN

## 2017-02-16 LAB — FUNGUS CULTURE RESULT

## 2017-02-16 LAB — FUNGAL ORGANISM REFLEX

## 2017-02-17 LAB — FUNGUS CULTURE WITH STAIN

## 2017-02-17 LAB — FUNGUS CULTURE RESULT

## 2017-02-17 LAB — FUNGAL ORGANISM REFLEX

## 2017-03-02 LAB — ACID FAST CULTURE WITH REFLEXED SENSITIVITIES (MYCOBACTERIA)

## 2017-03-02 LAB — ACID FAST CULTURE WITH REFLEXED SENSITIVITIES: ACID FAST CULTURE - AFSCU3: NEGATIVE

## 2017-05-22 DIAGNOSIS — L97822 Non-pressure chronic ulcer of other part of left lower leg with fat layer exposed: Secondary | ICD-10-CM | POA: Diagnosis not present

## 2017-05-22 DIAGNOSIS — F17210 Nicotine dependence, cigarettes, uncomplicated: Secondary | ICD-10-CM | POA: Diagnosis not present

## 2017-05-22 DIAGNOSIS — L88 Pyoderma gangrenosum: Secondary | ICD-10-CM | POA: Diagnosis not present

## 2017-05-26 DIAGNOSIS — F17210 Nicotine dependence, cigarettes, uncomplicated: Secondary | ICD-10-CM | POA: Diagnosis not present

## 2017-05-26 DIAGNOSIS — L97822 Non-pressure chronic ulcer of other part of left lower leg with fat layer exposed: Secondary | ICD-10-CM | POA: Diagnosis not present

## 2017-05-26 DIAGNOSIS — L88 Pyoderma gangrenosum: Secondary | ICD-10-CM | POA: Diagnosis not present

## 2017-05-30 DIAGNOSIS — L88 Pyoderma gangrenosum: Secondary | ICD-10-CM | POA: Diagnosis not present

## 2017-05-30 DIAGNOSIS — L97822 Non-pressure chronic ulcer of other part of left lower leg with fat layer exposed: Secondary | ICD-10-CM | POA: Diagnosis not present

## 2017-05-30 DIAGNOSIS — L080 Pyoderma: Secondary | ICD-10-CM | POA: Diagnosis not present

## 2017-06-02 DIAGNOSIS — L88 Pyoderma gangrenosum: Secondary | ICD-10-CM | POA: Diagnosis not present

## 2017-06-06 DIAGNOSIS — L0881 Pyoderma vegetans: Secondary | ICD-10-CM | POA: Diagnosis not present

## 2017-06-06 DIAGNOSIS — L88 Pyoderma gangrenosum: Secondary | ICD-10-CM | POA: Diagnosis not present

## 2017-06-06 DIAGNOSIS — M79605 Pain in left leg: Secondary | ICD-10-CM | POA: Diagnosis not present

## 2017-06-06 DIAGNOSIS — L97822 Non-pressure chronic ulcer of other part of left lower leg with fat layer exposed: Secondary | ICD-10-CM | POA: Diagnosis not present

## 2017-06-13 DIAGNOSIS — L88 Pyoderma gangrenosum: Secondary | ICD-10-CM | POA: Diagnosis not present

## 2017-06-13 DIAGNOSIS — L97822 Non-pressure chronic ulcer of other part of left lower leg with fat layer exposed: Secondary | ICD-10-CM | POA: Diagnosis not present

## 2017-06-16 DIAGNOSIS — L88 Pyoderma gangrenosum: Secondary | ICD-10-CM | POA: Diagnosis not present

## 2017-06-21 DIAGNOSIS — L97822 Non-pressure chronic ulcer of other part of left lower leg with fat layer exposed: Secondary | ICD-10-CM | POA: Diagnosis not present

## 2017-06-21 DIAGNOSIS — Z87891 Personal history of nicotine dependence: Secondary | ICD-10-CM | POA: Diagnosis not present

## 2017-06-21 DIAGNOSIS — L080 Pyoderma: Secondary | ICD-10-CM | POA: Diagnosis not present

## 2017-07-05 DIAGNOSIS — Z87891 Personal history of nicotine dependence: Secondary | ICD-10-CM | POA: Diagnosis not present

## 2017-07-05 DIAGNOSIS — L88 Pyoderma gangrenosum: Secondary | ICD-10-CM | POA: Diagnosis not present

## 2017-07-05 DIAGNOSIS — L97822 Non-pressure chronic ulcer of other part of left lower leg with fat layer exposed: Secondary | ICD-10-CM | POA: Diagnosis not present

## 2017-07-14 DIAGNOSIS — Z87891 Personal history of nicotine dependence: Secondary | ICD-10-CM | POA: Diagnosis not present

## 2017-07-14 DIAGNOSIS — L88 Pyoderma gangrenosum: Secondary | ICD-10-CM | POA: Diagnosis not present

## 2017-07-14 DIAGNOSIS — L97822 Non-pressure chronic ulcer of other part of left lower leg with fat layer exposed: Secondary | ICD-10-CM | POA: Diagnosis not present

## 2017-07-19 DIAGNOSIS — L97921 Non-pressure chronic ulcer of unspecified part of left lower leg limited to breakdown of skin: Secondary | ICD-10-CM | POA: Diagnosis not present

## 2017-07-19 DIAGNOSIS — D485 Neoplasm of uncertain behavior of skin: Secondary | ICD-10-CM | POA: Diagnosis not present

## 2017-07-19 DIAGNOSIS — L02416 Cutaneous abscess of left lower limb: Secondary | ICD-10-CM | POA: Diagnosis not present

## 2017-07-19 DIAGNOSIS — L299 Pruritus, unspecified: Secondary | ICD-10-CM | POA: Diagnosis not present

## 2017-07-19 DIAGNOSIS — I872 Venous insufficiency (chronic) (peripheral): Secondary | ICD-10-CM | POA: Diagnosis not present

## 2017-07-19 DIAGNOSIS — L0100 Impetigo, unspecified: Secondary | ICD-10-CM | POA: Diagnosis not present

## 2017-07-20 DIAGNOSIS — Z87891 Personal history of nicotine dependence: Secondary | ICD-10-CM | POA: Diagnosis not present

## 2017-07-20 DIAGNOSIS — L97822 Non-pressure chronic ulcer of other part of left lower leg with fat layer exposed: Secondary | ICD-10-CM | POA: Diagnosis not present

## 2017-07-20 DIAGNOSIS — L88 Pyoderma gangrenosum: Secondary | ICD-10-CM | POA: Diagnosis not present

## 2017-07-28 DIAGNOSIS — L97921 Non-pressure chronic ulcer of unspecified part of left lower leg limited to breakdown of skin: Secondary | ICD-10-CM | POA: Diagnosis not present

## 2017-07-28 DIAGNOSIS — L080 Pyoderma: Secondary | ICD-10-CM | POA: Diagnosis not present

## 2017-07-28 DIAGNOSIS — L0390 Cellulitis, unspecified: Secondary | ICD-10-CM | POA: Diagnosis not present

## 2017-07-28 DIAGNOSIS — S81802A Unspecified open wound, left lower leg, initial encounter: Secondary | ICD-10-CM | POA: Diagnosis not present

## 2017-07-28 DIAGNOSIS — L03116 Cellulitis of left lower limb: Secondary | ICD-10-CM | POA: Diagnosis not present

## 2017-08-03 DIAGNOSIS — L03116 Cellulitis of left lower limb: Secondary | ICD-10-CM | POA: Diagnosis not present

## 2017-08-03 DIAGNOSIS — L97822 Non-pressure chronic ulcer of other part of left lower leg with fat layer exposed: Secondary | ICD-10-CM | POA: Diagnosis not present

## 2017-08-03 DIAGNOSIS — L88 Pyoderma gangrenosum: Secondary | ICD-10-CM | POA: Diagnosis not present

## 2017-08-03 DIAGNOSIS — S81802A Unspecified open wound, left lower leg, initial encounter: Secondary | ICD-10-CM | POA: Diagnosis not present

## 2017-08-16 DIAGNOSIS — L97822 Non-pressure chronic ulcer of other part of left lower leg with fat layer exposed: Secondary | ICD-10-CM | POA: Diagnosis not present

## 2017-08-16 DIAGNOSIS — L88 Pyoderma gangrenosum: Secondary | ICD-10-CM | POA: Diagnosis not present

## 2017-08-16 DIAGNOSIS — Z87891 Personal history of nicotine dependence: Secondary | ICD-10-CM | POA: Diagnosis not present

## 2017-10-18 DIAGNOSIS — Z79899 Other long term (current) drug therapy: Secondary | ICD-10-CM | POA: Diagnosis not present

## 2018-01-24 DIAGNOSIS — R7303 Prediabetes: Secondary | ICD-10-CM | POA: Diagnosis not present

## 2018-01-24 DIAGNOSIS — L88 Pyoderma gangrenosum: Secondary | ICD-10-CM | POA: Diagnosis not present

## 2018-01-24 DIAGNOSIS — F17210 Nicotine dependence, cigarettes, uncomplicated: Secondary | ICD-10-CM | POA: Diagnosis not present

## 2018-01-24 DIAGNOSIS — L97822 Non-pressure chronic ulcer of other part of left lower leg with fat layer exposed: Secondary | ICD-10-CM | POA: Diagnosis not present

## 2018-01-31 DIAGNOSIS — L97822 Non-pressure chronic ulcer of other part of left lower leg with fat layer exposed: Secondary | ICD-10-CM | POA: Diagnosis not present

## 2018-01-31 DIAGNOSIS — L88 Pyoderma gangrenosum: Secondary | ICD-10-CM | POA: Diagnosis not present

## 2018-02-14 DIAGNOSIS — L88 Pyoderma gangrenosum: Secondary | ICD-10-CM | POA: Diagnosis not present

## 2018-02-14 DIAGNOSIS — L97822 Non-pressure chronic ulcer of other part of left lower leg with fat layer exposed: Secondary | ICD-10-CM | POA: Diagnosis not present

## 2018-03-02 DIAGNOSIS — L97822 Non-pressure chronic ulcer of other part of left lower leg with fat layer exposed: Secondary | ICD-10-CM | POA: Diagnosis not present

## 2018-03-02 DIAGNOSIS — L88 Pyoderma gangrenosum: Secondary | ICD-10-CM | POA: Diagnosis not present

## 2018-03-09 DIAGNOSIS — L97822 Non-pressure chronic ulcer of other part of left lower leg with fat layer exposed: Secondary | ICD-10-CM | POA: Diagnosis not present

## 2018-03-09 DIAGNOSIS — L88 Pyoderma gangrenosum: Secondary | ICD-10-CM | POA: Diagnosis not present

## 2018-03-16 DIAGNOSIS — L88 Pyoderma gangrenosum: Secondary | ICD-10-CM | POA: Diagnosis not present

## 2018-03-16 DIAGNOSIS — L97822 Non-pressure chronic ulcer of other part of left lower leg with fat layer exposed: Secondary | ICD-10-CM | POA: Diagnosis not present

## 2018-03-23 DIAGNOSIS — L88 Pyoderma gangrenosum: Secondary | ICD-10-CM | POA: Diagnosis not present

## 2018-03-23 DIAGNOSIS — L97822 Non-pressure chronic ulcer of other part of left lower leg with fat layer exposed: Secondary | ICD-10-CM | POA: Diagnosis not present

## 2018-03-30 DIAGNOSIS — L88 Pyoderma gangrenosum: Secondary | ICD-10-CM | POA: Diagnosis not present

## 2018-03-30 DIAGNOSIS — L97822 Non-pressure chronic ulcer of other part of left lower leg with fat layer exposed: Secondary | ICD-10-CM | POA: Diagnosis not present

## 2018-04-06 DIAGNOSIS — L97822 Non-pressure chronic ulcer of other part of left lower leg with fat layer exposed: Secondary | ICD-10-CM | POA: Diagnosis not present

## 2018-04-06 DIAGNOSIS — L88 Pyoderma gangrenosum: Secondary | ICD-10-CM | POA: Diagnosis not present

## 2018-04-10 DIAGNOSIS — Z5181 Encounter for therapeutic drug level monitoring: Secondary | ICD-10-CM | POA: Diagnosis not present

## 2018-04-10 DIAGNOSIS — L88 Pyoderma gangrenosum: Secondary | ICD-10-CM | POA: Diagnosis not present

## 2018-04-10 DIAGNOSIS — K529 Noninfective gastroenteritis and colitis, unspecified: Secondary | ICD-10-CM | POA: Diagnosis not present

## 2018-04-10 DIAGNOSIS — M0760 Enteropathic arthropathies, unspecified site: Secondary | ICD-10-CM | POA: Diagnosis not present

## 2018-04-10 DIAGNOSIS — K7581 Nonalcoholic steatohepatitis (NASH): Secondary | ICD-10-CM | POA: Diagnosis not present

## 2018-04-12 DIAGNOSIS — L88 Pyoderma gangrenosum: Secondary | ICD-10-CM | POA: Diagnosis not present

## 2018-04-12 DIAGNOSIS — L97822 Non-pressure chronic ulcer of other part of left lower leg with fat layer exposed: Secondary | ICD-10-CM | POA: Diagnosis not present

## 2018-04-16 DIAGNOSIS — K529 Noninfective gastroenteritis and colitis, unspecified: Secondary | ICD-10-CM | POA: Diagnosis not present

## 2018-04-17 DIAGNOSIS — Z5181 Encounter for therapeutic drug level monitoring: Secondary | ICD-10-CM | POA: Diagnosis not present

## 2018-04-17 DIAGNOSIS — B1920 Unspecified viral hepatitis C without hepatic coma: Secondary | ICD-10-CM | POA: Diagnosis not present

## 2018-04-17 DIAGNOSIS — L88 Pyoderma gangrenosum: Secondary | ICD-10-CM | POA: Diagnosis not present

## 2018-04-17 DIAGNOSIS — Z7952 Long term (current) use of systemic steroids: Secondary | ICD-10-CM | POA: Diagnosis not present

## 2018-05-01 DIAGNOSIS — B182 Chronic viral hepatitis C: Secondary | ICD-10-CM | POA: Diagnosis not present

## 2018-05-03 DIAGNOSIS — M064 Inflammatory polyarthropathy: Secondary | ICD-10-CM | POA: Diagnosis not present

## 2018-05-03 DIAGNOSIS — L88 Pyoderma gangrenosum: Secondary | ICD-10-CM | POA: Diagnosis not present

## 2018-05-03 DIAGNOSIS — L97822 Non-pressure chronic ulcer of other part of left lower leg with fat layer exposed: Secondary | ICD-10-CM | POA: Diagnosis not present

## 2018-05-03 DIAGNOSIS — B1920 Unspecified viral hepatitis C without hepatic coma: Secondary | ICD-10-CM | POA: Diagnosis not present

## 2018-05-03 DIAGNOSIS — Z87891 Personal history of nicotine dependence: Secondary | ICD-10-CM | POA: Diagnosis not present

## 2018-05-08 DIAGNOSIS — D649 Anemia, unspecified: Secondary | ICD-10-CM | POA: Diagnosis not present

## 2018-05-08 DIAGNOSIS — L88 Pyoderma gangrenosum: Secondary | ICD-10-CM | POA: Diagnosis not present

## 2018-05-08 DIAGNOSIS — R197 Diarrhea, unspecified: Secondary | ICD-10-CM | POA: Diagnosis not present

## 2018-05-08 DIAGNOSIS — M1380 Other specified arthritis, unspecified site: Secondary | ICD-10-CM | POA: Diagnosis not present

## 2018-05-08 DIAGNOSIS — B1920 Unspecified viral hepatitis C without hepatic coma: Secondary | ICD-10-CM | POA: Diagnosis not present

## 2018-05-09 DIAGNOSIS — L97822 Non-pressure chronic ulcer of other part of left lower leg with fat layer exposed: Secondary | ICD-10-CM | POA: Diagnosis not present

## 2018-05-09 DIAGNOSIS — L88 Pyoderma gangrenosum: Secondary | ICD-10-CM | POA: Diagnosis not present

## 2018-05-21 DIAGNOSIS — L0889 Other specified local infections of the skin and subcutaneous tissue: Secondary | ICD-10-CM | POA: Diagnosis not present

## 2018-05-21 DIAGNOSIS — L88 Pyoderma gangrenosum: Secondary | ICD-10-CM | POA: Diagnosis not present

## 2018-05-21 DIAGNOSIS — L089 Local infection of the skin and subcutaneous tissue, unspecified: Secondary | ICD-10-CM | POA: Diagnosis not present

## 2018-05-21 DIAGNOSIS — L97822 Non-pressure chronic ulcer of other part of left lower leg with fat layer exposed: Secondary | ICD-10-CM | POA: Diagnosis not present

## 2018-05-31 DIAGNOSIS — L97822 Non-pressure chronic ulcer of other part of left lower leg with fat layer exposed: Secondary | ICD-10-CM | POA: Diagnosis not present

## 2018-05-31 DIAGNOSIS — L88 Pyoderma gangrenosum: Secondary | ICD-10-CM | POA: Diagnosis not present

## 2018-06-07 DIAGNOSIS — L88 Pyoderma gangrenosum: Secondary | ICD-10-CM | POA: Diagnosis not present

## 2018-06-07 DIAGNOSIS — L0889 Other specified local infections of the skin and subcutaneous tissue: Secondary | ICD-10-CM | POA: Diagnosis not present

## 2018-06-07 DIAGNOSIS — L97822 Non-pressure chronic ulcer of other part of left lower leg with fat layer exposed: Secondary | ICD-10-CM | POA: Diagnosis not present

## 2018-06-14 DIAGNOSIS — L0889 Other specified local infections of the skin and subcutaneous tissue: Secondary | ICD-10-CM | POA: Diagnosis not present

## 2018-06-18 DIAGNOSIS — L0889 Other specified local infections of the skin and subcutaneous tissue: Secondary | ICD-10-CM | POA: Diagnosis not present

## 2018-06-21 DIAGNOSIS — L88 Pyoderma gangrenosum: Secondary | ICD-10-CM | POA: Diagnosis not present

## 2018-06-21 DIAGNOSIS — L97822 Non-pressure chronic ulcer of other part of left lower leg with fat layer exposed: Secondary | ICD-10-CM | POA: Diagnosis not present

## 2018-06-27 DIAGNOSIS — L88 Pyoderma gangrenosum: Secondary | ICD-10-CM | POA: Diagnosis not present

## 2018-06-27 DIAGNOSIS — B9689 Other specified bacterial agents as the cause of diseases classified elsewhere: Secondary | ICD-10-CM | POA: Diagnosis not present

## 2018-06-27 DIAGNOSIS — L97822 Non-pressure chronic ulcer of other part of left lower leg with fat layer exposed: Secondary | ICD-10-CM | POA: Diagnosis not present

## 2018-06-28 DIAGNOSIS — L88 Pyoderma gangrenosum: Secondary | ICD-10-CM | POA: Diagnosis not present

## 2018-06-28 DIAGNOSIS — L97822 Non-pressure chronic ulcer of other part of left lower leg with fat layer exposed: Secondary | ICD-10-CM | POA: Diagnosis not present

## 2018-07-04 DIAGNOSIS — L88 Pyoderma gangrenosum: Secondary | ICD-10-CM | POA: Diagnosis not present

## 2018-07-04 DIAGNOSIS — L97822 Non-pressure chronic ulcer of other part of left lower leg with fat layer exposed: Secondary | ICD-10-CM | POA: Diagnosis not present

## 2018-07-05 DIAGNOSIS — L88 Pyoderma gangrenosum: Secondary | ICD-10-CM | POA: Diagnosis not present

## 2018-07-05 DIAGNOSIS — L97825 Non-pressure chronic ulcer of other part of left lower leg with muscle involvement without evidence of necrosis: Secondary | ICD-10-CM | POA: Diagnosis not present

## 2018-07-10 DIAGNOSIS — L88 Pyoderma gangrenosum: Secondary | ICD-10-CM | POA: Diagnosis not present

## 2018-07-10 DIAGNOSIS — L97825 Non-pressure chronic ulcer of other part of left lower leg with muscle involvement without evidence of necrosis: Secondary | ICD-10-CM | POA: Diagnosis not present

## 2018-07-14 DIAGNOSIS — L97825 Non-pressure chronic ulcer of other part of left lower leg with muscle involvement without evidence of necrosis: Secondary | ICD-10-CM | POA: Diagnosis not present

## 2018-07-14 DIAGNOSIS — L88 Pyoderma gangrenosum: Secondary | ICD-10-CM | POA: Diagnosis not present

## 2018-07-17 DIAGNOSIS — L88 Pyoderma gangrenosum: Secondary | ICD-10-CM | POA: Diagnosis not present

## 2018-07-17 DIAGNOSIS — L97825 Non-pressure chronic ulcer of other part of left lower leg with muscle involvement without evidence of necrosis: Secondary | ICD-10-CM | POA: Diagnosis not present

## 2018-07-25 DIAGNOSIS — L88 Pyoderma gangrenosum: Secondary | ICD-10-CM | POA: Diagnosis not present

## 2018-07-25 DIAGNOSIS — L97815 Non-pressure chronic ulcer of other part of right lower leg with muscle involvement without evidence of necrosis: Secondary | ICD-10-CM | POA: Diagnosis not present

## 2018-07-28 DIAGNOSIS — L88 Pyoderma gangrenosum: Secondary | ICD-10-CM | POA: Diagnosis not present

## 2018-07-28 DIAGNOSIS — L97825 Non-pressure chronic ulcer of other part of left lower leg with muscle involvement without evidence of necrosis: Secondary | ICD-10-CM | POA: Diagnosis not present

## 2018-08-02 DIAGNOSIS — L97825 Non-pressure chronic ulcer of other part of left lower leg with muscle involvement without evidence of necrosis: Secondary | ICD-10-CM | POA: Diagnosis not present

## 2018-08-02 DIAGNOSIS — L88 Pyoderma gangrenosum: Secondary | ICD-10-CM | POA: Diagnosis not present

## 2018-08-09 DIAGNOSIS — L88 Pyoderma gangrenosum: Secondary | ICD-10-CM | POA: Diagnosis not present

## 2018-08-14 DIAGNOSIS — L88 Pyoderma gangrenosum: Secondary | ICD-10-CM | POA: Diagnosis not present

## 2018-08-14 DIAGNOSIS — A498 Other bacterial infections of unspecified site: Secondary | ICD-10-CM | POA: Diagnosis not present

## 2018-08-14 DIAGNOSIS — L03116 Cellulitis of left lower limb: Secondary | ICD-10-CM | POA: Diagnosis not present

## 2018-08-16 DIAGNOSIS — L88 Pyoderma gangrenosum: Secondary | ICD-10-CM | POA: Diagnosis not present

## 2018-08-16 DIAGNOSIS — L97825 Non-pressure chronic ulcer of other part of left lower leg with muscle involvement without evidence of necrosis: Secondary | ICD-10-CM | POA: Diagnosis not present

## 2018-08-23 DIAGNOSIS — L97825 Non-pressure chronic ulcer of other part of left lower leg with muscle involvement without evidence of necrosis: Secondary | ICD-10-CM | POA: Diagnosis not present

## 2018-08-23 DIAGNOSIS — L88 Pyoderma gangrenosum: Secondary | ICD-10-CM | POA: Diagnosis not present

## 2018-08-30 DIAGNOSIS — L97825 Non-pressure chronic ulcer of other part of left lower leg with muscle involvement without evidence of necrosis: Secondary | ICD-10-CM | POA: Diagnosis not present

## 2018-08-30 DIAGNOSIS — L88 Pyoderma gangrenosum: Secondary | ICD-10-CM | POA: Diagnosis not present

## 2018-09-03 DIAGNOSIS — L88 Pyoderma gangrenosum: Secondary | ICD-10-CM | POA: Diagnosis not present

## 2018-09-04 DIAGNOSIS — L089 Local infection of the skin and subcutaneous tissue, unspecified: Secondary | ICD-10-CM | POA: Diagnosis not present

## 2018-09-04 DIAGNOSIS — L88 Pyoderma gangrenosum: Secondary | ICD-10-CM | POA: Diagnosis not present

## 2018-09-04 DIAGNOSIS — B182 Chronic viral hepatitis C: Secondary | ICD-10-CM | POA: Diagnosis not present

## 2018-09-04 DIAGNOSIS — L97922 Non-pressure chronic ulcer of unspecified part of left lower leg with fat layer exposed: Secondary | ICD-10-CM | POA: Diagnosis not present

## 2018-09-04 DIAGNOSIS — A498 Other bacterial infections of unspecified site: Secondary | ICD-10-CM | POA: Diagnosis not present

## 2018-09-06 DIAGNOSIS — L97822 Non-pressure chronic ulcer of other part of left lower leg with fat layer exposed: Secondary | ICD-10-CM | POA: Diagnosis not present

## 2018-09-06 DIAGNOSIS — L88 Pyoderma gangrenosum: Secondary | ICD-10-CM | POA: Diagnosis not present

## 2018-09-11 DIAGNOSIS — Z5181 Encounter for therapeutic drug level monitoring: Secondary | ICD-10-CM | POA: Diagnosis not present

## 2018-09-11 DIAGNOSIS — Z79899 Other long term (current) drug therapy: Secondary | ICD-10-CM | POA: Diagnosis not present

## 2018-09-11 DIAGNOSIS — L88 Pyoderma gangrenosum: Secondary | ICD-10-CM | POA: Diagnosis not present

## 2018-09-13 DIAGNOSIS — L97825 Non-pressure chronic ulcer of other part of left lower leg with muscle involvement without evidence of necrosis: Secondary | ICD-10-CM | POA: Diagnosis not present

## 2018-09-13 DIAGNOSIS — L88 Pyoderma gangrenosum: Secondary | ICD-10-CM | POA: Diagnosis not present

## 2018-09-17 DIAGNOSIS — L88 Pyoderma gangrenosum: Secondary | ICD-10-CM | POA: Diagnosis not present

## 2018-09-24 DIAGNOSIS — Z872 Personal history of diseases of the skin and subcutaneous tissue: Secondary | ICD-10-CM | POA: Diagnosis not present

## 2018-09-24 DIAGNOSIS — L97822 Non-pressure chronic ulcer of other part of left lower leg with fat layer exposed: Secondary | ICD-10-CM | POA: Diagnosis not present

## 2018-09-24 DIAGNOSIS — L080 Pyoderma: Secondary | ICD-10-CM | POA: Diagnosis not present

## 2018-10-05 DIAGNOSIS — L97822 Non-pressure chronic ulcer of other part of left lower leg with fat layer exposed: Secondary | ICD-10-CM | POA: Diagnosis not present

## 2018-10-05 DIAGNOSIS — L88 Pyoderma gangrenosum: Secondary | ICD-10-CM | POA: Diagnosis not present

## 2018-10-24 DIAGNOSIS — L88 Pyoderma gangrenosum: Secondary | ICD-10-CM | POA: Diagnosis not present

## 2018-10-24 DIAGNOSIS — L97822 Non-pressure chronic ulcer of other part of left lower leg with fat layer exposed: Secondary | ICD-10-CM | POA: Diagnosis not present

## 2018-11-02 DIAGNOSIS — L88 Pyoderma gangrenosum: Secondary | ICD-10-CM | POA: Diagnosis not present

## 2018-11-02 DIAGNOSIS — L97822 Non-pressure chronic ulcer of other part of left lower leg with fat layer exposed: Secondary | ICD-10-CM | POA: Diagnosis not present

## 2018-11-09 DIAGNOSIS — L97822 Non-pressure chronic ulcer of other part of left lower leg with fat layer exposed: Secondary | ICD-10-CM | POA: Diagnosis not present

## 2018-11-09 DIAGNOSIS — L88 Pyoderma gangrenosum: Secondary | ICD-10-CM | POA: Diagnosis not present

## 2018-11-23 DIAGNOSIS — L88 Pyoderma gangrenosum: Secondary | ICD-10-CM | POA: Diagnosis not present

## 2018-11-23 DIAGNOSIS — L97822 Non-pressure chronic ulcer of other part of left lower leg with fat layer exposed: Secondary | ICD-10-CM | POA: Diagnosis not present

## 2018-12-05 DIAGNOSIS — L88 Pyoderma gangrenosum: Secondary | ICD-10-CM | POA: Diagnosis not present

## 2018-12-05 DIAGNOSIS — L97829 Non-pressure chronic ulcer of other part of left lower leg with unspecified severity: Secondary | ICD-10-CM | POA: Diagnosis not present

## 2018-12-08 IMAGING — DX DG TIBIA/FIBULA 2V*L*
3 series · 3 of 3 positions shown · non-contrast
Comparison: None.

CLINICAL DATA: LEFT lower extremity drainage with venous stasis
ulcers.

EXAM:
LEFT TIBIA AND FIBULA - 2 VIEW

[tibia ap]
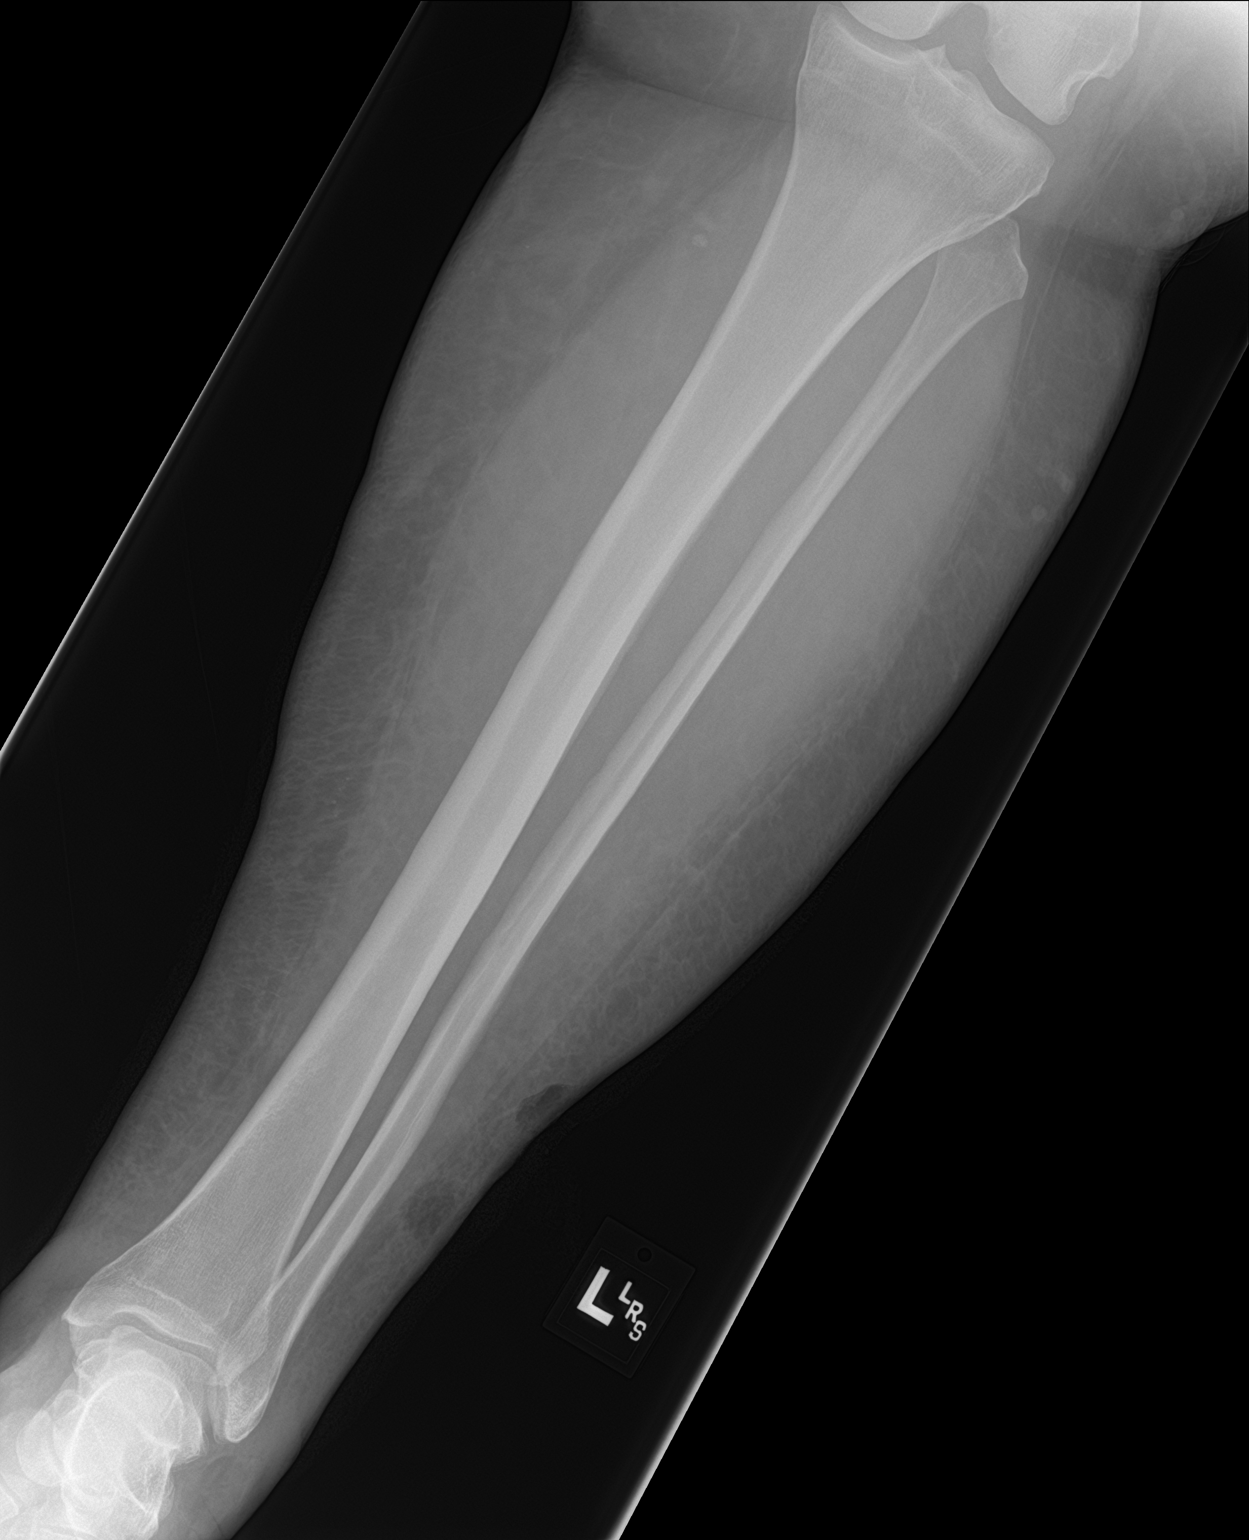

[tibia lat (1 of 2)]
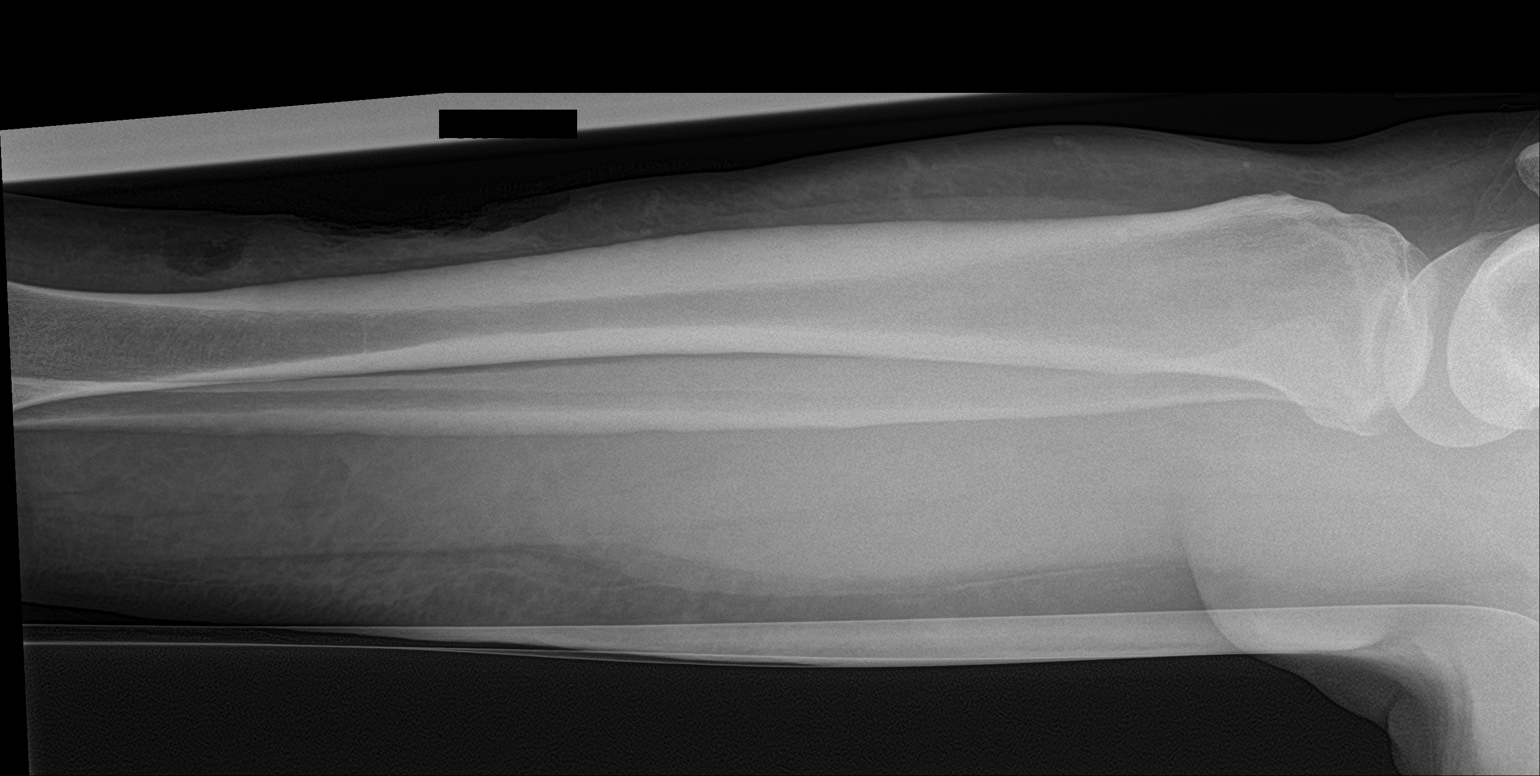

[tibia lat (2 of 2)]
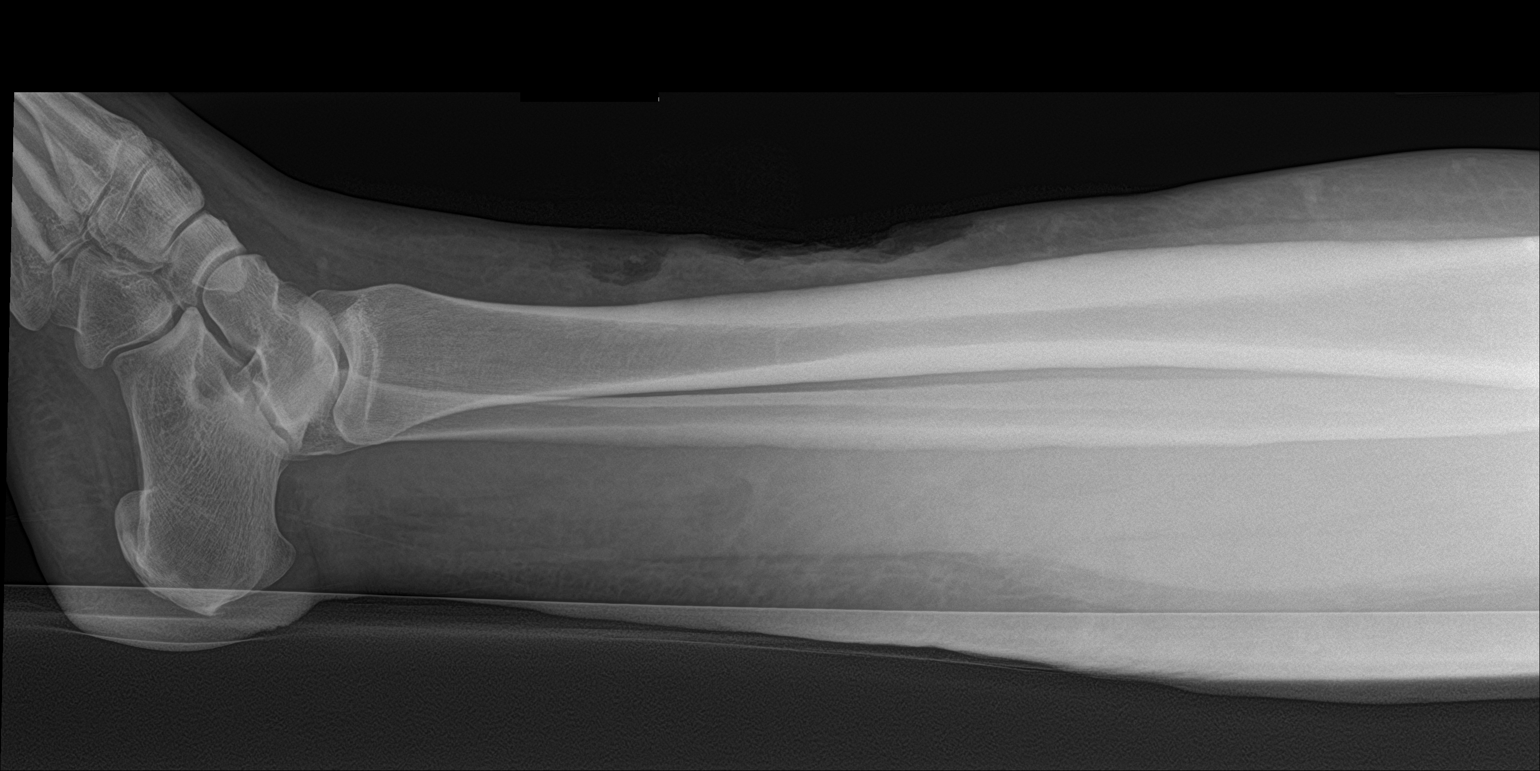

[3 of 3 positions shown; findings below may reference images not displayed]

FINDINGS: There is no evidence of fracture or other focal bone lesions. Soft
tissue swelling with multiple skin defects in the anterolateral
distal tibiofibular soft tissues. No subcutaneous gas or radiopaque
foreign bodies.
IMPRESSION: Distal tibia and fibular ulceration without subcutaneous gas,
radiopaque foreign bodies or acute osseous process.

## 2018-12-31 DIAGNOSIS — L97822 Non-pressure chronic ulcer of other part of left lower leg with fat layer exposed: Secondary | ICD-10-CM | POA: Diagnosis not present

## 2018-12-31 DIAGNOSIS — L88 Pyoderma gangrenosum: Secondary | ICD-10-CM | POA: Diagnosis not present

## 2019-01-07 DIAGNOSIS — L97822 Non-pressure chronic ulcer of other part of left lower leg with fat layer exposed: Secondary | ICD-10-CM | POA: Diagnosis not present

## 2019-01-07 DIAGNOSIS — L88 Pyoderma gangrenosum: Secondary | ICD-10-CM | POA: Diagnosis not present

## 2019-01-14 DIAGNOSIS — L97822 Non-pressure chronic ulcer of other part of left lower leg with fat layer exposed: Secondary | ICD-10-CM | POA: Diagnosis not present

## 2019-01-14 DIAGNOSIS — L88 Pyoderma gangrenosum: Secondary | ICD-10-CM | POA: Diagnosis not present

## 2019-01-21 DIAGNOSIS — L97822 Non-pressure chronic ulcer of other part of left lower leg with fat layer exposed: Secondary | ICD-10-CM | POA: Diagnosis not present

## 2019-01-21 DIAGNOSIS — L88 Pyoderma gangrenosum: Secondary | ICD-10-CM | POA: Diagnosis not present

## 2019-12-10 ENCOUNTER — Other Ambulatory Visit: Payer: Self-pay

## 2019-12-10 ENCOUNTER — Encounter (HOSPITAL_BASED_OUTPATIENT_CLINIC_OR_DEPARTMENT_OTHER): Payer: Medicaid Other | Attending: Internal Medicine | Admitting: Internal Medicine

## 2019-12-10 DIAGNOSIS — L97823 Non-pressure chronic ulcer of other part of left lower leg with necrosis of muscle: Secondary | ICD-10-CM | POA: Insufficient documentation

## 2019-12-10 DIAGNOSIS — L88 Pyoderma gangrenosum: Secondary | ICD-10-CM | POA: Diagnosis not present

## 2019-12-10 DIAGNOSIS — E11622 Type 2 diabetes mellitus with other skin ulcer: Secondary | ICD-10-CM | POA: Diagnosis not present

## 2019-12-10 NOTE — Progress Notes (Signed)
Kaitlyn, Reyes ([id number]) Visit Report for 12/10/2019 Allergy List Details Patient Name: Date of Service: [name], [name] [facility] RET 12/10/2019 10:30 A M Medical Record Number: [id number] Patient Account Number: [id number] Date of Birth/Sex: Treating RN: 1985-08-29 (34 y.o. Elam Dutch Primary Care Zarek Relph: PA Haig Prophet, [place] Other Clinician: Referring Puneet Masoner: Treating Sheylin Scharnhorst/Extender: Cheree Ditto in Treatment: 0 Allergies Active Allergies Tegretol Reaction: aplastic anemia Ambien Reaction: rash Allergy Notes Electronic Signature(s) Signed: 12/10/2019 5:33:51 PM By: Baruch Gouty RN, BSN Entered By: Baruch Gouty on 12/10/2019 10:31:34 -------------------------------------------------------------------------------- Arrival Information Details Patient Name: Date of Service: [name] RET 12/10/2019 10:30 A M Medical Record Number: [id number] Patient Account Number: [id number] Date of Birth/Sex: Treating RN: [birth year] (34 y.o. Elam Dutch Primary Care Nahome Bublitz: PA Haig [place], [place] Other Clinician: Referring Derin Granquist: Treating Jalisha Enneking/Extender: Cheree Ditto in Treatment: 0 Visit Information Patient Arrived: Ambulatory Arrival Time: 10:27 Accompanied By: self Transfer Assistance: None Patient Identification Verified: Yes Secondary Verification Process Completed: Yes Patient Requires Transmission-Based Precautions: No Patient Has Alerts: No Electronic Signature(s) Signed: 12/10/2019 5:33:51 PM By: Baruch Gouty RN, BSN Entered By: Baruch Gouty on 12/10/2019 10:28:07 -------------------------------------------------------------------------------- Clinic Level of Care Assessment Details Patient Name: Date of Service: [name], Kaitlyn RET 12/10/2019 10:30 A M Medical Record Number: [id number] Patient Account Number: [id number] Date of Birth/Sex: Treating RN: [birth year] (34 y.o. Orvan Falconer Primary Care Blaike Vickers: PA Haig [place], [place] Other  Clinician: Referring Seve Monette: Treating Trinten Boudoin/Extender: Cheree Ditto in Treatment: 0 Clinic Level of Care Assessment Items TOOL 1 Quantity Score X- 1 0 Use when EandM and Procedure is performed on INITIAL visit ASSESSMENTS - Nursing Assessment / Reassessment X- 1 20 General Physical Exam (combine w/ comprehensive assessment (listed just below) when performed on new pt. evals) X- 1 25 Comprehensive Assessment (HX, ROS, Risk Assessments, Wounds Hx, etc.) ASSESSMENTS - Wound and Skin Assessment / Reassessment []  - 0 Dermatologic / Skin Assessment (not related to wound area) ASSESSMENTS - Ostomy and/or Continence Assessment and Care []  - 0 Incontinence Assessment and Management []  - 0 Ostomy Care Assessment and Management (repouching, etc.) PROCESS - Coordination of Care X - Simple Patient / Family Education for ongoing care 1 15 []  - 0 Complex (extensive) Patient / Family Education for ongoing care X- 1 10 Staff obtains Programmer, systems, Records, T Results / Process Orders est []  - 0 Staff telephones HHA, Nursing Homes / Clarify orders / etc []  - 0 Routine Transfer to another Facility (non-emergent condition) []  - 0 Routine Hospital Admission (non-emergent condition) X- 1 15 New Admissions / Biomedical engineer / Ordering NPWT Apligraf, etc. , []  - 0 Emergency Hospital Admission (emergent condition) PROCESS - Special Needs []  - 0 Pediatric / Minor Patient Management []  - 0 Isolation Patient Management []  - 0 Hearing / Language / Visual special needs []  - 0 Assessment of Community assistance (transportation, D/C planning, etc.) []  - 0 Additional assistance / Altered mentation []  - 0 Support Surface(s) Assessment (bed, cushion, seat, etc.) INTERVENTIONS - Miscellaneous []  - 0 External ear exam []  - 0 Patient Transfer (multiple staff / Civil Service fast streamer / Similar devices) []  - 0 Simple Staple / Suture removal (25 or less) []  - 0 Complex Staple / Suture removal  (26 or more) []  - 0 Hypo/Hyperglycemic Management (do not check if billed separately) X- 1 15 Ankle / Brachial Index (ABI) - do not check if billed separately Has the patient been seen at the hospital within the last three years: Yes Total Score: 100 Level Of Care:  New/Established - Level 3 Electronic Signature(s) Signed: 12/10/2019 5:28:36 PM By: Carlene Coria RN Signed: 12/10/2019 5:28:36 PM By: Carlene Coria RN Entered By: Carlene Coria on 12/10/2019 11:37:10 -------------------------------------------------------------------------------- Compression Therapy Details Patient Name: Date of Service: [name] RET 12/10/2019 10:30 A M Medical Record Number: [id number] Patient Account Number: [id number] Date of Birth/Sex: Treating RN: Aug 13, 1985 (34 y.o. Orvan Falconer Primary Care Dai Mcadams: PA Haig [place], [place] Other Clinician: Referring Tasharra Nodine: Treating Weda Baumgarner/Extender: Cheree Ditto in Treatment: 0 Compression Therapy Performed for Wound Assessment: Wound #1 Left,Circumferential Lower Leg Performed By: Clinician Carlene Coria, RN Compression Type: Rolena Infante Post Procedure Diagnosis Same as Pre-procedure Electronic Signature(s) Signed: 12/10/2019 5:28:36 PM By: Carlene Coria RN Entered By: Carlene Coria on 12/10/2019 11:36:45 -------------------------------------------------------------------------------- Encounter Discharge Information Details Patient Name: Date of Service: [name] RET 12/10/2019 10:30 A M Medical Record Number: [id number] Patient Account Number: [id number] Date of Birth/Sex: Treating RN: 1985-02-13 (34 y.o. Tonita Reyes, Kaitlyn Primary Care Tequilla Cousineau: PA Haig Prophet, [place] Other Clinician: Referring Reve Crocket: Treating Anabela Crayton/Extender: Cheree Ditto in Treatment: 0 Encounter Discharge Information Items Discharge Condition: Stable Ambulatory Status: Ambulatory Discharge Destination: Home Transportation: Private Auto Accompanied By: self Schedule  Follow-up Appointment: Yes Clinical Summary of Care: Patient Declined Electronic Signature(s) Signed: 12/10/2019 4:53:31 PM By: Rhae Hammock RN Entered By: Rhae Hammock on 12/10/2019 11:44:28 -------------------------------------------------------------------------------- Lower Extremity Assessment Details Patient Name: Date of Service: [name] RET 12/10/2019 10:30 A M Medical Record Number: [id number] Patient Account Number: [id number] Date of Birth/Sex: Treating RN: 06-23-1985 (34 y.o. Elam Dutch Primary Care Kamisha Ell: PA Haig [place], [place] Other Clinician: Referring Makaylia Hewett: Treating Payslee Bateson/Extender: Cheree Ditto in Treatment: 0 Edema Assessment Assessed: [Left: No] [Right: No] Edema: [Left: Ye] [Right: s] Calf Left: Right: Point of Measurement: From Medial Instep 47 cm Ankle Left: Right: Point of Measurement: From Medial Instep 29.5 cm Knee To Floor Left: Right: From Medial Instep 45 cm Vascular Assessment Pulses: Dorsalis Pedis Palpable: [Left:Yes] Doppler Audible: [Left:Yes] Posterior Tibial Palpable: [Left:Yes] Electronic Signature(s) Signed: 12/10/2019 5:33:51 PM By: Baruch Gouty RN, BSN Entered By: Baruch Gouty on 12/10/2019 10:47:12 -------------------------------------------------------------------------------- Multi Wound Chart Details Patient Name: Date of Service: [name] RET 12/10/2019 10:30 A M Medical Record Number: [id number] Patient Account Number: [id number] Date of Birth/Sex: Treating RN: June 18, 1985 (34 y.o. Orvan Falconer Primary Care Takahiro Godinho: PA Haig Prophet, [place] Other Clinician: Referring Yarieliz Wasser: Treating Laury Huizar/Extender: Cheree Ditto in Treatment: 0 Vital Signs Height(in): 69 Pulse(bpm): 60 Weight(lbs): 273 Blood Pressure(mmHg): 140/98 Body Mass Index(BMI): 40 Temperature(F): 98.1 Respiratory Rate(breaths/min): 20 Photos: [1:No Photos Left, Circumferential Lower Leg] [N/A:N/A N/A] Wound  Location: [1:Gradually Appeared] [N/A:N/A] Wounding Event: [1:Auto-immune] [N/A:N/A] Primary Etiology: [1:Gangrene of Digits/ Extremity, Non-] [N/A:N/A] Secondary Etiology: [1:Diabetic Anemia, Type II Diabetes] [N/A:N/A] Comorbid History: [1:11/22/2016] [N/A:N/A] Date Acquired: [1:0] [N/A:N/A] Weeks of Treatment: [1:Open] [N/A:N/A] Wound Status: [1:15x28x1] [N/A:N/A] Measurements L x W x D (cm) [1:329.867] [N/A:N/A] A (cm) : rea [1:329.867] [N/A:N/A] Volume (cm) : [1:Full Thickness With Exposed Support] [N/A:N/A] Classification: [1:Structures Large] [N/A:N/A] Exudate Amount: [1:Serous] [N/A:N/A] Exudate Type: [1:amber] [N/A:N/A] Exudate Color: [1:Distinct, outline attached] [N/A:N/A] Wound Margin: [1:Medium (34-66%)] [N/A:N/A] Granulation Amount: [1:Pink] [N/A:N/A] Granulation Quality: [1:Medium (34-66%)] [N/A:N/A] Necrotic Amount: [1:Fat Layer (Subcutaneous Tissue): Yes N/A] Exposed Structures: [1:Muscle: Yes Fascia: No Tendon: No Joint: No Bone: No None] [N/A:N/A] Epithelialization: [1:Compression Therapy] [N/A:N/A] Treatment Notes Wound #1 (Lower Leg) Wound Laterality: Left, Circumferential Cleanser Soap and Water Discharge Instruction: May shower and wash wound with dial antibacterial soap and water prior to dressing change.  Wound Cleanser Discharge Instruction: Cleanse the wound with wound cleanser prior to applying a clean dressing using gauze sponges, not tissue or cotton balls. Peri-Wound Care Topical Triamcinolone Discharge Instruction: Apply Triamcinolone as directed Primary Dressing KerraCel Ag Gelling Fiber Dressing, 4x5 in (silver alginate) Discharge Instruction: Apply silver alginate to wound bed as instructed Secondary Dressing Woven Gauze Sponge, Non-Sterile 4x4 in Discharge Instruction: Apply over primary dressing as directed. ABD Pad, 5x9 Discharge Instruction: Apply over primary dressing as directed. Secured With Compression Wrap Unnaboot w/Calamine,  4x10 (in/yd) Discharge Instruction: Apply Unnaboot as directed. Kerlix Roll 4.5x3.1 (in/yd) Discharge Instruction: Apply Kerlix and Coban compression as directed. Coban Self-Adherent Wrap 4x5 (in/yd) Discharge Instruction: Apply over Kerlix as directed. Compression Stockings Add-Ons Electronic Signature(s) Signed: 12/10/2019 5:06:48 PM By: Linton Ham MD Signed: 12/10/2019 5:28:36 PM By: Carlene Coria RN Entered By: Linton Ham on 12/10/2019 11:51:19 -------------------------------------------------------------------------------- Multi-Disciplinary Care Plan Details Patient Name: Date of Service: Bobby Rumpf, [place] RET 12/10/2019 10:30 A M Medical Record Number: [id number] Patient Account Number: [id number] Date of Birth/Sex: Treating RN: June 16, 1985 (34 y.o. Orvan Falconer Primary Care Whitni Pasquini: PA Haig [place], [place] Other Clinician: Referring Lazara Grieser: Treating Solimar Maiden/Extender: Cheree Ditto in Treatment: 0 Active Inactive Nutrition Nursing Diagnoses: Impaired glucose control: actual or potential Goals: Patient/caregiver verbalizes understanding of need to maintain therapeutic glucose control per primary care physician Date Initiated: 12/10/2019 Target Resolution Date: 01/10/2020 Goal Status: Active Interventions: Assess HgA1c results as ordered upon admission and as needed Assess patient nutrition upon admission and as needed per policy Provide education on elevated blood sugars and impact on wound healing Notes: Wound/Skin Impairment Nursing Diagnoses: Knowledge deficit related to ulceration/compromised skin integrity Goals: Patient/caregiver will verbalize understanding of skin care regimen Date Initiated: 12/10/2019 Target Resolution Date: 01/10/2020 Goal Status: Active Ulcer/skin breakdown will have a volume reduction of 30% by week 4 Date Initiated: 12/10/2019 Target Resolution Date: 01/10/2020 Goal Status: Active Interventions: Assess patient/caregiver ability to  obtain necessary supplies Assess patient/caregiver ability to perform ulcer/skin care regimen upon admission and as needed Assess ulceration(s) every visit Notes: Electronic Signature(s) Signed: 12/10/2019 5:28:36 PM By: Carlene Coria RN Entered By: Carlene Coria on 12/10/2019 11:36:23 -------------------------------------------------------------------------------- Pain Assessment Details Patient Name: Date of Service: BRITTNAY, PIGMAN [facility] RET 12/10/2019 10:30 A M Medical Record Number: [id number] Patient Account Number: [id number] Date of Birth/Sex: Treating RN: Jan 18, 1985 (34 y.o. Elam Dutch Primary Care Darin Arndt: PA Haig [place], [place] Other Clinician: Referring Finleigh Cheong: Treating Kortney Schoenfelder/Extender: Cheree Ditto in Treatment: 0 Active Problems Location of Pain Severity and Description of Pain Patient Has Paino Yes Site Locations Pain Location: Pain Location: Pain in Ulcers With Dressing Change: Yes Duration of the Pain. Constant / Intermittento Constant Rate the pain. Current Pain Level: 9 Worst Pain Level: 10 Least Pain Level: 5 Character of Pain Describe the Pain: Aching, Burning Pain Management and Medication Current Pain Management: Medication: Yes Is the Current Pain Management Adequate: Inadequate How does your wound impact your activities of daily livingo Sleep: Yes Bathing: No Appetite: No Relationship With Others: No Bladder Continence: No Emotions: Yes Bowel Continence: No Work: Yes Toileting: No Drive: No Dressing: No Hobbies: Astronomer) Signed: 12/10/2019 5:33:51 PM By: Baruch Gouty RN, BSN Entered By: Baruch Gouty on 12/10/2019 10:50:53 -------------------------------------------------------------------------------- Patient/Caregiver Education Details Patient Name: Date of Service: [name] RET 12/7/2021andnbsp10:30 A M Medical Record Number: [id number] Patient Account Number: [id number] Date of Birth/Gender: Treating  RN: 02-23-1985 (34 y.o. Orvan Falconer Primary Care Physician: PA Haig [place], [place] Other Clinician: Referring Physician:  Treating Physician/Extender: Cheree Ditto in Treatment: 0 Education Assessment Education Provided To: Patient Education Topics Provided Wound/Skin Impairment: Methods: Explain/Verbal Responses: State content correctly Electronic Signature(s) Signed: 12/10/2019 5:28:36 PM By: Carlene Coria RN Entered By: Carlene Coria on 12/10/2019 11:29:12 -------------------------------------------------------------------------------- Wound Assessment Details Patient Name: Date of Service: KAMRIN, SPATH [facility] RET 12/10/2019 10:30 A M Medical Record Number: [id number] Patient Account Number: [id number] Date of Birth/Sex: Treating RN: 1985/09/22 (34 y.o. Elam Dutch Primary Care Early Steel: PA Haig [place], [place] Other Clinician: Referring Shellyann Wandrey: Treating Lucielle Vokes/Extender: Cheree Ditto in Treatment: 0 Wound Status Wound Number: 1 Primary Etiology: Auto-immune Wound Location: Left, Circumferential Lower Leg Secondary Etiology: Gangrene of Digits/ Extremity, Non-Diabetic Wounding Event: Gradually Appeared Wound Status: Open Date Acquired: 11/22/2016 Comorbid History: Anemia, Type II Diabetes Weeks Of Treatment: 0 Clustered Wound: No Wound Measurements Length: (cm) 15 Width: (cm) 28 Depth: (cm) 1 Area: (cm) 329.867 Volume: (cm) 329.867 % Reduction in Area: % Reduction in Volume: Epithelialization: None Tunneling: No Undermining: No Wound Description Classification: Full Thickness With Exposed Support Structures Wound Margin: Distinct, outline attached Exudate Amount: Large Exudate Type: Serous Exudate Color: amber Foul Odor After Cleansing: No Slough/Fibrino Yes Wound Bed Granulation Amount: Medium (34-66%) Exposed Structure Granulation Quality: Pink Fascia Exposed: No Necrotic Amount: Medium (34-66%) Fat Layer (Subcutaneous Tissue) Exposed:  Yes Necrotic Quality: Adherent Slough Tendon Exposed: No Muscle Exposed: Yes Necrosis of Muscle: No Joint Exposed: No Bone Exposed: No Treatment Notes Wound #1 (Lower Leg) Wound Laterality: Left, Circumferential Cleanser Soap and Water Discharge Instruction: May shower and wash wound with dial antibacterial soap and water prior to dressing change. Wound Cleanser Discharge Instruction: Cleanse the wound with wound cleanser prior to applying a clean dressing using gauze sponges, not tissue or cotton balls. Peri-Wound Care Topical Triamcinolone Discharge Instruction: Apply Triamcinolone as directed Primary Dressing KerraCel Ag Gelling Fiber Dressing, 4x5 in (silver alginate) Discharge Instruction: Apply silver alginate to wound bed as instructed Secondary Dressing Woven Gauze Sponge, Non-Sterile 4x4 in Discharge Instruction: Apply over primary dressing as directed. ABD Pad, 5x9 Discharge Instruction: Apply over primary dressing as directed. Secured With Compression Wrap Unnaboot w/Calamine, 4x10 (in/yd) Discharge Instruction: Apply Unnaboot as directed. Kerlix Roll 4.5x3.1 (in/yd) Discharge Instruction: Apply Kerlix and Coban compression as directed. Coban Self-Adherent Wrap 4x5 (in/yd) Discharge Instruction: Apply over Kerlix as directed. Compression Stockings Add-Ons Electronic Signature(s) Signed: 12/10/2019 5:33:51 PM By: Baruch Gouty RN, BSN Entered By: Baruch Gouty on 12/10/2019 10:49:46 -------------------------------------------------------------------------------- Vitals Details Patient Name: Date of Service: [place], [place] RET 12/10/2019 10:30 A M Medical Record Number: [id number] Patient Account Number: [id number] Date of Birth/Sex: Treating RN: 05-01-1985 (34 y.o. Elam Dutch Primary Care Enyah Moman: PA Haig [place], [place] Other Clinician: Referring Ottis Vacha: Treating Melvin Marmo/Extender: Cheree Ditto in Treatment: 0 Vital Signs Time Taken:  10:28 Temperature (F): 98.1 Height (in): 69 Pulse (bpm): 60 Source: Stated Respiratory Rate (breaths/min): 20 Weight (lbs): 273 Blood Pressure (mmHg): 140/98 Source: Stated Reference Range: 80 - 120 mg / dl Body Mass Index (BMI): 40.3 Electronic Signature(s) Signed: 12/10/2019 5:33:51 PM By: Baruch Gouty RN, BSN Entered By: Baruch Gouty on 12/10/2019 10:29:51

## 2019-12-10 NOTE — Progress Notes (Signed)
[name], [name] ([id number]) Visit Report for 12/10/2019 Abuse/Suicide Risk Screen Details Patient Name: Date of Service: Cederberg, [name] RGA RET 12/10/2019 10:30 A M Medical Record Number: [id number] Patient Account Number: [id number] Date of Birth/Sex: Treating RN: 03/09/1985 (34 y.o. F) Boehlein, Linda Primary Care Provider: PA TIENT, NO Other Clinician: Referring Provider: Treating Provider/Extender: Robson, Michael Weeks in Treatment: 0 Abuse/Suicide Risk Screen Items Answer ABUSE RISK SCREEN: Has anyone close to you tried to hurt or harm you recentlyo No Do you feel uncomfortable with anyone in your familyo No Has anyone forced you do things that you didnt want to doo No Electronic Signature(s) Signed: 12/10/2019 5:33:51 PM By: Boehlein, Linda RN, BSN Entered By: Boehlein, Linda on 12/10/2019 10:42:32 -------------------------------------------------------------------------------- Activities of Daily Living Details Patient Name: Date of Service: Mcever, [name] RGA RET 12/10/2019 10:30 A M Medical Record Number: [id number] Patient Account Number: [id number] Date of Birth/Sex: Treating RN: 01/28/1985 (34 y.o. F) Boehlein, Linda Primary Care Provider: PA TIENT, NO Other Clinician: Referring Provider: Treating Provider/Extender: Robson, Michael Weeks in Treatment: 0 Activities of Daily Living Items Answer Activities of Daily Living (Please select one for each item) Drive Automobile Completely Able T Medications ake Completely Able Use T elephone Completely Able Care for Appearance Completely Able Use T oilet Completely Able Bath / Shower Completely Able Dress Self Completely Able Feed Self Completely Able Walk Completely Able Get In / Out Bed Completely Able Housework Completely Able Prepare Meals Completely Able Handle Money Completely Able Shop for Self Completely Able Electronic Signature(s) Signed: 12/10/2019 5:33:51 PM By: Boehlein, Linda RN, BSN Entered By: Boehlein, Linda on  12/10/2019 10:43:09 -------------------------------------------------------------------------------- Education Screening Details Patient Name: Date of Service: Pollan, [name] RGA RET 12/10/2019 10:30 A M Medical Record Number: [id number] Patient Account Number: [id number] Date of Birth/Sex: Treating RN: 01/22/1985 (34 y.o. F) Boehlein, Linda Primary Care Provider: PA TIENT, NO Other Clinician: Referring Provider: Treating Provider/Extender: Robson, Michael Weeks in Treatment: 0 Primary Learner Assessed: Patient Learning Preferences/Education Level/Primary Language Learning Preference: Explanation, Demonstration, Printed Material Highest Education Level: College or Above Preferred Language: English Cognitive Barrier Language Barrier: No Translator Needed: No Memory Deficit: No Emotional Barrier: No Cultural/Religious Beliefs Affecting Medical Care: No Physical Barrier Impaired Vision: Yes Glasses Impaired Hearing: No Decreased Hand dexterity: No Knowledge/Comprehension Knowledge Level: High Comprehension Level: High Ability to understand written instructions: High Ability to understand verbal instructions: High Motivation Anxiety Level: Calm Cooperation: Cooperative Education Importance: Acknowledges Need Interest in Health Problems: Asks Questions Perception: Coherent Willingness to Engage in Self-Management High Activities: Readiness to Engage in Self-Management High Activities: Electronic Signature(s) Signed: 12/10/2019 5:33:51 PM By: Boehlein, Linda RN, BSN Entered By: Boehlein, Linda on 12/10/2019 10:43:52 -------------------------------------------------------------------------------- Fall Risk Assessment Details Patient Name: Date of Service: Belitz, [name] RGA RET 12/10/2019 10:30 A M Medical Record Number: [id number] Patient Account Number: [id number] Date of Birth/Sex: Treating RN: 09/03/1985 (34 y.o. F) Boehlein, Linda Primary Care Provider: PA TIENT, NO Other  Clinician: Referring Provider: Treating Provider/Extender: Robson, Michael Weeks in Treatment: 0 Fall Risk Assessment Items Have you had 2 or more falls in the last 12 monthso 0 No Have you had any fall that resulted in injury in the last 12 monthso 0 No FALLS RISK SCREEN History of falling - immediate or within 3 months 0 No Secondary diagnosis (Do you have 2 or more medical diagnoseso) 0 No Ambulatory aid None/bed rest/wheelchair/nurse 0 Yes Crutches/cane/walker 0 No Furniture 0 No Intravenous therapy Access/Saline/Heparin Lock 0 No Gait/Transferring Normal/ bed rest/ wheelchair 0 Yes Weak (short steps   with or without shuffle, stooped but able to lift head while walking, may seek 0 No support from furniture) Impaired (short steps with shuffle, may have difficulty arising from chair, head down, impaired 0 No balance) Mental Status Oriented to own ability 0 Yes Electronic Signature(s) Signed: 12/10/2019 5:33:51 PM By: Boehlein, Linda RN, BSN Entered By: Boehlein, Linda on 12/10/2019 10:44:13 -------------------------------------------------------------------------------- Foot Assessment Details Patient Name: Date of Service: Cotrell, [name] RGA RET 12/10/2019 10:30 A M Medical Record Number: [id number] Patient Account Number: [id number] Date of Birth/Sex: Treating RN: 01/24/1985 (34 y.o. F) Boehlein, Linda Primary Care Provider: PA TIENT, NO Other Clinician: Referring Provider: Treating Provider/Extender: Robson, Michael Weeks in Treatment: 0 Foot Assessment Items Site Locations + = Sensation present, - = Sensation absent, C = Callus, U = Ulcer R = Redness, W = Warmth, M = Maceration, PU = Pre-ulcerative lesion F = Fissure, S = Swelling, D = Dryness Assessment Right: Left: Other Deformity: No No Prior Foot Ulcer: No No Prior Amputation: No No Charcot Joint: No No Ambulatory Status: Ambulatory Without Help Gait: Steady Electronic Signature(s) Signed: 12/10/2019 5:33:51 PM  By: Boehlein, Linda RN, BSN Entered By: Boehlein, Linda on 12/10/2019 10:46:25 -------------------------------------------------------------------------------- Nutrition Risk Screening Details Patient Name: Date of Service: Monterroso, [name] RGA RET 12/10/2019 10:30 A M Medical Record Number: [id number] Patient Account Number: [id number] Date of Birth/Sex: Treating RN: 05/15/1985 (34 y.o. F) Boehlein, Linda Primary Care Provider: PA TIENT, NO Other Clinician: Referring Provider: Treating Provider/Extender: Robson, Michael Weeks in Treatment: 0 Height (in): 69 Weight (lbs): 273 Body Mass Index (BMI): 40.3 Nutrition Risk Screening Items Score Screening NUTRITION RISK SCREEN: I have an illness or condition that made me change the kind and/or amount of food I eat 0 No I eat fewer than two meals per day 0 No I eat few fruits and vegetables, or milk products 0 No I have three or more drinks of beer, liquor or wine almost every day 0 No I have tooth or mouth problems that make it hard for me to eat 0 No I don't always have enough money to buy the food I need 0 No I eat alone most of the time 0 No I take three or more different prescribed or over-the-counter drugs a day 1 Yes Without wanting to, I have lost or gained 10 pounds in the last six months 0 No I am not always physically able to shop, cook and/or feed myself 0 No Nutrition Protocols Good Risk Protocol 0 No interventions needed Moderate Risk Protocol High Risk Proctocol Risk Level: Good Risk Score: 1 Electronic Signature(s) Signed: 12/10/2019 5:33:51 PM By: Boehlein, Linda RN, BSN Entered By: Boehlein, Linda on 12/10/2019 10:45:59 

## 2019-12-12 NOTE — Progress Notes (Signed)
ELNOR, Reyes ([id number]) Visit Report for 12/10/2019 Chief Complaint Document Details Patient Name: Date of Service: [name], [name] [place] [facility] RET 12/10/2019 10:30 A M Medical Record Number: [id number] Patient Account Number: [id number] Date of Birth/Sex: Treating RN: 08-08-1985 (34 y.o. Female) Carlene Coria Primary Care Provider: PA Haig Prophet, [place] Other Clinician: Referring Provider: Treating Provider/Extender: Cheree Ditto in Treatment: 0 Information Obtained from: Patient Chief Complaint 12/10/2019; patient is here for continued follow-up of a substantial wound on her left lower leg apparently secondary to pyoderma gangrenosum Electronic Signature(s) Signed: 12/10/2019 5:06:48 PM By: Linton Ham MD Entered By: Linton Ham on 12/10/2019 11:53:34 -------------------------------------------------------------------------------- HPI Details Patient Name: Date of Service: [name] [name] RET 12/10/2019 10:30 A M Medical Record Number: [id number] Patient Account Number: [id number] Date of Birth/Sex: Treating RN: 08-26-1985 (34 y.o. Female) Carlene Coria Primary Care Provider: PA Haig Prophet, [place] Other Clinician: Referring Provider: Treating Provider/Extender: Cheree Ditto in Treatment: 0 History of Present Illness HPI Description: ADMISSION 12/10/2019 This is an unfortunate 34 year old woman who apparently has a past history of pyoderma gangrenosum based on skin biopsies that were done by dermatology. Unfortunately I do not have these biopsy results right in front of me however the evidence looking through [facility] health link seems almost here a few double that that is what she has. Her current wound has been in place for as long as 3 years. Apparently this started sometime in early 2019. This started as a small scratch which rapidly expanded. She was seen by Dr. Marla Roe in 2019 and taken to the OR for operative debridement and ACell placement. She was also seen in 2019 by Dr. Berenice Primas who  recommended a wound VAC. As far as I am aware none of these therapies really helped. She then came to the attention of Dr. Jerilee Hoh at the wound care clinic in [place]. He noted that debridement and compression was not really helping this patient so referred her to dermatology for biopsies that apparently showed pyoderma gangrenosum. She was followed in the spring 2020 by Dr. Barry Dienes at [facility] dermatology. His recommendation was for Humira which she actually took for several months. However at that time she was on Medicare because of disability and the cost of it was covered. Apparently the disability was actually mental health related. And when that ended Medicaid refused to cover the cost of Humira therefore she has not had this in a year and a half. The wound now is almost circumferential. She has been using wet-to-dry dressings with a loose Ace wrap. She has 2 young children at home. She cannot work because she cannot stand to be on the leg. She is a recently diagnosed diabetic but as I understand things was not a diabetic when this started. Past medical history includes recent diagnosis of diabetes, hepatitis C, [name], possible inflammatory bowel disease although I do not know whether that was actually proven, remote history of drug abuse although she has apparently been clean for over 5 years. ABI was not able to be done in her left leg because of pain but in 2009 an ABI on the left leg was 1.05 Electronic Signature(s) Signed: 12/10/2019 5:06:48 PM By: Linton Ham MD Entered By: Linton Ham on 12/10/2019 12:02:48 -------------------------------------------------------------------------------- Physical Exam Details Patient Name: Date of Service: [name] [name] RET 12/10/2019 10:30 A M Medical Record Number: [id number] Patient Account Number: [id number] Date of Birth/Sex: Treating RN: 1985/05/04 (34 y.o. Female) Carlene Coria Primary Care Provider: PA Haig Prophet, [place] Other  Clinician: Referring Provider: Treating Provider/Extender: Linton Ham  Weeks in Treatment: 0 Constitutional Patient is hypertensive.. Pulse regular and within target range for patient.Marland Kitchen Respirations regular, non-labored and within target range.. Temperature is normal and within the target range for the patient.Marland Kitchen Appears in no distress. Respiratory work of breathing is normal. Cardiovascular Popliteal and femoral pulses are palpable. Pedal pulses are palpable at the dorsalis pedis and posterior tibial. Integumentary (Hair, Skin) No erythema around the substantial wound I see no other skin lesions. Notes Wound exam; substantial open area on the left lower leg mostly anteriorly but extending on both sides almost circumferential. Anteriorly there is exposed muscle. There is no gross purulence. There is exposed muscle probably tibialis anterior but no exposed bone. Electronic Signature(s) Signed: 12/10/2019 5:06:48 PM By: Linton Ham MD Entered By: Linton Ham on 12/10/2019 12:08:43 -------------------------------------------------------------------------------- Physician Orders Details Patient Name: Date of Service: [name] [name] RET 12/10/2019 10:30 A M Medical Record Number: [id number] Patient Account Number: [id number] Date of Birth/Sex: Treating RN: 13-Sep-1985 (34 y.o. Female) Carlene Coria Primary Care Provider: PA Haig Prophet, [place] Other Clinician: Referring Provider: Treating Provider/Extender: Cheree Ditto in Treatment: 0 Verbal / Phone Orders: No Diagnosis Coding Follow-up Appointments Return Appointment in 1 week. Bathing/ Shower/ Hygiene May shower with protection but do not get wound dressing(s) wet. Edema Control - Lymphedema / SCD / Other Elevate legs to the level of the heart or above for 30 minutes daily and/or when sitting, a frequency of: Avoid standing for long periods of time. Exercise regularly Off-Loading Open toe surgical shoe to: - left Wound  Treatment Wound #1 - Lower Leg Wound Laterality: Left, Circumferential Cleanser: Soap and Water 1 x Per Week Discharge Instructions: May shower and wash wound with dial antibacterial soap and water prior to dressing change. Cleanser: Wound Cleanser 1 x Per Week Discharge Instructions: Cleanse the wound with wound cleanser prior to applying a clean dressing using gauze sponges, not tissue or cotton balls. Topical: Triamcinolone 1 x Per Week Discharge Instructions: Apply Triamcinolone as directed Prim Dressing: KerraCel Ag Gelling Fiber Dressing, 4x5 in (silver alginate) 1 x Per Week ary Discharge Instructions: Apply silver alginate to wound bed as instructed Secondary Dressing: Woven Gauze Sponge, Non-Sterile 4x4 in 1 x Per Week Discharge Instructions: Apply over primary dressing as directed. Secondary Dressing: ABD Pad, 5x9 1 x Per Week Discharge Instructions: Apply over primary dressing as directed. Compression Wrap: Unnaboot w/Calamine, 4x10 (in/yd) 1 x Per Week Discharge Instructions: Apply Unnaboot as directed. Compression Wrap: Kerlix Roll 4.5x3.1 (in/yd) 1 x Per Week Discharge Instructions: Apply Kerlix and Coban compression as directed. Compression Wrap: Coban Self-Adherent Wrap 4x5 (in/yd) 1 x Per Week Discharge Instructions: Apply over Kerlix as directed. Electronic Signature(s) Signed: 12/10/2019 5:06:48 PM By: Linton Ham MD Signed: 12/10/2019 5:28:36 PM By: Carlene Coria RN Entered By: Carlene Coria on 12/10/2019 11:36:13 -------------------------------------------------------------------------------- Problem List Details Patient Name: Date of Service: [name] [name] RET 12/10/2019 10:30 A M Medical Record Number: [id number] Patient Account Number: [id number] Date of Birth/Sex: Treating RN: 1985-08-22 (34 y.o. Female) Carlene Coria Primary Care Provider: PA Haig Prophet, [place] Other Clinician: Referring Provider: Treating Provider/Extender: Cheree Ditto in Treatment:  0 Active Problems ICD-10 Encounter Code Description Active Date MDM Diagnosis L88 Pyoderma gangrenosum 12/10/2019 No Yes L97.823 Non-pressure chronic ulcer of other part of left lower leg with necrosis of 12/10/2019 No Yes muscle E11.622 Type 2 diabetes mellitus with other skin ulcer 12/10/2019 No Yes Inactive Problems Resolved Problems Electronic Signature(s) Signed: 12/10/2019 5:06:48 PM By: Linton Ham MD Entered By: Linton Ham  on 12/10/2019 11:51:11 -------------------------------------------------------------------------------- Progress Note Details Patient Name: Date of Service: [place], [place] [facility] RET 12/10/2019 10:30 A M Medical Record Number: [id number] Patient Account Number: [id number] Date of Birth/Sex: Treating RN: 1985/03/06 (34 y.o. Female) Carlene Coria Primary Care Provider: PA Haig Prophet, [place] Other Clinician: Referring Provider: Treating Provider/Extender: Cheree Ditto in Treatment: 0 Subjective Chief Complaint Information obtained from Patient 12/10/2019; patient is here for continued follow-up of a substantial wound on her left lower leg apparently secondary to pyoderma gangrenosum History of Present Illness (HPI) ADMISSION 12/10/2019 This is an unfortunate 34 year old woman who apparently has a past history of pyoderma gangrenosum based on skin biopsies that were done by dermatology. Unfortunately I do not have these biopsy results right in front of me however the evidence looking through [facility] health link seems almost here a few double that that is what she has. Her current wound has been in place for as long as 3 years. Apparently this started sometime in early 2019. This started as a small scratch which rapidly expanded. She was seen by Dr. Marla Roe in 2019 and taken to the OR for operative debridement and ACell placement. She was also seen in 2019 by Dr. Berenice Primas who recommended a wound VAC. As far as I am aware none of these therapies really helped. She  then came to the attention of Dr. Jerilee Hoh at the wound care clinic in [place]. He noted that debridement and compression was not really helping this patient so referred her to dermatology for biopsies that apparently showed pyoderma gangrenosum. She was followed in the spring 2020 by Dr. Barry Dienes at [facility] dermatology. His recommendation was for Humira which she actually took for several months. However at that time she was on Medicare because of disability and the cost of it was covered. Apparently the disability was actually mental health related. And when that ended Medicaid refused to cover the cost of Humira therefore she has not had this in a year and a half. The wound now is almost circumferential. She has been using wet-to-dry dressings with a loose Ace wrap. She has 2 young children at home. She cannot work because she cannot stand to be on the leg. She is a recently diagnosed diabetic but as I understand things was not a diabetic when this started. Past medical history includes recent diagnosis of diabetes, hepatitis C, [name], possible inflammatory bowel disease although I do not know whether that was actually proven, remote history of drug abuse although she has apparently been clean for over 5 years. ABI was not able to be done in her left leg because of pain but in 2009 an ABI on the left leg was 1.05 Patient History Information obtained from Patient. Allergies Tegretol (Reaction: aplastic anemia), Ambien (Reaction: rash) Family History Diabetes - Mother,Maternal Grandparents, Heart Disease - Maternal Grandparents, Hypertension - Mother,Maternal Grandparents, Stroke - Mother,Maternal Grandparents, Thyroid Problems - Siblings, No family history of Cancer, Hereditary Spherocytosis, Kidney Disease, Lung Disease, Seizures, Tuberculosis. Social History Former smoker - quit 05/2019, Marital Status - Single, Alcohol Use - Rarely, Drug Use - Prior History - cocaine, heroin, Caffeine Use -  Daily. Medical History Eyes Denies history of Cataracts, Glaucoma, Optic Neuritis Hematologic/Lymphatic Patient has history of Anemia - microcytic Endocrine Patient has history of Type II Diabetes Denies history of Type I Diabetes Integumentary (Skin) Denies history of History of Burn Psychiatric Denies history of Anorexia/bulimia, Confinement Anxiety Patient is treated with Oral Agents. Hospitalization/Surgery History - IandD left leg ulcer. Medical A Surgical History  Notes nd Constitutional Symptoms (General Health) morbid obesity Immunological pyoderma granulosum Review of Systems (ROS) Constitutional Symptoms (General Health) Complains or has symptoms of Fatigue. Eyes Complains or has symptoms of Glasses / Contacts. Ear/Nose/Mouth/Throat Denies complaints or symptoms of Chronic sinus problems or rhinitis. Respiratory Denies complaints or symptoms of Chronic or frequent coughs, Shortness of Breath. Cardiovascular Denies complaints or symptoms of Chest pain. Gastrointestinal Complains or has symptoms of Frequent diarrhea, Nausea, Vomiting. Genitourinary Denies complaints or symptoms of Frequent urination. Integumentary (Skin) Complains or has symptoms of Wounds - left leg. Musculoskeletal Denies complaints or symptoms of Muscle Pain, Muscle Weakness. Neurologic Denies complaints or symptoms of Numbness/parasthesias. Psychiatric Denies complaints or symptoms of Claustrophobia, Suicidal. Objective Constitutional Patient is hypertensive.. Pulse regular and within target range for patient.Marland Kitchen Respirations regular, non-labored and within target range.. Temperature is normal and within the target range for the patient.Marland Kitchen Appears in no distress. Vitals Time Taken: 10:28 AM, Height: 69 in, Source: Stated, Weight: 273 lbs, Source: Stated, BMI: 40.3, Temperature: 98.1 F, Pulse: 60 bpm, Respiratory Rate: 20 breaths/min, Blood Pressure: 140/98 mmHg. Respiratory work of  breathing is normal. Cardiovascular Popliteal and femoral pulses are palpable. Pedal pulses are palpable at the dorsalis pedis and posterior tibial. General Notes: Wound exam; substantial open area on the left lower leg mostly anteriorly but extending on both sides almost circumferential. Anteriorly there is exposed muscle. There is no gross purulence. There is exposed muscle probably tibialis anterior but no exposed bone. Integumentary (Hair, Skin) No erythema around the substantial wound I see no other skin lesions. Wound #1 status is Open. Original cause of wound was Gradually Appeared. The wound is located on the Left,Circumferential Lower Leg. The wound measures 15cm length x 28cm width x 1cm depth; 329.867cm^2 area and 329.867cm^3 volume. There is muscle and Fat Layer (Subcutaneous Tissue) exposed. There is no tunneling or undermining noted. There is a large amount of serous drainage noted. The wound margin is distinct with the outline attached to the wound base. There is medium (34-66%) pink granulation within the wound bed. There is a medium (34-66%) amount of necrotic tissue within the wound bed including Adherent Slough. 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 There was a Haematologist Compression Therapy Procedure by Carlene Coria, RN. Post procedure Diagnosis Wound #1: Same as Pre-Procedure Plan Follow-up Appointments: Return Appointment in 1 week. Bathing/ Shower/ Hygiene: May shower with protection but do not get wound dressing(s) wet. Edema Control - Lymphedema / SCD / Other: Elevate legs to the level of the heart or above for 30 minutes daily and/or when sitting, a frequency of: Avoid standing for long periods of  time. Exercise regularly Off-Loading: Open toe surgical shoe to: - left WOUND #1: - Lower Leg Wound Laterality: Left, Circumferential Cleanser: Soap and Water 1 x Per Week/ Discharge Instructions: May shower and wash wound with dial antibacterial soap and water prior to dressing change. Cleanser: Wound Cleanser 1 x Per Week/ Discharge Instructions: Cleanse the wound with wound cleanser prior to applying a clean dressing using gauze sponges, not tissue or cotton balls. Topical: Triamcinolone 1 x Per Week/ Discharge Instructions: Apply Triamcinolone as directed Prim Dressing: KerraCel Ag Gelling Fiber Dressing, 4x5 in (silver alginate) 1 x Per Week/ ary Discharge Instructions: Apply silver alginate to  wound bed as instructed Secondary Dressing: Woven Gauze Sponge, Non-Sterile 4x4 in 1 x Per Week/ Discharge Instructions: Apply over primary dressing as directed. Secondary Dressing: ABD Pad, 5x9 1 x Per Week/ Discharge Instructions: Apply over primary dressing as directed. Com pression Wrap: Unnaboot w/Calamine, 4x10 (in/yd) 1 x Per Week/ Discharge Instructions: Apply Unnaboot as directed. Com pression Wrap: Kerlix Roll 4.5x3.1 (in/yd) 1 x Per Week/ Discharge Instructions: Apply Kerlix and Coban compression as directed. Com pression Wrap: Coban Self-Adherent Wrap 4x5 (in/yd) 1 x Per Week/ Discharge Instructions: Apply over Kerlix as directed. 1. I am going to attempt to get records from [facility] wound care clinic especially the biopsies. 2. Liberal TCA to the wound silver alginate ABDs under Unna compression 3. The patient at one point was given systemic steroids by dermatology with her diabetes I am reluctant to take this on now 4. I have seen off label use of humira make a huge difference for patients with this disease, I have told the patient that she needs to reapply for disability. I cannot imagine anybody working with the wound this size on her leg. 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 I cannot see this happening. These wounds are traditionally exceptionally painful 5. I will see if I can find anything new in the medical part of management of this disease. I have had success with smaller wounds using topical clobetasol but this wound is huge and she would have systemic absorption of this, especially under compression. For now I am content with TCA which is less potent. 6. I did not see any evidence of infection. I have not sure that anti-inflammatory antibiotics like doxycycline or macrolides have any role here but again I will try to research this I spent 45 minutes in review of this patient's past medical records, face-to-face evaluation and preparation of this record Electronic Signature(s) Signed: 12/10/2019 5:06:48 PM By: Linton Ham MD Entered By: Linton Ham on 12/10/2019 12:15:06 -------------------------------------------------------------------------------- HxROS Details Patient Name: Date of Service: [name] [name] RET 12/10/2019 10:30 A M Medical Record Number: [id number] Patient Account Number: [id number] Date of Birth/Sex: Treating RN: [birth year] (34 y.o. Female) [name] Primary Care Provider: PA Haig Prophet, [place] Other Clinician: Referring Provider: Treating Provider/Extender: Cheree Ditto in Treatment: 0 Information Obtained From Patient Constitutional Symptoms ([place]) Complaints and Symptoms: Positive for: Fatigue Medical History: Past Medical History Notes: morbid obesity Eyes Complaints and Symptoms: Positive for: Glasses / Contacts Medical History: Negative for: Cataracts; Glaucoma; Optic Neuritis Ear/Nose/Mouth/Throat Complaints and Symptoms: Negative for: Chronic sinus problems or rhinitis Respiratory Complaints and Symptoms: Negative for: Chronic or frequent coughs; Shortness of Breath Cardiovascular Complaints and Symptoms: Negative for: Chest  pain Gastrointestinal Complaints and Symptoms: Positive for: Frequent diarrhea; Nausea; Vomiting Genitourinary Complaints and Symptoms: Negative for: Frequent urination Integumentary (Skin) Complaints and Symptoms: Positive for: Wounds - left leg Medical History: Negative for: History of Burn Musculoskeletal Complaints and Symptoms: Negative for: Muscle Pain; Muscle Weakness Neurologic Complaints and Symptoms: Negative for: Numbness/parasthesias Psychiatric Complaints and Symptoms: Negative for: Claustrophobia; Suicidal Medical History: Negative for: Anorexia/bulimia; Confinement Anxiety Hematologic/Lymphatic Medical History: Positive for: Anemia - microcytic Endocrine Medical History: Positive for: Type II Diabetes Negative for: Type I Diabetes Time with diabetes: 2 months Treated with: Oral agents Immunological Medical History: Past Medical History Notes: pyoderma granulosum Oncologic Immunizations Pneumococcal Vaccine: Received Pneumococcal Vaccination: No Implantable Devices No devices added Hospitalization / Surgery History Type of Hospitalization/Surgery IandD left leg ulcer Family and Social  History Cancer: No; Diabetes: Yes - Mother,Maternal Grandparents; Heart Disease: Yes - Maternal Grandparents; Hereditary Spherocytosis: No; Hypertension: Yes - Mother,Maternal Grandparents; Kidney Disease: No; Lung Disease: No; Seizures: No; Stroke: Yes - Mother,Maternal Grandparents; Thyroid Problems: Yes - Siblings; Tuberculosis: No; Former smoker - quit 05/2019; Marital Status - Single; Alcohol Use: Rarely; Drug Use: Prior History - cocaine, heroin; Caffeine Use: Daily; Financial Concerns: No; Food, Clothing or Shelter Needs: No; Support System Lacking: No; Transportation Concerns: No Engineer, maintenance) Signed: 12/10/2019 5:06:48 PM By: Linton Ham MD Signed: 12/10/2019 5:33:51 PM By: Kaitlyn Gouty RN, BSN Entered By: [name] on 12/10/2019  10:42:11 -------------------------------------------------------------------------------- SuperBill Details Patient Name: Date of Service: [place], [place] RET 12/10/2019 Medical Record Number: [id number] Patient Account Number: [id number] Date of Birth/Sex: Treating RN: 15-Jun-1985 (34 y.o. 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 E11.622 Type 2 diabetes mellitus with other skin ulcer Facility Procedures CPT4 Code: [id number] Description: 99213 - WOUND CARE VISIT-LEV 3 EST PT Modifier: Quantity: 1 Physician Procedures : CPT4 Code Description Modifier [id number] - WC PHYS LEVEL 4 - NEW PT ICD-10 Diagnosis Description L88 Pyoderma gangrenosum L97.823 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

## 2019-12-13 ENCOUNTER — Encounter (HOSPITAL_BASED_OUTPATIENT_CLINIC_OR_DEPARTMENT_OTHER): Payer: Medicaid Other | Admitting: Internal Medicine

## 2019-12-13 ENCOUNTER — Other Ambulatory Visit: Payer: Self-pay

## 2019-12-13 DIAGNOSIS — E11622 Type 2 diabetes mellitus with other skin ulcer: Secondary | ICD-10-CM | POA: Diagnosis not present

## 2019-12-13 NOTE — Progress Notes (Signed)
KRYSTI, HICKLING ([id number]) Visit Report for 12/13/2019 Arrival Information Details Patient Name: Date of Service: [place], [place] [facility] RET 12/13/2019 10:45 A M Medical Record Number: [id number] Patient Account Number: [id number] Date of Birth/Sex: Treating RN: January 05, 1985 (34 y.o. Helene Shoe, Meta.Reding Primary Care Provider: PA Haig Prophet, [place] Other Clinician: Referring Provider: Treating Provider/Extender: Cheree Ditto in Treatment: 0 Visit Information History Since Last Visit Added or deleted any medications: No Patient Arrived: Ambulatory Any new allergies or adverse reactions: No Arrival Time: 11:19 Had a fall or experienced change in No Accompanied By: self activities of daily living that may affect Transfer Assistance: None risk of falls: Patient Identification Verified: Yes Signs or symptoms of abuse/neglect since last visito No Secondary Verification Process Completed: Yes Hospitalized since last visit: No Patient Requires Transmission-Based Precautions: No Implantable device outside of the clinic excluding No Patient Has Alerts: No cellular tissue based products placed in the center since last visit: Has Dressing in Place as Prescribed: Yes Has Compression in Place as Prescribed: Yes Pain Present Now: Yes Electronic Signature(s) Signed: 12/13/2019 2:12:11 PM By: Deon Pilling Entered By: Deon Pilling on 12/13/2019 12:23:05 -------------------------------------------------------------------------------- Compression Therapy Details Patient Name: Date of Service: RONNIKA, [place]LETT RET 12/13/2019 10:45 A M Medical Record Number: [id number] Patient Account Number: [id number] Date of Birth/Sex: Treating RN: [birth year] (34 y.o. Debby Bud Primary Care Provider: PA Haig Prophet, [place] Other Clinician: Referring Provider: Treating Provider/Extender: Cheree Ditto in Treatment: 0 Compression Therapy Performed for Wound Assessment: Wound #1 Left,Circumferential Lower Leg Performed  By: Clinician Deon Pilling, RN Compression Type: Rolena Infante Electronic Signature(s) Signed: 12/13/2019 2:12:11 PM By: Deon Pilling Entered By: Deon Pilling on 12/13/2019 12:23:45 -------------------------------------------------------------------------------- Encounter Discharge Information Details Patient Name: Date of Service: [name] RET 12/13/2019 10:45 A M Medical Record Number: [id number] Patient Account Number: [id number] Date of Birth/Sex: Treating RN: Aug 12, 1985 (34 y.o. Debby Bud Primary Care Provider: PA Haig Prophet, [place] Other Clinician: Referring Provider: Treating Provider/Extender: Cheree Ditto in Treatment: 0 Encounter Discharge Information Items Discharge Condition: Stable Ambulatory Status: Ambulatory Discharge Destination: Home Transportation: Private Auto Accompanied By: self Schedule Follow-up Appointment: Yes Clinical Summary of Care: Electronic Signature(s) Signed: 12/13/2019 2:12:11 PM By: Deon Pilling Entered By: Deon Pilling on 12/13/2019 12:24:09 -------------------------------------------------------------------------------- Patient/Caregiver Education Details Patient Name: Date of Service: [name] RET 12/10/2021andnbsp10:45 A M Medical Record Number: [id number] Patient Account Number: [id number] Date of Birth/Gender: Treating RN: 10/30/1985 (34 y.o. Debby Bud Primary Care Physician: PA Haig Prophet, [place] Other Clinician: Referring Physician: Treating Physician/Extender: Cheree Ditto in Treatment: 0 Education Assessment Education Provided To: Patient Education Topics Provided Elevated Blood Sugar/ Impact on Healing: Handouts: Elevated Blood Sugars: How Do They Affect Wound Healing Methods: Explain/Verbal Responses: Reinforcements needed Electronic Signature(s) Signed: 12/13/2019 2:12:11 PM By: Deon Pilling Entered By: Deon Pilling on 12/13/2019  12:24:01 -------------------------------------------------------------------------------- Wound Assessment Details Patient Name: Date of Service: MARLEA, GAMBILL [facility] RET 12/13/2019 10:45 A M Medical Record Number: [id number] Patient Account Number: [id number] Date of Birth/Sex: Treating RN: 1985-07-27 (34 y.o. Debby Bud Primary Care Provider: PA TIENT, [place] Other Clinician: Referring Provider: Treating Provider/Extender: Cheree Ditto in Treatment: 0 Wound Status Wound Number: 1 Primary Etiology: Auto-immune Wound Location: Left, Circumferential Lower Leg Secondary Etiology: Gangrene of Digits/ Extremity, Non-Diabetic Wounding Event: Gradually Appeared Wound Status: Open Date Acquired: 11/22/2016 Weeks Of Treatment: 0 Clustered Wound: No Wound Measurements Length: (cm) 15 Width: (cm) 28 Depth: (cm) 1 Area: (cm) 329.867 Volume: (cm) 329.867 % Reduction in Area: 0% % Reduction  in Volume: 0% Wound Description Classification: Full Thickness With Exposed Support Structures Treatment Notes Wound #1 (Lower Leg) Wound Laterality: Left, Circumferential Cleanser Soap and Water Discharge Instruction: May shower and wash wound with dial antibacterial soap and water prior to dressing change. Wound Cleanser Discharge Instruction: Cleanse the wound with wound cleanser prior to applying a clean dressing using gauze sponges, not tissue or cotton balls. Peri-Wound Care Topical Triamcinolone Discharge Instruction: Apply Triamcinolone as directed Primary Dressing KerraCel Ag Gelling Fiber Dressing, 4x5 in (silver alginate) Discharge Instruction: Apply silver alginate to wound bed as instructed Secondary Dressing Woven Gauze Sponge, Non-Sterile 4x4 in Discharge Instruction: Apply over primary dressing as directed. ABD Pad, 5x9 Discharge Instruction: Apply over primary dressing as directed. Secured With Compression Wrap Unnaboot w/Calamine, 4x10 (in/yd) Discharge  Instruction: Apply Unnaboot as directed. Kerlix Roll 4.5x3.1 (in/yd) Discharge Instruction: Apply Kerlix and Coban compression as directed. Coban Self-Adherent Wrap 4x5 (in/yd) Discharge Instruction: Apply over Kerlix as directed. Compression Stockings Add-Ons Electronic Signature(s) Signed: 12/13/2019 2:12:11 PM By: Deon Pilling Entered By: Deon Pilling on 12/13/2019 12:23:28 -------------------------------------------------------------------------------- Vitals Details Patient Name: Date of Service: [name] RET 12/13/2019 10:45 A M Medical Record Number: [id number] Patient Account Number: [id number] Date of Birth/Sex: Treating RN: 1985/09/11 (34 y.o. Debby Bud Primary Care Provider: PA TIENT, [place] Other Clinician: Referring Provider: Treating Provider/Extender: Cheree Ditto in Treatment: 0 Vital Signs Time Taken: 11:19 Temperature (F): 98.3 Height (in): 69 Pulse (bpm): 105 Weight (lbs): 273 Respiratory Rate (breaths/min): 20 Body Mass Index (BMI): 40.3 Blood Pressure (mmHg): 115/76 Reference Range: 80 - 120 mg / dl Electronic Signature(s) Signed: 12/13/2019 2:12:11 PM By: Deon Pilling Entered By: Deon Pilling on 12/13/2019 12:23:23

## 2019-12-17 ENCOUNTER — Encounter (HOSPITAL_BASED_OUTPATIENT_CLINIC_OR_DEPARTMENT_OTHER): Payer: Medicaid Other | Admitting: Internal Medicine

## 2019-12-17 ENCOUNTER — Other Ambulatory Visit: Payer: Self-pay

## 2019-12-18 NOTE — Progress Notes (Signed)
Kaitlyn, Reyes ([id number]) Visit Report for 12/17/2019 HPI Details Patient Name: Date of Service: [place], [place] [facility] RET 12/17/2019 10:30 A M Medical Record Number: [id number] Patient Account Number: [id number] Date of Birth/Sex: Treating RN: Feb 27, 1985 (34 y.o. Orvan Falconer Primary Care Provider: PA Haig Prophet, [place] Other Clinician: Referring Provider: Treating Provider/Extender: Cheree Ditto in Treatment: 1 History of Present Illness HPI Description: ADMISSION 12/10/2019 This is an unfortunate 34 year old woman who apparently has a past history of pyoderma gangrenosum based on skin biopsies that were done by dermatology. Unfortunately I do not have these biopsy results right in front of me however the evidence looking through [facility] health link seems almost here a few double that that is what she has. Her current wound has been in place for as long as 3 years. Apparently this started sometime in early 2019. This started as a small scratch which rapidly expanded. She was seen by Dr. Marla Roe in 2019 and taken to the OR for operative debridement and ACell placement. She was also seen in 2019 by Dr. Berenice Primas who recommended a wound VAC. As far as I am aware none of these therapies really helped. She then came to the attention of Dr. Jerilee Hoh at the wound care clinic in [place]. He noted that debridement and compression was not really helping this patient so referred her to dermatology for biopsies that apparently showed pyoderma gangrenosum. She was followed in the spring 2020 by Dr. Barry Dienes at [facility] dermatology. His recommendation was for Humira which she actually took for several months. However at that time she was on Medicare because of disability and the cost of it was covered. Apparently the disability was actually mental health related. And when that ended Medicaid refused to cover the cost of Humira therefore she has not had this in a year and a half. The wound now is almost  circumferential. She has been using wet-to-dry dressings with a loose Ace wrap. She has 2 young children at home. She cannot work because she cannot stand to be on the leg. She is a recently diagnosed diabetic but as I understand things was not a diabetic when this started. Past medical history includes recent diagnosis of diabetes, hepatitis C, Karlene Lineman, possible inflammatory bowel disease although I do not know whether that was actually proven, remote history of drug abuse although she has apparently been clean for over 5 years. ABI was not able to be done in her left leg because of pain but in 2009 an ABI on the left leg was 1.05 12/17/2019; patient I admitted to the clinic last week with a almost circumferential wound on the left anterior lower leg likely secondary to pyoderma gangrenosum. Used TCA and silver alginate. We brought her back in for a nurse visit on Friday but she says she took the wrap off last night because it was painful. In fact she spent the whole visit here talking about pain. I referred her back to her primary doctor who apparently put her on Lyrica for this. This obviously is not really completely effective for this gross deep wound with exposed muscle I also was not successful in getting access to the records and [place]. I had wanted to make sure I saw a biopsy result proving the diagnosis however apparently we have to go through the hospital to get these records for perhaps I suppose [place] dermatology She was doing well apparently with Dr. Barry Dienes dermatology at [facility] with Humira however she lost her insurance went on [place] and Medicaid would  not cover it. I told her that this I thought would qualify her for disability, I cannot imagine working with this especially a job where you are on your feet. In any case she is made an appointment for next week to look into this. If she was successful although we will take some time, this would make your Medicare  eligible. Electronic Signature(s) Signed: 12/17/2019 5:21:51 PM By: Linton Ham MD Entered By: Linton Ham on 12/17/2019 12:30:29 -------------------------------------------------------------------------------- Physical Exam Details Patient Name: Date of Service: [name] RET 12/17/2019 10:30 A M Medical Record Number: [id number] Patient Account Number: [id number] Date of Birth/Sex: Treating RN: 09-24-1985 (34 y.o. Orvan Falconer Primary Care Provider: PA Haig Prophet, [place] Other Clinician: Referring Provider: Treating Provider/Extender: Cheree Ditto in Treatment: 1 Constitutional Sitting or standing Blood Pressure is within target range for patient.. Slightly tachycardic regular. Respirations regular, non-labored and within target range.. Temperature is normal and within the target range for the patient.. . Patient looks very uncomfortable. Cardiovascular Needle pulses are palpable. Notes Wound exam; substantial almost circumferential wound on the left anterior mid tibia area. There is one rim of epithelialized tissue posteriorly otherwise this goes right around. Tibialis anterior muscle probably exposed. There is no gross purulence. Some areas of the wound appear necrotic especially medially but there is no obvious infection. Electronic Signature(s) Signed: 12/17/2019 5:21:51 PM By: Linton Ham MD Entered By: Linton Ham on 12/17/2019 12:33:43 -------------------------------------------------------------------------------- Physician Orders Details Patient Name: Date of Service: [name] RET 12/17/2019 10:30 A M Medical Record Number: [id number] Patient Account Number: [id number] Date of Birth/Sex: Treating RN: [birth year] (34 y.o. Benjaman Lobe Primary Care Provider: PA Haig Prophet, [place] Other Clinician: Referring Provider: Treating Provider/Extender: Cheree Ditto in Treatment: 1 Verbal / Phone Orders: No Diagnosis Coding Follow-up  Appointments Return Appointment in 1 week. Nurse Visit: - Friday Bathing/ Shower/ Hygiene May shower with protection but do not get wound dressing(s) wet. Edema Control - Lymphedema / SCD / Other Elevate legs to the level of the heart or above for 30 minutes daily and/or when sitting, a frequency of: Avoid standing for long periods of time. Exercise regularly Off-Loading Open toe surgical shoe to: - left Additional Orders / Instructions Other: - Follow up with PCP regarding pain medication. Make appt with Dr. Sharol Roussel to see about restarting Humira or other treatment. Request paperwork from Dr. Yancey Flemings office. Wound Treatment Wound #1 - Lower Leg Wound Laterality: Left, Circumferential Cleanser: Soap and Water 1 x Per Week Discharge Instructions: May shower and wash wound with dial antibacterial soap and water prior to dressing change. Cleanser: Wound Cleanser 1 x Per Week Discharge Instructions: Cleanse the wound with wound cleanser prior to applying a clean dressing using gauze sponges, not tissue or cotton balls. Topical: Triamcinolone 1 x Per Week Discharge Instructions: Apply Triamcinolone as directed Prim Dressing: KerraCel Ag Gelling Fiber Dressing, 4x5 in (silver alginate) ary 1 x Per Week Discharge Instructions: Apply TCA under Alginate ag. Secondary Dressing: ABD Pad, 5x9 1 x Per Week Discharge Instructions: Apply over primary dressing as directed. Secondary Dressing: Zetuvit Plus 4x8 in 1 x Per Week Discharge Instructions: Apply over primary dressing as directed. Compression Wrap: Unnaboot w/Calamine, 4x10 (in/yd) 1 x Per Week Discharge Instructions: Apply Unnaboot as directed. Electronic Signature(s) Signed: 12/17/2019 5:21:51 PM By: Linton Ham MD Signed: 12/18/2019 9:59:17 AM By: Rhae Hammock RN Previous Signature: 12/17/2019 11:23:31 AM Version By: Rhae Hammock RN Entered By: Rhae Hammock on 12/17/2019  11:36:50 -------------------------------------------------------------------------------- Problem List Details  Patient Name: Date of Service: [place], [place] [facility] RET 12/17/2019 10:30 A M Medical Record Number: [id number] Patient Account Number: [id number] Date of Birth/Sex: Treating RN: [birth year] (34 y.o. Orvan Falconer Primary Care Provider: PA Haig Prophet, [place] Other Clinician: Referring Provider: Treating Provider/Extender: Cheree Ditto in Treatment: 1 Active Problems ICD-10 Encounter Code Description Active Date MDM Diagnosis L88 Pyoderma gangrenosum 12/10/2019 No Yes L97.823 Non-pressure chronic ulcer of other part of left lower leg with necrosis of 12/10/2019 No Yes muscle E11.622 Type 2 diabetes mellitus with other skin ulcer 12/10/2019 No Yes Inactive Problems Resolved Problems Electronic Signature(s) Signed: 12/17/2019 5:21:51 PM By: Linton Ham MD Entered By: Linton Ham on 12/17/2019 12:25:29 -------------------------------------------------------------------------------- Progress Note Details Patient Name: Date of Service: [name] RET 12/17/2019 10:30 A M Medical Record Number: [id number] Patient Account Number: [id number] Date of Birth/Sex: Treating RN: 1985-07-05 (34 y.o. Orvan Falconer Primary Care Provider: PA Haig Prophet, [place] Other Clinician: Referring Provider: Treating Provider/Extender: Cheree Ditto in Treatment: 1 Subjective History of Present Illness (HPI) ADMISSION 12/10/2019 This is an unfortunate 34 year old woman who apparently has a past history of pyoderma gangrenosum based on skin biopsies that were done by dermatology. Unfortunately I do not have these biopsy results right in front of me however the evidence looking through [facility] health link seems almost here a few double that that is what she has. Her current wound has been in place for as long as 3 years. Apparently this started sometime in early 2019. This started as a  small scratch which rapidly expanded. She was seen by Dr. Marla Roe in 2019 and taken to the OR for operative debridement and ACell placement. She was also seen in 2019 by Dr. Berenice Primas who recommended a wound VAC. As far as I am aware none of these therapies really helped. She then came to the attention of Dr. Jerilee Hoh at the wound care clinic in [place]. He noted that debridement and compression was not really helping this patient so referred her to dermatology for biopsies that apparently showed pyoderma gangrenosum. She was followed in the spring 2020 by Dr. Barry Dienes at [facility] dermatology. His recommendation was for Humira which she actually took for several months. However at that time she was on Medicare because of disability and the cost of it was covered. Apparently the disability was actually mental health related. And when that ended Medicaid refused to cover the cost of Humira therefore she has not had this in a year and a half. The wound now is almost circumferential. She has been using wet-to-dry dressings with a loose Ace wrap. She has 2 young children at home. She cannot work because she cannot stand to be on the leg. She is a recently diagnosed diabetic but as I understand things was not a diabetic when this started. Past medical history includes recent diagnosis of diabetes, hepatitis C, Karlene Lineman, possible inflammatory bowel disease although I do not know whether that was actually proven, remote history of drug abuse although she has apparently been clean for over 5 years. ABI was not able to be done in her left leg because of pain but in 2009 an ABI on the left leg was 1.05 12/17/2019; patient I admitted to the clinic last week with a almost circumferential wound on the left anterior lower leg likely secondary to pyoderma gangrenosum. Used TCA and silver alginate. We brought her back in for a nurse visit on Friday but she says she took the wrap off last night because it was painful.  In  fact she spent the whole visit here talking about pain. I referred her back to her primary doctor who apparently put her on Lyrica for this. This obviously is not really completely effective for this gross deep wound with exposed muscle I also was not successful in getting access to the records and [place]. I had wanted to make sure I saw a biopsy result proving the diagnosis however apparently we have to go through the hospital to get these records for perhaps I suppose [place] dermatology She was doing well apparently with Dr. Barry Dienes dermatology at [facility] with Humira however she lost her insurance went on Medicaid and Medicaid would not cover it. I told her that this I thought would qualify her for disability, I cannot imagine working with this especially a job where you are on your feet. In any case she is made an appointment for next week to look into this. If she was successful although we will take some time, this would make your Medicare eligible. Objective Constitutional Sitting or standing Blood Pressure is within target range for patient.. Slightly tachycardic regular. Respirations regular, non-labored and within target range.. Temperature is normal and within the target range for the patient.. Patient looks very uncomfortable. Vitals Time Taken: 10:55 AM, Height: 69 in, Weight: 273 lbs, BMI: 40.3, Temperature: 98.8 F, Pulse: 118 bpm, Respiratory Rate: 20 breaths/min, Blood Pressure: 137/85 mmHg. Cardiovascular Needle pulses are palpable. General Notes: Wound exam; substantial almost circumferential wound on the left anterior mid tibia area. There is one rim of epithelialized tissue posteriorly otherwise this goes right around. Tibialis anterior muscle probably exposed. There is no gross purulence. Some areas of the wound appear necrotic especially medially but there is no obvious infection. Integumentary (Hair, Skin) Wound #1 status is Open. Original cause of wound was Gradually  Appeared. The wound is located on the Left,Circumferential Lower Leg. The wound measures 14cm length x 28.5cm width x 1cm depth; 313.374cm^2 area and 313.374cm^3 volume. There is muscle and Fat Layer (Subcutaneous Tissue) exposed. There is no tunneling or undermining noted. There is a large amount of serosanguineous drainage noted. The wound margin is distinct with the outline attached to the wound base. There is medium (34-66%) red, pink granulation within the wound bed. There is a medium (34-66%) amount of necrotic tissue within the wound bed including Adherent Slough. 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 Nurse Visit: - Friday Bathing/ Shower/ Hygiene: May shower with protection but do not get wound dressing(s) wet. Edema Control - Lymphedema / SCD / Other: Elevate legs to the level of the heart or above for 30 minutes daily and/or when sitting, a frequency of: Avoid standing for long periods of time. Exercise regularly Off-Loading: Open toe surgical shoe to: - left Additional Orders / Instructions: Other: - Follow up with PCP regarding pain medication. Make appt with Dr. Sharol Roussel to see about restarting Humira or other treatment. Request paperwork from Dr. Yancey Flemings office. WOUND #1: - Lower Leg Wound Laterality: Left, Circumferential Cleanser: Soap and Water 1 x Per Week/ Discharge Instructions: May shower and wash wound with dial antibacterial soap and water prior to dressing change. Cleanser: Wound Cleanser 1 x Per Week/ Discharge Instructions: Cleanse the wound with wound cleanser prior to applying a clean dressing using gauze sponges, not tissue or cotton balls. Topical: Triamcinolone 1  x Per Week/ Discharge Instructions: Apply Triamcinolone as directed Prim Dressing:  KerraCel Ag Gelling Fiber Dressing, 4x5 in (silver alginate) 1 x Per Week/ ary Discharge Instructions: Apply TCA under Alginate ag. Secondary Dressing: ABD Pad, 5x9 1 x Per Week/ Discharge Instructions: Apply over primary dressing as directed. Secondary Dressing: Zetuvit Plus 4x8 in 1 x Per Week/ Discharge Instructions: Apply over primary dressing as directed. Com pression Wrap: Unnaboot w/Calamine, 4x10 (in/yd) 1 x Per Week/ Discharge Instructions: Apply Unnaboot as directed. 1. The patient is very uncomfortable. 2. I referred her back to her primary doctor for pain management or referral to a pain clinic. I realized the patient has a remote history of substance abuse however this is something that would be severely painful. [Pyoderma gangrenosum] 3. I am going to refer her back to [facility] dermatology. I am uncertain about whether there is a compassionate access to Humira. Historically she seemed to do well with this by not just the patient's description but some of our nurses to work in the clinic at that time. They also note that the wound is a lot worse now 4. I am using TCA on this because of wound volume. I am not sure I can get enough clobetasol on this to make this cost effective. I am very reluctant to consider high-dose oral steroids but that has the option. The patient is a diabetic We will see her back next week bring her back at the end of the week for a nurse visit to change the dressing Electronic Signature(s) Signed: 12/17/2019 5:21:51 PM By: Linton Ham MD Entered By: Linton Ham on 12/17/2019 12:36:11 -------------------------------------------------------------------------------- SuperBill Details Patient Name: Date of Service: [name] RET 12/17/2019 Medical Record Number: [id number] Patient Account Number: [id number] Date of Birth/Sex: Treating RN: 12/25/1985 (34 y.o. 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

## 2019-12-19 NOTE — Progress Notes (Signed)
[name], [name] ([id number]) Visit Report for 12/13/2019 SuperBill Details Patient Name: Date of Service: [name], [place] [facility] RET 12/13/2019 Medical Record Number: [id number] Patient Account Number: [id number] Date of Birth/Sex: Treating RN: 03/07/1985 (34 y.o. 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 

## 2019-12-19 NOTE — Progress Notes (Signed)
CAITLIN, AINLEY ([id number]) Visit Report for 12/17/2019 Arrival Information Details Patient Name: Date of Service: [place], [place] [facility] RET 12/17/2019 10:30 A M Medical Record Number: [id number] Patient Account Number: [id number] Date of Birth/Sex: Treating RN: Feb 04, 1985 (34 y.o. Orvan Falconer Primary Care Neomia Herbel: PA Haig Prophet, [place] Other Clinician: Referring Bemnet Trovato: Treating Ilya Ess/Extender: Cheree Ditto in Treatment: 1 Visit Information History Since Last Visit Added or deleted any medications: No Patient Arrived: Ambulatory Any new allergies or adverse reactions: No Arrival Time: 10:52 Had a fall or experienced change in No Accompanied By: self activities of daily living that may affect Transfer Assistance: None risk of falls: Patient Identification Verified: Yes Signs or symptoms of abuse/neglect since last visito No Secondary Verification Process Completed: Yes Hospitalized since last visit: No Patient Requires Transmission-Based Precautions: No Implantable device outside of the clinic excluding No Patient Has Alerts: No cellular tissue based products placed in the center since last visit: Has Dressing in Place as Prescribed: Yes Pain Present Now: Yes Electronic Signature(s) Signed: 12/19/2019 7:52:14 AM By: Sandre Kitty Entered By: Sandre Kitty on 12/17/2019 10:55:16 -------------------------------------------------------------------------------- Encounter Discharge Information Details Patient Name: Date of Service: [name] RET 12/17/2019 10:30 A M Medical Record Number: [id number] Patient Account Number: [id number] Date of Birth/Sex: Treating RN: June 22, 1985 (34 y.o. Debby Bud Primary Care Osei Anger: PA Haig Prophet, [place] Other Clinician: Referring Golden Emile: Treating Marckus Hanover/Extender: Cheree Ditto in Treatment: 1 Encounter Discharge Information Items Discharge Condition: Stable Ambulatory Status: Ambulatory Discharge Destination:  Home Transportation: Private Auto Accompanied By: self Schedule Follow-up Appointment: Yes Clinical Summary of Care: Electronic Signature(s) Signed: 12/17/2019 4:43:44 PM By: Deon Pilling Entered By: Deon Pilling on 12/17/2019 12:17:10 -------------------------------------------------------------------------------- [place] Details Patient Name: Date of Service: [name] RET 12/17/2019 10:30 A M Medical Record Number: [id number] Patient Account Number: [id number] Date of Birth/Sex: Treating RN: October 19, 1985 (34 y.o. [name], [name] Primary Care Michelle Wnek: PA Haig Prophet, [place] Other Clinician: Referring Ezreal Turay: Treating Anjela Cassara/Extender: Cheree Ditto in Treatment: 1 Active Inactive Nutrition Nursing Diagnoses: Impaired glucose control: actual or potential Goals: Patient/caregiver verbalizes understanding of need to maintain therapeutic glucose control per primary care physician Date Initiated: 12/10/2019 Target Resolution Date: 01/10/2020 Goal Status: Active Interventions: Assess HgA1c results as ordered upon admission and as needed Assess patient nutrition upon admission and as needed per policy Provide education on elevated blood sugars and impact on wound healing Notes: Wound/Skin Impairment Nursing Diagnoses: Knowledge deficit related to ulceration/compromised skin integrity Goals: Patient/caregiver will verbalize understanding of skin care regimen Date Initiated: 12/10/2019 Target Resolution Date: 01/10/2020 Goal Status: Active Ulcer/skin breakdown will have a volume reduction of 30% by week 4 Date Initiated: 12/10/2019 Target Resolution Date: 01/10/2020 Goal Status: Active Interventions: Assess patient/caregiver ability to obtain necessary supplies Assess patient/caregiver ability to perform ulcer/skin care regimen upon admission and as needed Assess ulceration(s) every visit Notes: Electronic Signature(s) Signed: 12/17/2019 11:21:49 AM  By: Rhae Hammock RN Entered By: Rhae Hammock on 12/17/2019 11:21:49 -------------------------------------------------------------------------------- Pain Assessment Details Patient Name: Date of Service: [name] RET 12/17/2019 10:30 A M Medical Record Number: [id number] Patient Account Number: [id number] Date of Birth/Sex: Treating RN: January 03, 1986 (34 y.o. Orvan Falconer Primary Care Belmont Valli: PA Haig Prophet, [place] Other Clinician: Referring Donat Humble: Treating Audyn Dimercurio/Extender: Cheree Ditto in Treatment: 1 Active Problems Location of Pain Severity and Description of Pain Patient Has Paino Yes Site Locations Rate the pain. Current Pain Level: 10 Pain Management and Medication Current Pain Management: Electronic Signature(s) Signed: 12/17/2019 4:37:04 PM By: Carlene Coria RN  Signed: 12/19/2019 7:52:14 AM By: Sandre Kitty Entered By: Sandre Kitty on 12/17/2019 10:55:37 -------------------------------------------------------------------------------- Patient/Caregiver Education Details Patient Name: Date of Service: [name] RET 12/14/2021andnbsp10:30 A M Medical Record Number: [id number] Patient Account Number: [id number] Date of Birth/Gender: Treating RN: [birth year] (34 y.o. [name], [name] Primary Care Physician: PA Haig Prophet, [place] Other Clinician: Referring Physician: Treating Physician/Extender: Cheree Ditto in Treatment: 1 Education Assessment Education Provided To: Patient Education Topics Provided Elevated Blood Sugar/ Impact on Healing: Methods: Explain/Verbal Responses: State content correctly Electronic Signature(s) Signed: 12/18/2019 9:59:17 AM By: Rhae Hammock RN Entered By: Rhae Hammock on 12/17/2019 11:22:04 -------------------------------------------------------------------------------- Wound Assessment Details Patient Name: Date of Service: [name] RET 12/17/2019 10:30 A M Medical Record Number:  [id number] Patient Account Number: [id number] Date of Birth/Sex: Treating RN: [birth year] (34 y.o. Orvan Falconer Primary Care Bonna Steury: PA Haig Prophet, [place] Other Clinician: Referring Jael Waldorf: Treating Tilak Oakley/Extender: Cheree Ditto in Treatment: 1 Wound Status Wound Number: 1 Primary Etiology: Auto-immune Wound Location: Left, Circumferential Lower Leg Secondary Etiology: Gangrene of Digits/ Extremity, Non-Diabetic Wounding Event: Gradually Appeared Wound Status: Open Date Acquired: 11/22/2016 Comorbid History: Anemia, Type II Diabetes Weeks Of Treatment: 1 Clustered Wound: No Wound Measurements Length: (cm) 14 Width: (cm) 28.5 Depth: (cm) 1 Area: (cm) 313.374 Volume: (cm) 313.374 % Reduction in Area: 5% % Reduction in Volume: 5% Epithelialization: Small (1-33%) Tunneling: No Undermining: No Wound Description Classification: Full Thickness With Exposed Support Structures Wound Margin: Distinct, outline attached Exudate Amount: Large Exudate Type: Serosanguineous Exudate Color: red, brown Foul Odor After Cleansing: No Slough/Fibrino Yes Wound Bed Granulation Amount: Medium (34-66%) Exposed Structure Granulation Quality: Red, Pink Fascia Exposed: No Necrotic Amount: Medium (34-66%) Fat Layer (Subcutaneous Tissue) Exposed: Yes Necrotic Quality: Adherent Slough Tendon Exposed: No Muscle Exposed: Yes Necrosis of Muscle: No Joint Exposed: No Bone Exposed: No Treatment Notes Wound #1 (Lower Leg) Wound Laterality: Left, Circumferential Cleanser Soap and Water Discharge Instruction: May shower and wash wound with dial antibacterial soap and water prior to dressing change. Wound Cleanser Discharge Instruction: Cleanse the wound with wound cleanser prior to applying a clean dressing using gauze sponges, not tissue or cotton balls. Peri-Wound Care Topical Triamcinolone Discharge Instruction: Apply Triamcinolone as directed Primary Dressing KerraCel Ag Gelling  Fiber Dressing, 4x5 in (silver alginate) Discharge Instruction: Apply TCA under Alginate ag. Secondary Dressing ABD Pad, 5x9 Discharge Instruction: Apply over primary dressing as directed. Zetuvit Plus 4x8 in Discharge Instruction: Apply over primary dressing as directed. Secured With Compression Wrap Unnaboot w/Calamine, 4x10 (in/yd) Discharge Instruction: Apply Unnaboot as directed. Compression Stockings Add-Ons Electronic Signature(s) Signed: 12/17/2019 4:37:04 PM By: Carlene Coria RN Signed: 12/17/2019 5:24:10 PM By: Levan Hurst RN, BSN Entered By: Levan Hurst on 12/17/2019 11:18:39 -------------------------------------------------------------------------------- Vitals Details Patient Name: Date of Service: [place], [place] RET 12/17/2019 10:30 A M Medical Record Number: [id number] Patient Account Number: [id number] Date of Birth/Sex: Treating RN: [birth year] (34 y.o. Orvan Falconer Primary Care Tyrian Peart: PA Haig Prophet, [place] Other Clinician: Referring Pragya Lofaso: Treating Makoa Satz/Extender: Cheree Ditto in Treatment: 1 Vital Signs Time Taken: 10:55 Temperature (F): 98.8 Height (in): 69 Pulse (bpm): 118 Weight (lbs): 273 Respiratory Rate (breaths/min): 20 Body Mass Index (BMI): 40.3 Blood Pressure (mmHg): 137/85 Reference Range: 80 - 120 mg / dl Electronic Signature(s) Signed: 12/19/2019 7:52:14 AM By: Sandre Kitty Entered By: Sandre Kitty on 12/17/2019 10:55:30

## 2019-12-20 ENCOUNTER — Other Ambulatory Visit (HOSPITAL_COMMUNITY)
Admission: RE | Admit: 2019-12-20 | Discharge: 2019-12-20 | Disposition: A | Discharge status: Home / Self Care | Payer: Medicaid Other | Source: Other Acute Inpatient Hospital | Attending: Internal Medicine | Admitting: Internal Medicine

## 2019-12-20 ENCOUNTER — Encounter (HOSPITAL_BASED_OUTPATIENT_CLINIC_OR_DEPARTMENT_OTHER): Payer: Medicaid Other | Admitting: Internal Medicine

## 2019-12-20 ENCOUNTER — Other Ambulatory Visit: Payer: Self-pay

## 2019-12-20 DIAGNOSIS — L97823 Non-pressure chronic ulcer of other part of left lower leg with necrosis of muscle: Secondary | ICD-10-CM | POA: Diagnosis present

## 2019-12-20 DIAGNOSIS — E11622 Type 2 diabetes mellitus with other skin ulcer: Secondary | ICD-10-CM | POA: Diagnosis not present

## 2019-12-20 NOTE — Progress Notes (Signed)
[name], [name] ([id number]) Visit Report for 12/20/2019 Arrival Information Details Patient Name: Date of Service: [name], [name] RET 12/20/2019 2:30 PM Medical Record Number: [id number] Patient Account Number: [id number] Date of Birth/Sex: Treating RN: 05/07/1985 (34 y.o. [name], [name] Primary Care Alvey Brockel: PA Haig Prophet, [place] Other Clinician: Referring Toluwani Ruder: Treating Jenevie Casstevens/Extender: Cheree Ditto in Treatment: 1 Visit Information History Since Last Visit Added or deleted any medications: No Patient Arrived: Ambulatory Any new allergies or adverse reactions: No Arrival Time: 14:52 Had a fall or experienced change in No Accompanied By: self activities of daily living that may affect Transfer Assistance: None risk of Reyes: Patient Identification Verified: Yes Signs or symptoms of abuse/neglect since last visito No Secondary Verification Process Completed: Yes Hospitalized since last visit: No Patient Requires Transmission-Based Precautions: No Implantable device outside of the clinic excluding No Patient Has Alerts: No cellular tissue based products placed in the center since last visit: Has Dressing in Place as Prescribed: Yes Has Compression in Place as Prescribed: Yes Pain Present Now: Yes Electronic Signature(s) Signed: 12/20/2019 5:28:37 PM By: Rhae Hammock RN Entered By: Rhae Hammock on 12/20/2019 14:58:39 -------------------------------------------------------------------------------- Compression Therapy Details Patient Name: Date of Service: [name] RET 12/20/2019 2:30 PM Medical Record Number: [id number] Patient Account Number: [id number] Date of Birth/Sex: Treating RN: 1985-10-22 (34 y.o. [name], [name] Primary Care Purcell Jungbluth: PA Haig Prophet, [place] Other Clinician: Referring Tia Hieronymus: Treating Tashiya Souders/Extender: Cheree Ditto in Treatment: 1 Compression Therapy Performed for Wound Assessment: Wound #1 Left,Circumferential Lower  Leg Performed By: Clinician Rhae Hammock, RN Compression Type: Rolena Infante Electronic Signature(s) Signed: 12/20/2019 5:28:37 PM By: Rhae Hammock RN Entered By: Rhae Hammock on 12/20/2019 15:47:54 -------------------------------------------------------------------------------- Encounter Discharge Information Details Patient Name: Date of Service: [name], [name] RET 12/20/2019 2:30 PM Medical Record Number: [id number] Patient Account Number: [id number] Date of Birth/Sex: Treating RN: 12-Sep-1985 (34 y.o. [name], [name] Primary Care Shanicqua Coldren: PA Haig Prophet, [place] Other Clinician: Referring Shavone Nevers: Treating [name]/Extender: Cheree Ditto in Treatment: 1 Encounter Discharge Information Items Discharge Condition: Stable Ambulatory Status: Ambulatory Discharge Destination: Home Transportation: Private Auto Accompanied By: self Schedule Follow-up Appointment: Yes Clinical Summary of Care: Patient Declined Electronic Signature(s) Signed: 12/20/2019 5:28:37 PM By: Rhae Hammock RN Entered By: Rhae Hammock on 12/20/2019 15:48:36 -------------------------------------------------------------------------------- Patient/Caregiver Education Details Patient Name: Date of Service: [name] RET 12/17/2021andnbsp2:30 PM Medical Record Number: [id number] Patient Account Number: [id number] Date of Birth/Gender: Treating RN: [birth year] (34 y.o. [name], [name] Primary Care Physician: PA Haig Prophet, [place] Other Clinician: Referring Physician: Treating Physician/Extender: Cheree Ditto in Treatment: 1 Education Assessment Education Provided To: Patient Education Topics Provided Elevated Blood Sugar/ Impact on Healing: Methods: Explain/Verbal Responses: State content correctly Electronic Signature(s) Signed: 12/20/2019 5:28:37 PM By: Rhae Hammock RN Entered By: Rhae Hammock on 12/20/2019  15:48:17 -------------------------------------------------------------------------------- Wound Assessment Details Patient Name: Date of Service: [name] RET 12/20/2019 2:30 PM Medical Record Number: [id number] Patient Account Number: [id number] Date of Birth/Sex: Treating RN: October 23, 1985 (34 y.o. [name], [name] Primary Care Jaileigh Weimer: PA TIENT, [place] Other Clinician: Referring Shenique Childers: Treating Akela Pocius/Extender: Cheree Ditto in Treatment: 1 Wound Status Wound Number: 1 Primary Etiology: Auto-immune Wound Location: Left, Circumferential Lower Leg Secondary Etiology: Gangrene of Digits/ Extremity, Non-Diabetic Wounding Event: Gradually Appeared Wound Status: Open Date Acquired: 11/22/2016 Weeks Of Treatment: 1 Clustered Wound: No Wound Measurements Length: (cm) 14 Width: (cm) 28.5 Depth: (cm) 1 Area: (cm) 313.374 Volume: (cm) 313.374 % Reduction in Area: 5% % Reduction in Volume: 5% Wound Description Classification: Full Thickness  With Exposed Support Structures Treatment Notes Wound #1 (Lower Leg) Wound Laterality: Left, Circumferential Cleanser Soap and Water Discharge Instruction: May shower and wash wound with dial antibacterial soap and water prior to dressing change. Wound Cleanser Discharge Instruction: Cleanse the wound with wound cleanser prior to applying a clean dressing using gauze sponges, not tissue or cotton balls. Peri-Wound Care Topical Triamcinolone Discharge Instruction: Apply Triamcinolone as directed Primary Dressing KerraCel Ag Gelling Fiber Dressing, 4x5 in (silver alginate) Discharge Instruction: Apply TCA under Alginate ag. Secondary Dressing ABD Pad, 5x9 Discharge Instruction: Apply over primary dressing as directed. Zetuvit Plus 4x8 in Discharge Instruction: Apply over primary dressing as directed. Secured With Compression Wrap Unnaboot w/Calamine, 4x10 (in/yd) Discharge Instruction: Apply Unnaboot as  directed. Compression Stockings Add-Ons Electronic Signature(s) Signed: 12/20/2019 5:28:37 PM By: Rhae Hammock RN Entered By: Rhae Hammock on 12/20/2019 15:35:24 -------------------------------------------------------------------------------- Vitals Details Patient Name: Date of Service: [place], [place] RET 12/20/2019 2:30 PM Medical Record Number: [id number] Patient Account Number: [id number] Date of Birth/Sex: Treating RN: April 17, 1985 (34 y.o. [name], [name] Primary Care Javell Blackburn: PA TIENT, [place] Other Clinician: Referring Taytum Wheller: Treating Fannye Myer/Extender: Cheree Ditto in Treatment: 1 Vital Signs Time Taken: 15:00 Temperature (F): 98.2 Height (in): 69 Pulse (bpm): 108 Weight (lbs): 273 Respiratory Rate (breaths/min): 20 Body Mass Index (BMI): 40.3 Blood Pressure (mmHg): 118/72 Reference Range: 80 - 120 mg / dl Electronic Signature(s) Signed: 12/20/2019 5:28:37 PM By: Rhae Hammock RN Entered By: Rhae Hammock on 12/20/2019 15:00:58

## 2019-12-23 LAB — AEROBIC CULTURE W GRAM STAIN (SUPERFICIAL SPECIMEN): Gram Stain: NONE SEEN

## 2019-12-23 NOTE — Progress Notes (Signed)
ZISSEL, BIEDERMAN ([id number]) Visit Report for 12/20/2019 SuperBill Details Patient Name: Date of Service: [place], [place] [facility] RET 12/20/2019 Medical Record Number: [id number] Patient Account Number: [id number] Date of Birth/Sex: Treating RN: 1985/07/16 (34 y.o. 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

## 2019-12-24 ENCOUNTER — Encounter (HOSPITAL_BASED_OUTPATIENT_CLINIC_OR_DEPARTMENT_OTHER): Payer: Medicaid Other | Admitting: Internal Medicine

## 2020-01-01 ENCOUNTER — Encounter (HOSPITAL_BASED_OUTPATIENT_CLINIC_OR_DEPARTMENT_OTHER): Payer: Medicaid Other | Admitting: Physician Assistant

## 2020-01-04 ENCOUNTER — Encounter (HOSPITAL_COMMUNITY): Payer: Self-pay | Admitting: Emergency Medicine

## 2020-01-04 ENCOUNTER — Other Ambulatory Visit: Payer: Self-pay

## 2020-01-04 ENCOUNTER — Inpatient Hospital Stay (HOSPITAL_COMMUNITY)
Admission: EM | Admit: 2020-01-04 | Discharge: 2020-01-13 | DRG: 574 | Disposition: A | Discharge status: Home Health | Payer: Medicaid Other | Attending: Internal Medicine | Admitting: Internal Medicine

## 2020-01-04 ENCOUNTER — Emergency Department (HOSPITAL_COMMUNITY): Payer: Medicaid Other

## 2020-01-04 DIAGNOSIS — M609 Myositis, unspecified: Secondary | ICD-10-CM | POA: Diagnosis not present

## 2020-01-04 DIAGNOSIS — M86662 Other chronic osteomyelitis, left tibia and fibula: Secondary | ICD-10-CM | POA: Diagnosis present

## 2020-01-04 DIAGNOSIS — M069 Rheumatoid arthritis, unspecified: Secondary | ICD-10-CM | POA: Diagnosis present

## 2020-01-04 DIAGNOSIS — L88 Pyoderma gangrenosum: Secondary | ICD-10-CM | POA: Diagnosis not present

## 2020-01-04 DIAGNOSIS — E11628 Type 2 diabetes mellitus with other skin complications: Secondary | ICD-10-CM | POA: Diagnosis not present

## 2020-01-04 DIAGNOSIS — B9689 Other specified bacterial agents as the cause of diseases classified elsewhere: Secondary | ICD-10-CM | POA: Diagnosis present

## 2020-01-04 DIAGNOSIS — Z6841 Body Mass Index (BMI) 40.0 and over, adult: Secondary | ICD-10-CM | POA: Diagnosis not present

## 2020-01-04 DIAGNOSIS — F17210 Nicotine dependence, cigarettes, uncomplicated: Secondary | ICD-10-CM | POA: Diagnosis present

## 2020-01-04 DIAGNOSIS — Z7984 Long term (current) use of oral hypoglycemic drugs: Secondary | ICD-10-CM | POA: Diagnosis not present

## 2020-01-04 DIAGNOSIS — M62838 Other muscle spasm: Secondary | ICD-10-CM | POA: Diagnosis not present

## 2020-01-04 DIAGNOSIS — B373 Candidiasis of vulva and vagina: Secondary | ICD-10-CM | POA: Diagnosis not present

## 2020-01-04 DIAGNOSIS — R112 Nausea with vomiting, unspecified: Secondary | ICD-10-CM | POA: Diagnosis present

## 2020-01-04 DIAGNOSIS — M60062 Infective myositis, left lower leg: Secondary | ICD-10-CM | POA: Diagnosis present

## 2020-01-04 DIAGNOSIS — A419 Sepsis, unspecified organism: Secondary | ICD-10-CM

## 2020-01-04 DIAGNOSIS — D509 Iron deficiency anemia, unspecified: Secondary | ICD-10-CM | POA: Diagnosis present

## 2020-01-04 DIAGNOSIS — T148XXA Other injury of unspecified body region, initial encounter: Secondary | ICD-10-CM | POA: Diagnosis not present

## 2020-01-04 DIAGNOSIS — I739 Peripheral vascular disease, unspecified: Secondary | ICD-10-CM | POA: Diagnosis present

## 2020-01-04 DIAGNOSIS — S81802A Unspecified open wound, left lower leg, initial encounter: Secondary | ICD-10-CM | POA: Diagnosis not present

## 2020-01-04 DIAGNOSIS — L089 Local infection of the skin and subcutaneous tissue, unspecified: Secondary | ICD-10-CM

## 2020-01-04 DIAGNOSIS — Z20822 Contact with and (suspected) exposure to covid-19: Secondary | ICD-10-CM | POA: Diagnosis present

## 2020-01-04 DIAGNOSIS — Z888 Allergy status to other drugs, medicaments and biological substances status: Secondary | ICD-10-CM

## 2020-01-04 DIAGNOSIS — L03116 Cellulitis of left lower limb: Principal | ICD-10-CM | POA: Diagnosis present

## 2020-01-04 DIAGNOSIS — E669 Obesity, unspecified: Secondary | ICD-10-CM

## 2020-01-04 DIAGNOSIS — E876 Hypokalemia: Secondary | ICD-10-CM | POA: Diagnosis present

## 2020-01-04 DIAGNOSIS — B1920 Unspecified viral hepatitis C without hepatic coma: Secondary | ICD-10-CM | POA: Diagnosis present

## 2020-01-04 DIAGNOSIS — Z7952 Long term (current) use of systemic steroids: Secondary | ICD-10-CM | POA: Diagnosis not present

## 2020-01-04 DIAGNOSIS — E1169 Type 2 diabetes mellitus with other specified complication: Secondary | ICD-10-CM | POA: Diagnosis present

## 2020-01-04 DIAGNOSIS — E872 Acidosis: Secondary | ICD-10-CM | POA: Diagnosis present

## 2020-01-04 DIAGNOSIS — B965 Pseudomonas (aeruginosa) (mallei) (pseudomallei) as the cause of diseases classified elsewhere: Secondary | ICD-10-CM | POA: Diagnosis present

## 2020-01-04 DIAGNOSIS — E119 Type 2 diabetes mellitus without complications: Secondary | ICD-10-CM | POA: Diagnosis not present

## 2020-01-04 HISTORY — DX: Pyoderma gangrenosum: L88

## 2020-01-04 LAB — CBC WITH DIFFERENTIAL/PLATELET
Abs Immature Granulocytes: 0.08 10*3/uL — ABNORMAL HIGH (ref 0.00–0.07)
Basophils Absolute: 0.1 10*3/uL (ref 0.0–0.1)
Basophils Relative: 1 %
Eosinophils Absolute: 0.4 10*3/uL (ref 0.0–0.5)
Eosinophils Relative: 4 %
HCT: 33.4 % — ABNORMAL LOW (ref 36.0–46.0)
Hemoglobin: 9.6 g/dL — ABNORMAL LOW (ref 12.0–15.0)
Immature Granulocytes: 1 %
Lymphocytes Relative: 29 %
Lymphs Abs: 3.2 10*3/uL (ref 0.7–4.0)
MCH: 22.8 pg — ABNORMAL LOW (ref 26.0–34.0)
MCHC: 28.7 g/dL — ABNORMAL LOW (ref 30.0–36.0)
MCV: 79.3 fL — ABNORMAL LOW (ref 80.0–100.0)
Monocytes Absolute: 0.8 10*3/uL (ref 0.1–1.0)
Monocytes Relative: 7 %
Neutro Abs: 6.6 10*3/uL (ref 1.7–7.7)
Neutrophils Relative %: 58 %
Platelets: 364 10*3/uL (ref 150–400)
RBC: 4.21 MIL/uL (ref 3.87–5.11)
RDW: 18.7 % — ABNORMAL HIGH (ref 11.5–15.5)
WBC: 11.1 10*3/uL — ABNORMAL HIGH (ref 4.0–10.5)
nRBC: 0 % (ref 0.0–0.2)

## 2020-01-04 LAB — URINALYSIS, ROUTINE W REFLEX MICROSCOPIC
Bilirubin Urine: NEGATIVE
Glucose, UA: NEGATIVE mg/dL
Hgb urine dipstick: NEGATIVE
Ketones, ur: NEGATIVE mg/dL
Leukocytes,Ua: NEGATIVE
Nitrite: NEGATIVE
Protein, ur: NEGATIVE mg/dL
Specific Gravity, Urine: 1.013 (ref 1.005–1.030)
pH: 6 (ref 5.0–8.0)

## 2020-01-04 LAB — COMPREHENSIVE METABOLIC PANEL
ALT: 41 U/L (ref 0–44)
AST: 24 U/L (ref 15–41)
Albumin: 3.1 g/dL — ABNORMAL LOW (ref 3.5–5.0)
Alkaline Phosphatase: 133 U/L — ABNORMAL HIGH (ref 38–126)
Anion gap: 14 (ref 5–15)
BUN: 7 mg/dL (ref 6–20)
CO2: 18 mmol/L — ABNORMAL LOW (ref 22–32)
Calcium: 8.9 mg/dL (ref 8.9–10.3)
Chloride: 106 mmol/L (ref 98–111)
Creatinine, Ser: 0.89 mg/dL (ref 0.44–1.00)
GFR, Estimated: >60 mL/min (ref >60)
Glucose, Bld: 179 mg/dL — ABNORMAL HIGH (ref 70–99)
Potassium: 3.1 mmol/L — ABNORMAL LOW (ref 3.5–5.1)
Sodium: 138 mmol/L (ref 135–145)
Total Bilirubin: 0.5 mg/dL (ref 0.3–1.2)
Total Protein: 7 g/dL (ref 6.5–8.1)

## 2020-01-04 LAB — LACTIC ACID, PLASMA
Lactic Acid, Venous: 3.1 mmol/L (ref 0.5–1.9)
Lactic Acid, Venous: 1 mmol/L (ref 0.5–1.9)

## 2020-01-04 LAB — CBG MONITORING, ED: Glucose-Capillary: 166 mg/dL — ABNORMAL HIGH (ref 70–99)

## 2020-01-04 LAB — PROTIME-INR
INR: 1.1 (ref 0.8–1.2)
Prothrombin Time: 13.4 seconds (ref 11.4–15.2)

## 2020-01-04 LAB — I-STAT BETA HCG BLOOD, ED (MC, WL, AP ONLY): I-stat hCG, quantitative: <5 m[IU]/mL (ref <5)

## 2020-01-04 MED ORDER — HYDROMORPHONE HCL 1 MG/ML IJ SOLN
1.0000 mg | Freq: Once | INTRAMUSCULAR | Status: AC
Start: 2020-01-04 — End: 2020-01-04
  Administered 2020-01-04: 1 mg via INTRAVENOUS
  Filled 2020-01-04: qty 1

## 2020-01-04 MED ORDER — VANCOMYCIN HCL 1750 MG/350ML IV SOLN
1750.0000 mg | Freq: Two times a day (BID) | INTRAVENOUS | Status: DC
  Administered 2020-01-05 – 2020-01-06 (×3): 1750 mg via INTRAVENOUS
  Filled 2020-01-04 (×3): qty 350

## 2020-01-04 MED ORDER — LACTATED RINGERS IV SOLN: INTRAVENOUS | Status: AC

## 2020-01-04 MED ORDER — SODIUM CHLORIDE 0.9 % IV SOLN
2.0000 g | Freq: Once | INTRAVENOUS | Status: AC
  Administered 2020-01-04: 2 g via INTRAVENOUS
  Filled 2020-01-04: qty 20

## 2020-01-04 MED ORDER — LACTATED RINGERS IV BOLUS: 1000.0000 mL | Freq: Once | INTRAVENOUS | Status: DC

## 2020-01-04 MED ORDER — VANCOMYCIN HCL IN DEXTROSE 1-5 GM/200ML-% IV SOLN: 1000.0000 mg | Freq: Once | INTRAVENOUS | Status: DC

## 2020-01-04 MED ORDER — VANCOMYCIN HCL 2000 MG/400ML IV SOLN
2000.0000 mg | Freq: Once | INTRAVENOUS | Status: AC
  Administered 2020-01-04: 2000 mg via INTRAVENOUS
  Filled 2020-01-04: qty 400

## 2020-01-04 NOTE — Progress Notes (Signed)
Code Sepsis initiated @ 1852, ELINK following.

## 2020-01-04 NOTE — ED Triage Notes (Addendum)
Pt presents with wound to left leg, pt currently receiving treatment for wound. C/o increased pain, foul odor, and fevers at home. Hx diabetes.

## 2020-01-04 NOTE — Progress Notes (Signed)
Elink Code Sepsis note:  ABX and IVF delayed due to no access, awaiting IV team consult for placement.   [name] [employer] RN

## 2020-01-04 NOTE — H&P (Signed)
History and Physical    Lun Muro ZWC:[id number] DOB: 1985-01-19 DOA: 01/04/2020  PCP: Patient, No Pcp Per   Patient coming from: Home  Chief Complaint: Left leg pain with worsening wound on left leg  HPI: [name] is a 35 y.o. female with medical history significant for DMT2, pyoderma gangrenosum, chronic wound of left leg that is followed at wound clinic. She presents for evaluation of fever with worsening leg pain and purulent drainage. Patient states she has had a chronic wound of her left lower leg for the past 3 years.  It has worsened over the past several weeks, and has dramatically increased in size in depth in past week she reports.  She reports last night she developed foul-smelling purulent drainage from the wound and has increased pain in her leg. She did fever to 102 degrees at home last night and had associated nausea and vomiting.  She is being followed with the wound clinic, but due to overcrowding/scheduling difficulties, she has not been able to go as regularly as she wanted to. She is not currently on any antibiotics.    She states that her diabetes is well controlled. Denies any injury or trauma to her leg.  She is found to have chronic osteomyelitis changes on x-ray of her left lower leg with a extensive anterior wound that is deep and muscle and tendon are exposed.  Review of Systems:  General: Fever to 102 degrees.  Denies weight loss, night sweats.  Denies dizziness.  Denies change in appetite HENT: Denies head trauma, denies change in hearing, tinnitus.  Denies nasal congestion or bleeding.  Denies sore throat, sores in mouth.  Denies difficulty swallowing Eyes: Denies blurry vision, pain in eye, drainage.  Denies discoloration of eyes. Neck: Denies pain.  Denies swelling.  Denies pain with movement. Cardiovascular: Denies chest pain, palpitations.  Denies edema.  Denies orthopnea Respiratory: Denies shortness of breath, cough.  Denies wheezing.  Denies sputum  production Gastrointestinal: Denies abdominal pain, swelling.  Reports nausea but no vomiting, diarrhea.  Denies melena.  Denies hematemesis. Musculoskeletal: Reports increasing pain in left lower leg with worsening chronic wound.  Denies limitation of movement. Genitourinary: Denies pelvic pain.  Denies urinary frequency or hesitancy.  Denies dysuria.  Skin: Denies rash.  Denies petechiae, purpura, ecchymosis. Neurological: Denies headache.  Denies syncope.  Denies seizure activity. Denies slurred speech, drooping face.  Denies visual change. Psychiatric: Denies depression, anxiety. Denies hallucinations.  Past Medical History:  Diagnosis Date  . Diabetes mellitus without complication (HCC)    with pregnancy    Past Surgical History:  Procedure Laterality Date  . APPLICATION OF A-CELL OF EXTREMITY Left 01/19/2017   Procedure: APPLICATION OF A-CELL OF EXTREMITY AND WOUND VAC;  Surgeon: Peggye Form, DO;  Location: [facility];  Service: Plastics;  Laterality: Left;  . APPLICATION OF WOUND VAC Left 01/16/2017   Procedure: APPLICATION OF WOUND VAC;  Surgeon: Jodi Geralds, MD;  Location: [facility] OR;  Service: Orthopedics;  Laterality: Left;  . CESAREAN SECTION    . I & D EXTREMITY Left 01/16/2017   Procedure: DEBRIDEMENT OF LEFT ANTERIOR AND POSTERLATERAL TIBIAL WOUNDS;  Surgeon: Jodi Geralds, MD;  Location: [facility] OR;  Service: Orthopedics;  Laterality: Left;  . I & D EXTREMITY Left 01/19/2017   Procedure: IRRIGATION AND DEBRIDEMENT EXTREMITY;  Surgeon: Peggye Form, DO;  Location: [facility] OR;  Service: Plastics;  Laterality: Left;    Social History  reports that she has been smoking cigarettes. She has been  smoking about 0.50 packs per day. She has never used smokeless tobacco. She reports that she does not drink alcohol and does not use drugs.  Allergies  Allergen Reactions  . Carbamazepine Other (See Comments)    Causes aplastic anemia Other reaction(s): Other (See Comments) Causes  aplastic anemia "TEGRETOL"   . Zolpidem Tartrate Rash    "AMBIEN"     History reviewed. No pertinent family history.   Prior to Admission medications   Medication Sig Start Date End Date Taking? Authorizing Provider  acetaminophen (TYLENOL) 325 MG tablet Take 650 mg by mouth every 6 (six) hours as needed for mild pain.    [provider]  ibuprofen (ADVIL,MOTRIN) 200 MG tablet Take 200 mg by mouth every 6 (six) hours as needed for mild pain.    [provider]  methocarbamol (ROBAXIN) 500 MG tablet Take 1 tablet (500 mg total) by mouth every 6 (six) hours as needed for muscle spasms. 01/21/17   Guadalupe Dawn, MD  oxyCODONE-acetaminophen (PERCOCET/ROXICET) 5-325 MG tablet Take 1 tablet by mouth every 8 (eight) hours as needed for moderate pain. 01/22/17   McDiarmid, Blane Ohara, MD    Physical Exam: Vitals:   01/04/20 2200 01/04/20 2228 01/04/20 2230 01/04/20 2245  BP: (!) 152/74  139/69 113/69  Pulse: (!) 104  78 80  Resp: (!) 24  18 14   Temp:      TempSrc:      SpO2: 98%  99% 97%  Weight:  123.8 kg    Height:  5\' 8"  (1.727 m)      Constitutional: NAD, calm, comfortable Vitals:   01/04/20 2200 01/04/20 2228 01/04/20 2230 01/04/20 2245  BP: (!) 152/74  139/69 113/69  Pulse: (!) 104  78 80  Resp: (!) 24  18 14   Temp:      TempSrc:      SpO2: 98%  99% 97%  Weight:  123.8 kg    Height:  5\' 8"  (1.727 m)     General: WDWN, Alert and oriented x3.  Eyes: EOMI, PERRL, conjunctivae normal.  Sclera nonicteric HENT:  [place]/AT, external ears normal.  Nares patent without epistasis.  Mucous membranes are moist.  Neck: Soft, normal range of motion, supple, no masses, Trachea midline Respiratory: clear to auscultation bilaterally, no wheezing, no crackles. Normal respiratory effort. No accessory muscle use.  Cardiovascular: Regular rate and rhythm, no murmurs / rubs / gallops.  Abdomen: Soft, no tenderness, nondistended, no rebound or guarding.  Morbidly obese.  No masses  palpated. Bowel sounds normoactive Musculoskeletal: FROM. no cyanosis.  Extensive wound of the anterior left lower leg with purulent drainage.  Tender to palpation with surrounding erythema and induration.  Wound is deep with tendons and muscle exposed Skin: Warm, dry, intact no rashes, lesions, ulcers. No induration Neurologic: CN 2-12 grossly intact.  Normal speech.  Sensation intact. Strength 5/5 in all extremities.   Psychiatric: Normal judgment and insight.  Normal mood.    Labs on Admission: I have personally reviewed following labs and imaging studies  CBC: Recent Labs  Lab 01/04/20 1746  WBC 11.1*  NEUTROABS 6.6  HGB 9.6*  HCT 33.4*  MCV 79.3*  PLT 123456    Basic Metabolic Panel: Recent Labs  Lab 01/04/20 1746  NA 138  K 3.1*  CL 106  CO2 18*  GLUCOSE 179*  BUN 7  CREATININE 0.89  CALCIUM 8.9    GFR: Estimated Creatinine Clearance: 121.3 mL/min (by C-G formula based on SCr of 0.89 mg/dL).  Liver Function Tests: Recent Labs  Lab 01/04/20 1746  AST 24  ALT 41  ALKPHOS 133*  BILITOT 0.5  PROT 7.0  ALBUMIN 3.1*    Urine analysis:    Component Value Date/Time   COLORURINE YELLOW 01/04/2020 2233   APPEARANCEUR CLEAR 01/04/2020 2233   LABSPEC 1.013 01/04/2020 2233   PHURINE 6.0 01/04/2020 2233   GLUCOSEU NEGATIVE 01/04/2020 2233   HGBUR NEGATIVE 01/04/2020 2233   Bell NEGATIVE 01/04/2020 2233   KETONESUR NEGATIVE 01/04/2020 2233   PROTEINUR NEGATIVE 01/04/2020 2233   NITRITE NEGATIVE 01/04/2020 2233   LEUKOCYTESUR NEGATIVE 01/04/2020 2233    Radiological Exams on Admission: DG Tibia/Fibula Left  Result Date: 01/04/2020 CLINICAL DATA:  Left leg wound, increased drainage, foul odor EXAM: LEFT TIBIA AND FIBULA - 2 VIEW COMPARISON:  01/16/2017 FINDINGS: The soft tissue defect at the level of the distal diaphysis of the tibia and fibula has markedly increased in size in keeping with interval surgical debridement. There is extensive subcutaneous  edema within the left lower extremity more proximal to the area of debris mint. There is developing exuberant periosteal reaction involving the anterior cortex of the distal tibia and medial cortex of the a fibula in the region of debridement suspicious for chronic osteomyelitis or, less likely, reactive hyperostosis. No associated fracture or dislocation. IMPRESSION: Interval debridement with enlarging surgical defect. Developing exuberant periosteal reaction suspicious for changes of chronic osteomyelitis involving the distal tibia and fibula diaphyses. This could be better assessed with contrast enhanced MRI examination. Electronically Signed   By: Fidela Salisbury MD   On: 01/04/2020 20:28    Assessment/Plan Principal Problem:   Chronic osteomyelitis involving lower leg, left  Ms. Ramakrishnan is admitted to [facility] floor.  She is started on Rocephin and vancomycin empirically for antibiotic coverage. X-ray of left leg showed chronic osseous changes but MRI will be better to completely evaluate.  MRI of left leg is ordered Pain control provided with Dilaudid for severe pain.  Active Problems:   Wound infection Patient on antibiotic as above. Consult wound care in the morning    Diabetes mellitus type 2 in obese  Patient reports that she takes [place] and Metformin but these were not on her home medication list.  Her medications to be verified and reconciled by pharmacy and then resumed.  In the meantime will monitor blood sugars with meals and at bedtime and provide SSI for glycemic control. Check Hemoglobin A1c    Obesity, Class III, BMI 40-49.9 (morbid obesity)  Follow-up with PCP for dietary lifestyle interventions for weight loss.  Patient may benefit from referral to bariatric surgery for evaluation     DVT prophylaxis: Lovenox for DVT prophylaxis Code Status:   Full code Family Communication:  Diagnosis and plan discussed with patient.  Patient verbalized understanding.  Questions  answered.  Further recommendations to follow as clinical indicated Disposition Plan:   Patient is from:  Home  Anticipated DC to:  Home  Anticipated DC date:  Anticipate more than 2 midnight stay in the hospital to treat acute condition  Anticipated DC barriers: No barriers to discharge identified at this time.  Patient may need home health      services  Eben Burow MD [facility]  How to contact the [facility] Attending or Consulting provider [place] or covering provider during after hours [place], for this patient?   1. Check the care team in [facility] and look for a) attending/consulting [place] provider listed and b) the  [place] team listed 2. Log into [email or web address] and use [place]'s universal password to access. If you do not have the password, please contact the hospital operator. 3. Locate the [facility] provider you are looking for under [facility] and page to a number that you can be directly reached. 4. If you still have difficulty reaching the provider, please page the [facility] (Director on Call) for the Hospitalists listed on amion for assistance.  01/04/2020, 11:13 PM

## 2020-01-04 NOTE — Progress Notes (Signed)
Pharmacy Antibiotic Note  [name] is a 35 y.o. female admitted on 01/04/2020 with Wound infection .  Pharmacy has been consulted for Vancomycin dosing.   Height: 5\' 9"  (175.3 cm) Weight: 123.8 kg (273 lb) IBW/kg (Calculated) : 66.2  Temp (24hrs), Avg:98.6 F (37 C), Min:98.6 F (37 C), Max:98.6 F (37 C)  Recent Labs  Lab 01/04/20 1746  WBC 11.1*  CREATININE 0.89  LATICACIDVEN 3.1*    Estimated Creatinine Clearance: 123.1 mL/min (by C-G formula based on SCr of 0.89 mg/dL).    Allergies  Allergen Reactions  . Tegretol [Carbamazepine] Other (See Comments)    Causes aplastic anemia  . Ambien [Zolpidem Tartrate] Rash    Antimicrobials this admission: 11/1 Ceftriaxone >>  1/1 Vancomycin >>   Dose adjustments this admission: N/a  Microbiology results: Pending   Plan:  - Vancomycin 2000mg  IV x 1 dose  - Followed by Vancomycin 1750mg  IV q12h - Est Calc AUC 488 - Monitor patients renal function and urine output  - De-escalate ABX when appropriate   Thank you for allowing pharmacy to be a part of this patient's care.  13/1 PharmD. BCPS 01/04/2020 7:29 PM

## 2020-01-04 NOTE — ED Notes (Signed)
RN placed order for IV team consult due to pt difficult stick & antibiotics due

## 2020-01-04 NOTE — ED Provider Notes (Signed)
[place] EMERGENCY DEPARTMENT Provider Note   CSN: [id number] Arrival date & time: 01/04/20  1725     History Chief Complaint  Patient presents with  . Wound Infection    [name] is a 35 y.o. female presented for evaluation of fevers, leg pain, purulent drainage.  Patient states she has had a chronic wound of her left lower leg for 3 years.  It has worsened over the past several weeks, especially in the past 1 week.  She reports last night she developed foul-smelling purulent drainage from the wound.  She also reports subjective fevers, associated nausea and vomiting.  She reports significantly worsening pain.  She is being followed with the wound clinic, but due to overcrowding/scheduling difficulties, she has not been able to go as regularly as wanted.  She is not currently on any antibiotics.  She has a history of pyoderma gangrenosum.  Additional history of diabetes, which she reports is well controlled.  HPI     Past Medical History:  Diagnosis Date  . Diabetes mellitus without complication [facility])    with pregnancy    Patient Active Problem List   Diagnosis Date Noted  . Diabetes mellitus ([place]) 01/17/2017  . Ulcers of both lower legs ([place]) 01/17/2017  . History of endometriosis 01/17/2017  . Microcytic anemia 01/17/2017  . Cigarette smoker 01/17/2017  . History of bipolar disorder 01/17/2017  . Recurrent boils 01/17/2017  . IVDU (intravenous drug user) 01/17/2017  . Morbid obesity ([place]) 01/17/2017    Past Surgical History:  Procedure Laterality Date  . APPLICATION OF A-CELL OF EXTREMITY Left 01/19/2017   Procedure: APPLICATION OF A-CELL OF EXTREMITY AND WOUND VAC;  Surgeon: Wallace Going, DO;  Location: [place];  Service: Plastics;  Laterality: Left;  . APPLICATION OF WOUND VAC Left 01/16/2017   Procedure: APPLICATION OF WOUND VAC;  Surgeon: Dorna Leitz, MD;  Location: [place];  Service: Orthopedics;  Laterality: Left;  . CESAREAN  SECTION    . I & D EXTREMITY Left 01/16/2017   Procedure: DEBRIDEMENT OF LEFT ANTERIOR AND POSTERLATERAL TIBIAL WOUNDS;  Surgeon: Dorna Leitz, MD;  Location: [place];  Service: Orthopedics;  Laterality: Left;  . I & D EXTREMITY Left 01/19/2017   Procedure: IRRIGATION AND DEBRIDEMENT EXTREMITY;  Surgeon: Wallace Going, DO;  Location: [place];  Service: Plastics;  Laterality: Left;     OB History   No obstetric history on file.     No family history on file.  Social History   Tobacco Use  . Smoking status: Current Some Day Smoker    Packs/day: 0.50    Types: Cigarettes  . Smokeless tobacco: Never Used  Substance Use Topics  . Alcohol use: No  . Drug use: No    Home Medications Prior to Admission medications   Medication Sig Start Date End Date Taking? Authorizing Provider  acetaminophen (TYLENOL) 325 MG tablet Take 650 mg by mouth every 6 (six) hours as needed for mild pain.    [provider]  ibuprofen (ADVIL,MOTRIN) 200 MG tablet Take 200 mg by mouth every 6 (six) hours as needed for mild pain.    [provider]  methocarbamol (ROBAXIN) 500 MG tablet Take 1 tablet (500 mg total) by mouth every 6 (six) hours as needed for muscle spasms. 01/21/17   Guadalupe Dawn, MD  oxyCODONE-acetaminophen (PERCOCET/ROXICET) 5-325 MG tablet Take 1 tablet by mouth every 8 (eight) hours as needed for moderate pain. 01/22/17   McDiarmid, Blane Ohara,  MD    Allergies    Tegretol [carbamazepine] and Ambien [zolpidem tartrate]  Review of Systems   Review of Systems  Constitutional: Positive for fever.  Gastrointestinal: Positive for nausea and vomiting.  Skin: Positive for wound.  All other systems reviewed and are negative.   Physical Exam Updated Vital Signs BP (!) 142/97   Pulse 100   Temp 98.6 F (37 C) (Oral)   Resp 17   Ht 5\' 9"  (1.753 m)   Wt 123.8 kg   SpO2 99%   BMI 40.32 kg/m   Physical Exam Vitals and nursing note reviewed.  Constitutional:       General: She is not in acute distress.    Appearance: She is well-developed and well-nourished. She is obese.     Comments: Appears uncomfortable due to pain  HENT:     Head: Normocephalic and atraumatic.  Eyes:     Extraocular Movements: Extraocular movements intact and EOM normal.     Conjunctiva/sclera: Conjunctivae normal.     Pupils: Pupils are equal, round, and reactive to light.  Cardiovascular:     Rate and Rhythm: Regular rhythm. Tachycardia present.     Pulses: Normal pulses and intact distal pulses.     Comments: Tachycardic around 115 Pulmonary:     Effort: Pulmonary effort is normal. No respiratory distress.     Breath sounds: Normal breath sounds. No wheezing.  Abdominal:     General: There is no distension.     Palpations: Abdomen is soft. There is no mass.     Tenderness: There is no abdominal tenderness. There is no guarding or rebound.  Musculoskeletal:        General: Normal range of motion.     Cervical back: Normal range of motion and neck supple.  Skin:    General: Skin is warm and dry.     Capillary Refill: Capillary refill takes less than 2 seconds.     Comments: See pictures below.  Extensive wound of the left lower leg with purulent drainage.  Extreme tenderness.  No streaking.  Neurological:     Mental Status: She is alert and oriented to person, place, and time.  Psychiatric:        Mood and Affect: Mood and affect normal.            ED Results / Procedures / Treatments   Labs (all labs ordered are listed, but only abnormal results are displayed) Labs Reviewed  COMPREHENSIVE METABOLIC PANEL - Abnormal; Notable for the following components:      Result Value   Potassium 3.1 (*)    CO2 18 (*)    Glucose, Bld 179 (*)    Albumin 3.1 (*)    Alkaline Phosphatase 133 (*)    All other components within normal limits  LACTIC ACID, PLASMA - Abnormal; Notable for the following components:   Lactic Acid, Venous 3.1 (*)    All other components  within normal limits  CBC WITH DIFFERENTIAL/PLATELET - Abnormal; Notable for the following components:   WBC 11.1 (*)    Hemoglobin 9.6 (*)    HCT 33.4 (*)    MCV 79.3 (*)    MCH 22.8 (*)    MCHC 28.7 (*)    RDW 18.7 (*)    Abs Immature Granulocytes 0.08 (*)    All other components within normal limits  CBG MONITORING, ED - Abnormal; Notable for the following components:   Glucose-Capillary 166 (*)    All other components  within normal limits  CULTURE, BLOOD (ROUTINE X 2)  CULTURE, BLOOD (ROUTINE X 2)  SARS CORONAVIRUS 2 (TAT 6-24 HRS)  PROTIME-INR  LACTIC ACID, PLASMA  URINALYSIS, ROUTINE W REFLEX MICROSCOPIC  I-STAT BETA HCG BLOOD, ED ([facility], [facility], [facility] ONLY)    EKG None  Radiology No results found.  Procedures .Critical Care Performed by: Alveria Apley, PA-C Authorized by: Alveria Apley, PA-C   Critical care provider statement:    Critical care time (minutes):  40   Critical care time was exclusive of:  Separately billable procedures and treating other patients and teaching time   Critical care was necessary to treat or prevent imminent or life-threatening deterioration of the following conditions:  Sepsis   Critical care was time spent personally by me on the following activities:  Blood draw for specimens, development of treatment plan with patient or surrogate, evaluation of patient's response to treatment, examination of patient, obtaining history from patient or surrogate, ordering and performing treatments and interventions, ordering and review of laboratory studies, ordering and review of radiographic studies, pulse oximetry, re-evaluation of patient's condition and review of old charts   I assumed direction of critical care for this patient from another provider in my specialty: no     Care discussed with: admitting provider   Comments:     Pt with sirs criteria and leg infection. IV abx started and pt admitted.    (including critical care time)  Medications  Ordered in ED Medications  lactated ringers infusion (0 mLs Intravenous Hold 01/04/20 2001)  cefTRIAXone (ROCEPHIN) 2 g in sodium chloride 0.9 % 100 mL IVPB (0 g Intravenous Hold 01/04/20 2001)  HYDROmorphone (DILAUDID) injection 1 mg (0 mg Intravenous Hold 01/04/20 2001)  vancomycin (VANCOREADY) IVPB 2000 mg/400 mL (0 mg Intravenous Hold 01/04/20 2002)  vancomycin (VANCOREADY) IVPB 1750 mg/350 mL (has no administration in time range)  lactated ringers bolus 1,000 mL (0 mLs Intravenous Hold 01/04/20 2002)    ED Course  I have reviewed the triage vital signs and the nursing notes.  Pertinent labs & imaging results that were available during my care of the patient were reviewed by me and considered in my medical decision making (see chart for details).    MDM Rules/Calculators/A&P                          Presenting for evaluation of left leg infection.  On exam, patient is tachycardic, with noted pain.  However blood pressure stable.  Labs obtained in triage interpreted by me, she has mild leukocytosis and elevated lactic. in the setting of infection and tachycardia, she meets SIRS criteria.  Code sepsis called and abx ordered.  Labs otherwise overall reassuring.  She is mildly acidotic at 18, this could be due to infection and/or hyperventilation due to pain.  X-ray ordered to evaluate for gas.  X-ray viewed interpreted by me, no obvious gas consistent with necrotizing fasciitis.  Per radiology read, could have chronic osteo. Sepsis reassessment performed. HR improving. Pt will need to be admitted for large infected wound in the setting of diabetes.   Discussed with Dr. Rachael Darby from [facility], patient to be admitted.  Final Clinical Impression(s) / ED Diagnoses Final diagnoses:  Sepsis, due to unspecified organism, unspecified whether acute organ dysfunction present [facility])  Wound infection    Rx / DC Orders ED Discharge Orders    None       Alveria Apley, PA-C 01/04/20  2236  Varney Biles, MD 01/06/20 2337

## 2020-01-05 ENCOUNTER — Inpatient Hospital Stay (HOSPITAL_COMMUNITY): Payer: Medicaid Other

## 2020-01-05 DIAGNOSIS — L089 Local infection of the skin and subcutaneous tissue, unspecified: Secondary | ICD-10-CM

## 2020-01-05 DIAGNOSIS — T148XXA Other injury of unspecified body region, initial encounter: Secondary | ICD-10-CM

## 2020-01-05 DIAGNOSIS — L88 Pyoderma gangrenosum: Secondary | ICD-10-CM

## 2020-01-05 DIAGNOSIS — E1169 Type 2 diabetes mellitus with other specified complication: Secondary | ICD-10-CM

## 2020-01-05 DIAGNOSIS — E669 Obesity, unspecified: Secondary | ICD-10-CM | POA: Diagnosis not present

## 2020-01-05 DIAGNOSIS — M86662 Other chronic osteomyelitis, left tibia and fibula: Secondary | ICD-10-CM | POA: Diagnosis not present

## 2020-01-05 LAB — CBC
HCT: 31 % — ABNORMAL LOW (ref 36.0–46.0)
HCT: 35.2 % — ABNORMAL LOW (ref 36.0–46.0)
Hemoglobin: 9.1 g/dL — ABNORMAL LOW (ref 12.0–15.0)
Hemoglobin: 10.5 g/dL — ABNORMAL LOW (ref 12.0–15.0)
MCH: 22.8 pg — ABNORMAL LOW (ref 26.0–34.0)
MCH: 23.2 pg — ABNORMAL LOW (ref 26.0–34.0)
MCHC: 29.4 g/dL — ABNORMAL LOW (ref 30.0–36.0)
MCHC: 29.8 g/dL — ABNORMAL LOW (ref 30.0–36.0)
MCV: 77.7 fL — ABNORMAL LOW (ref 80.0–100.0)
MCV: 77.7 fL — ABNORMAL LOW (ref 80.0–100.0)
Platelets: 357 10*3/uL (ref 150–400)
Platelets: 283 10*3/uL (ref 150–400)
RBC: 3.99 MIL/uL (ref 3.87–5.11)
RBC: 4.53 MIL/uL (ref 3.87–5.11)
RDW: 18.6 % — ABNORMAL HIGH (ref 11.5–15.5)
RDW: 18.6 % — ABNORMAL HIGH (ref 11.5–15.5)
WBC: 10.2 10*3/uL (ref 4.0–10.5)
WBC: 6.9 10*3/uL (ref 4.0–10.5)
nRBC: 0 % (ref 0.0–0.2)

## 2020-01-05 LAB — HIV ANTIBODY (ROUTINE TESTING W REFLEX): HIV Screen 4th Generation wRfx: NONREACTIVE

## 2020-01-05 LAB — GLUCOSE, CAPILLARY: Glucose-Capillary: 144 mg/dL — ABNORMAL HIGH (ref 70–99)

## 2020-01-05 LAB — BASIC METABOLIC PANEL
Anion gap: 12 (ref 5–15)
BUN: 7 mg/dL (ref 6–20)
CO2: 19 mmol/L — ABNORMAL LOW (ref 22–32)
Calcium: 8.2 mg/dL — ABNORMAL LOW (ref 8.9–10.3)
Chloride: 108 mmol/L (ref 98–111)
Creatinine, Ser: 0.76 mg/dL (ref 0.44–1.00)
GFR, Estimated: >60 mL/min (ref >60)
Glucose, Bld: 120 mg/dL — ABNORMAL HIGH (ref 70–99)
Potassium: 3.2 mmol/L — ABNORMAL LOW (ref 3.5–5.1)
Sodium: 139 mmol/L (ref 135–145)

## 2020-01-05 LAB — HEMOGLOBIN A1C
Hgb A1c MFr Bld: 6.7 % — ABNORMAL HIGH (ref 4.8–5.6)
Mean Plasma Glucose: 145.59 mg/dL

## 2020-01-05 LAB — CREATININE, SERUM
Creatinine, Ser: 0.75 mg/dL (ref 0.44–1.00)
GFR, Estimated: >60 mL/min (ref >60)

## 2020-01-05 LAB — CBG MONITORING, ED
Glucose-Capillary: 101 mg/dL — ABNORMAL HIGH (ref 70–99)
Glucose-Capillary: 108 mg/dL — ABNORMAL HIGH (ref 70–99)
Glucose-Capillary: 159 mg/dL — ABNORMAL HIGH (ref 70–99)
Glucose-Capillary: 102 mg/dL — ABNORMAL HIGH (ref 70–99)

## 2020-01-05 LAB — SARS CORONAVIRUS 2 (TAT 6-24 HRS): SARS Coronavirus 2: NEGATIVE

## 2020-01-05 MED ORDER — PREGABALIN 100 MG PO CAPS
100.0000 mg | ORAL_CAPSULE | Freq: Three times a day (TID) | ORAL | Status: DC
  Administered 2020-01-05 – 2020-01-13 (×23): 100 mg via ORAL
  Filled 2020-01-05: qty 1
  Filled 2020-01-05: qty 4
  Filled 2020-01-05 (×21): qty 1

## 2020-01-05 MED ORDER — ACETAMINOPHEN 650 MG RE SUPP: 650.0000 mg | Freq: Four times a day (QID) | RECTAL | Status: DC | PRN

## 2020-01-05 MED ORDER — POTASSIUM CHLORIDE CRYS ER 20 MEQ PO TBCR
40.0000 meq | EXTENDED_RELEASE_TABLET | Freq: Two times a day (BID) | ORAL | Status: AC
  Administered 2020-01-05 – 2020-01-06 (×4): 40 meq via ORAL
  Filled 2020-01-05 (×4): qty 2

## 2020-01-05 MED ORDER — ONDANSETRON HCL 4 MG PO TABS: 4.0000 mg | ORAL_TABLET | Freq: Four times a day (QID) | ORAL | Status: DC | PRN

## 2020-01-05 MED ORDER — ACETAMINOPHEN 325 MG PO TABS
650.0000 mg | ORAL_TABLET | Freq: Four times a day (QID) | ORAL | Status: DC | PRN
  Administered 2020-01-07 – 2020-01-08 (×3): 650 mg via ORAL
  Filled 2020-01-05 (×3): qty 2

## 2020-01-05 MED ORDER — HYDROMORPHONE HCL 1 MG/ML IJ SOLN
1.0000 mg | INTRAMUSCULAR | Status: DC | PRN
  Administered 2020-01-05 – 2020-01-11 (×30): 1 mg via INTRAVENOUS
  Filled 2020-01-05 (×30): qty 1

## 2020-01-05 MED ORDER — LORAZEPAM 2 MG/ML IJ SOLN
INTRAMUSCULAR | Status: AC
  Administered 2020-01-05: 1 mg via INTRAVENOUS
  Filled 2020-01-05: qty 1

## 2020-01-05 MED ORDER — INSULIN ASPART 100 UNIT/ML ~~LOC~~ SOLN
0.0000 [IU] | Freq: Three times a day (TID) | SUBCUTANEOUS | Status: DC
  Administered 2020-01-05 – 2020-01-06 (×2): 3 [IU] via SUBCUTANEOUS
  Administered 2020-01-07: 2 [IU] via SUBCUTANEOUS
  Administered 2020-01-08 – 2020-01-09 (×2): 3 [IU] via SUBCUTANEOUS
  Administered 2020-01-09: 2 [IU] via SUBCUTANEOUS
  Administered 2020-01-09 – 2020-01-10 (×3): 5 [IU] via SUBCUTANEOUS
  Administered 2020-01-11: 3 [IU] via SUBCUTANEOUS
  Administered 2020-01-11: 5 [IU] via SUBCUTANEOUS
  Administered 2020-01-12 – 2020-01-13 (×3): 3 [IU] via SUBCUTANEOUS

## 2020-01-05 MED ORDER — GADOBUTROL 1 MMOL/ML IV SOLN
10.0000 mL | Freq: Once | INTRAVENOUS | Status: AC | PRN
  Administered 2020-01-05: 10 mL via INTRAVENOUS

## 2020-01-05 MED ORDER — LORAZEPAM 2 MG/ML IJ SOLN: 1.0000 mg | Freq: Once | INTRAMUSCULAR | Status: AC

## 2020-01-05 MED ORDER — SENNOSIDES-DOCUSATE SODIUM 8.6-50 MG PO TABS: 1.0000 | ORAL_TABLET | Freq: Every evening | ORAL | Status: DC | PRN

## 2020-01-05 MED ORDER — ONDANSETRON HCL 4 MG/2ML IJ SOLN: 4.0000 mg | Freq: Four times a day (QID) | INTRAMUSCULAR | Status: DC | PRN

## 2020-01-05 MED ORDER — ENOXAPARIN SODIUM 40 MG/0.4ML ~~LOC~~ SOLN
40.0000 mg | Freq: Every day | SUBCUTANEOUS | Status: DC
  Administered 2020-01-05 – 2020-01-13 (×7): 40 mg via SUBCUTANEOUS
  Filled 2020-01-05 (×9): qty 0.4

## 2020-01-05 MED ORDER — PIPERACILLIN-TAZOBACTAM 3.375 G IVPB
3.3750 g | Freq: Three times a day (TID) | INTRAVENOUS | Status: DC
  Administered 2020-01-05 – 2020-01-08 (×10): 3.375 g via INTRAVENOUS
  Filled 2020-01-05 (×11): qty 50

## 2020-01-05 MED ORDER — LACTATED RINGERS IV SOLN: INTRAVENOUS | Status: DC

## 2020-01-05 MED ORDER — INSULIN ASPART 100 UNIT/ML ~~LOC~~ SOLN
0.0000 [IU] | Freq: Every day | SUBCUTANEOUS | Status: DC
  Administered 2020-01-08: 2 [IU] via SUBCUTANEOUS

## 2020-01-05 MED ORDER — OXYCODONE-ACETAMINOPHEN 5-325 MG PO TABS
1.0000 | ORAL_TABLET | Freq: Four times a day (QID) | ORAL | Status: DC | PRN
  Administered 2020-01-05 – 2020-01-10 (×15): 1 via ORAL
  Filled 2020-01-05 (×15): qty 1

## 2020-01-05 NOTE — ED Notes (Signed)
RN notified MD of pt's c/o worsening pain in her infected leg wound

## 2020-01-05 NOTE — Progress Notes (Signed)
PROGRESS NOTE    Ketzia Friede  [id number] DOB: May 22, 1985 DOA: 01/04/2020 PCP: Patient, No Pcp Per    Brief Narrative:  35 year old female with history of type 2 diabetes on oral hypoglycemics, pyoderma gangrenosum and chronic left leg wound with polymicrobial infection presents for evaluation of fever, worsening leg pain and purulent drainage.  Unfortunately she had worsening left leg wound for last 3 years, poor outpatient support and follow-up, multiple previous interventions including hydrotherapy and last seen at wound care clinic 1 week ago presents to the ER because it is started smelling foul and draining purulent.  Reported temperature 102 at home. At the emergency room, afebrile but tachycardic.  Lactic acid 3.1.  Patient also on Metformin at home.  Blood pressure stable.  Treated with IV fluids and antibiotics with sepsis protocol and admitted to the hospital.  Assessment & Plan:   Principal Problem:   Chronic osteomyelitis involving lower leg, left (HCC) Active Problems:   Wound infection   Diabetes mellitus type 2 in obese (HCC)   Obesity, Class III, BMI 40-49.9 (morbid obesity) (HCC)   Chronic osteomyelitis of left lower leg (HCC)  Acute on chronic soft tissue infection with open wound of the left leg: MRI with no evidence of osteomyelitis, no clinical evidence of underlying collection or abscess. Clinically with worsening drainage from the tendons and soft tissue. Blood cultures drawn.  Will collect local culture. Previous history of polymicrobial infection including Enterococcus faecalis and Pseudomonas.  Continue vancomycin and Zosyn. Local wound care. Will discuss with infectious disease for management of this complicated wound. Case management consult, outpatient support and resources needed.  Type 2 diabetes: On oral hypoglycemics at home.  Well controlled as per patient.  Currently remains on sliding scale insulin.  Hypokalemia: Replaced.  We will check  magnesium with the morning labs.   DVT prophylaxis: enoxaparin (LOVENOX) injection 40 mg Start: 01/05/20 1000   Code Status: Full code Family Communication: None Disposition Plan: Status is: Inpatient  Remains inpatient appropriate because:Inpatient level of care appropriate due to severity of illness   Dispo: The patient is from: Home              Anticipated d/c is to: Home              Anticipated d/c date is: 3 days              Patient currently is not medically stable to d/c.         Consultants:   Infectious disease  Procedures:   None  Antimicrobials:  Antibiotics Given (last 72 hours)    Date/Time Action Medication Dose Rate   01/04/20 2145 New Bag/Given   cefTRIAXone (ROCEPHIN) 2 g in sodium chloride 0.9 % 100 mL IVPB 2 g 200 mL/hr   01/04/20 2227 New Bag/Given   vancomycin (VANCOREADY) IVPB 2000 mg/400 mL 2,000 mg 200 mL/hr   01/05/20 0915 New Bag/Given   vancomycin (VANCOREADY) IVPB 1750 mg/350 mL 1,750 mg 175 mL/hr   01/05/20 1130 New Bag/Given  [cannot infuse at the same time as vancomycin per pharm. difficult IV access]   piperacillin-tazobactam (ZOSYN) IVPB 3.375 g 3.375 g 12.5 mL/hr         Subjective: Patient seen and examined.  Tearful with pain and suffering for last 3 years.  Remains afebrile.  Is still in the ER waiting for inpatient bed assignment.  Objective: Vitals:   01/05/20 1100 01/05/20 1115 01/05/20 1130 01/05/20 1145  BP: 114/72 (!) 118/105 133/87  92/74  Pulse: 70 73 74 77  Resp: 13 18 19 16   Temp:      TempSrc:      SpO2: (!) 88% 100% 99% 99%  Weight:      Height:        Intake/Output Summary (Last 24 hours) at 01/05/2020 1243 Last data filed at 01/05/2020 1152 Gross per 24 hour  Intake 350 ml  Output 600 ml  Net -250 ml   Filed Weights   01/04/20 1908 01/04/20 2228  Weight: 123.8 kg 123.8 kg    Examination:  General exam: Appears calm and comfortable  But anxious and tearful. Respiratory system: Clear to  auscultation. Respiratory effort normal. Cardiovascular system: S1 & S2 heard, RRR. No JVD, murmurs, rubs, gallops or clicks.  Gastrointestinal system: Abdomen is nondistended, soft and nontender. No organomegaly or masses felt. Normal bowel sounds heard. Central nervous system: Alert and oriented. No focal neurological deficits. Extremities:    Purulent circumferential open wound with tendons exposed.  Erythematous margins.   Data Reviewed: I have personally reviewed following labs and imaging studies  CBC: Recent Labs  Lab 01/04/20 1746 01/05/20 0111 01/05/20 0525  WBC 11.1* 10.2 6.9  NEUTROABS 6.6  --   --   HGB 9.6* 9.1* 10.5*  HCT 33.4* 31.0* 35.2*  MCV 79.3* 77.7* 77.7*  PLT 364 357 [id number]   Basic Metabolic Panel: Recent Labs  Lab 01/04/20 1746 01/05/20 0111 01/05/20 0525  NA 138  --  139  K 3.1*  --  3.2*  CL 106  --  108  CO2 18*  --  19*  GLUCOSE 179*  --  120*  BUN 7  --  7  CREATININE 0.89 0.75 0.76  CALCIUM 8.9  --  8.2*   GFR: Estimated Creatinine Clearance: 134.9 mL/min (by C-G formula based on SCr of 0.76 mg/dL). Liver Function Tests: Recent Labs  Lab 01/04/20 1746  AST 24  ALT 41  ALKPHOS 133*  BILITOT 0.5  PROT 7.0  ALBUMIN 3.1*   No results for input(s): LIPASE, AMYLASE in the last 168 hours. No results for input(s): AMMONIA in the last 168 hours. Coagulation Profile: Recent Labs  Lab 01/04/20 1746  INR 1.1   Cardiac Enzymes: No results for input(s): CKTOTAL, CKMB, CKMBINDEX, TROPONINI in the last 168 hours. BNP (last 3 results) No results for input(s): PROBNP in the last 8760 hours. HbA1C: Recent Labs    01/05/20 0111  HGBA1C 6.7*   CBG: Recent Labs  Lab 01/04/20 1738 01/05/20 0102 01/05/20 0745  GLUCAP 166* 101* 108*   Lipid Profile: No results for input(s): CHOL, HDL, LDLCALC, TRIG, CHOLHDL, LDLDIRECT in the last 72 hours. Thyroid Function Tests: No results for input(s): TSH, T4TOTAL, FREET4, T3FREE, THYROIDAB in the  last 72 hours. Anemia Panel: No results for input(s): VITAMINB12, FOLATE, FERRITIN, TIBC, IRON, RETICCTPCT in the last 72 hours. Sepsis Labs: Recent Labs  Lab 01/04/20 1746 01/04/20 2052  LATICACIDVEN 3.1* 1.0    Recent Results (from the past 240 hour(s))  Culture, blood (Routine x 2)     Status: None (Preliminary result)   Collection Time: 01/04/20  5:47 PM   Specimen: BLOOD  Result Value Ref Range Status   Specimen Description BLOOD SITE NOT SPECIFIED  Final   Special Requests   Final    BOTTLES DRAWN AEROBIC AND ANAEROBIC Blood Culture adequate volume   Culture   Final    NO GROWTH < 24 HOURS Performed at [facility], [address] [place], [place] [address]    Report Status PENDING  Incomplete  Culture, blood (Routine x 2)     Status: None (Preliminary result)   Collection Time: 01/04/20  8:52 PM   Specimen: BLOOD  Result Value Ref Range Status   Specimen Description BLOOD LEFT ANTECUBITAL  Final   Special Requests   Final    BOTTLES DRAWN AEROBIC AND ANAEROBIC Blood Culture adequate volume   Culture   Final    NO GROWTH < 12 HOURS Performed at [facility], [address] [place], [place] [address]    Report Status PENDING  Incomplete  SARS CORONAVIRUS 2 (TAT 6-24 HRS) Nasopharyngeal Nasopharyngeal Swab     Status: None   Collection Time: 01/04/20  9:56 PM   Specimen: Nasopharyngeal Swab  Result Value Ref Range Status   SARS Coronavirus 2 NEGATIVE NEGATIVE Final    Comment: (NOTE) SARS-CoV-2 target nucleic acids are NOT DETECTED.  The SARS-CoV-2 RNA is generally detectable in upper and lower respiratory specimens during the acute phase of infection. Negative results do not preclude SARS-CoV-2 infection, do not rule out co-infections with other pathogens, and should not be used as the sole basis for treatment or other patient management decisions. Negative results must be combined with clinical observations, patient history, and epidemiological  information. The expected result is Negative.  Fact Sheet for Patients: HairSlick.no  Fact Sheet for Healthcare Providers: quierodirigir.com  This test is not yet approved or cleared by the [place] FDA and  has been authorized for detection and/or diagnosis of SARS-CoV-2 by FDA under an Emergency Use Authorization (EUA). This EUA will remain  in effect (meaning this test can be used) for the duration of the COVID-19 declaration under Se ction 564(b)(1) of the Act, 21 U.S.C. section 360bbb-3(b)(1), unless the authorization is terminated or revoked sooner.  Performed at [facility], [address], [place], [place] [address]          Radiology Studies: DG Tibia/Fibula Left  Result Date: 01/04/2020 CLINICAL DATA:  Left leg wound, increased drainage, foul odor EXAM: LEFT TIBIA AND FIBULA - 2 VIEW COMPARISON:  01/16/2017 FINDINGS: The soft tissue defect at the level of the distal diaphysis of the tibia and fibula has markedly increased in size in keeping with interval surgical debridement. There is extensive subcutaneous edema within the left lower extremity more proximal to the area of debris mint. There is developing exuberant periosteal reaction involving the anterior cortex of the distal tibia and medial cortex of the a fibula in the region of debridement suspicious for chronic osteomyelitis or, less likely, reactive hyperostosis. No associated fracture or dislocation. IMPRESSION: Interval debridement with enlarging surgical defect. Developing exuberant periosteal reaction suspicious for changes of chronic osteomyelitis involving the distal tibia and fibula diaphyses. This could be better assessed with contrast enhanced MRI examination. Electronically Signed   By: Helyn Numbers MD   On: 01/04/2020 20:28   MR TIBIA FIBULA LEFT W WO CONTRAST  Result Date: 01/05/2020 CLINICAL DATA:  Diabetic with chronic leg wound. History  of pyoderma gangrenosum. Open wound lower extremity. EXAM: MRI OF LOWER LEFT EXTREMITY WITHOUT AND WITH CONTRAST TECHNIQUE: Multiplanar, multisequence MR imaging of the left lower extremity was performed both before and after administration of intravenous contrast. CONTRAST:  74mL GADAVIST GADOBUTROL 1 MMOL/ML IV SOLN COMPARISON:  01/16/2017 FINDINGS: Extensive soft tissue defect involving almost entire circumference of the left lower extremity distally. Some surrounding enhancing granulation tissue and subcutaneous soft  tissue swelling/edema suggesting cellulitis. No findings for drainable soft soft tissue abscess. Underlying myositis mainly involving the anterior tibialis and peroneal muscles with inflammation/edema and enhancement but no findings to suggest pyomyositis. Distal cortical thickening of the tibia likely chronic periosteal reaction but no MR findings to suggest osteomyelitis. No evidence of septic arthritis at the ankle or knee joints. IMPRESSION: 1. Chronic extensive soft tissue defect involving almost entire circumference of the left lower extremity distally. 2. Mild diffuse cellulitis but no soft tissue abscess. 3. Myositis mainly involving the anterior tibialis and peroneal muscles but no findings for pyomyositis. 4. No findings for septic arthritis or osteomyelitis. Electronically Signed   By: Marijo Sanes M.D.   On: 01/05/2020 09:10        Scheduled Meds: . enoxaparin (LOVENOX) injection  40 mg Subcutaneous Daily  . insulin aspart  0-15 Units Subcutaneous TID WC  . insulin aspart  0-5 Units Subcutaneous QHS  . potassium chloride  40 mEq Oral BID   Continuous Infusions: . lactated ringers Stopped (01/04/20 2002)  . lactated ringers 150 mL/hr at 01/05/20 0751  . lactated ringers Stopped (01/05/20 0429)  . piperacillin-tazobactam (ZOSYN)  IV 3.375 g (01/05/20 1130)  . vancomycin Stopped (01/05/20 1152)     LOS: 1 day    Time spent: 35 minutes    Barb Merino, MD [facility] Pager [phone]

## 2020-01-05 NOTE — Progress Notes (Signed)
Elink Code Sepsis Completion Note:  Pt had BC drawn before ABX was started, after access was achieved. Only received approx 1/2 L of IVF, not the allotted 3,714 ml per body weight, but LA came down from 3.1 to 1.0. Pt being admitted to med surgical unit for further monitoring.   Chloe Flis eLink RN

## 2020-01-05 NOTE — ED Notes (Signed)
Patient transported to MRI 

## 2020-01-05 NOTE — Plan of Care (Signed)
  Problem: Education: Goal: Knowledge of General Education information will improve Description: Including pain rating scale, medication(s)/side effects and non-pharmacologic comfort measures Outcome: Progressing   Problem: Activity: Goal: Risk for activity intolerance will decrease Outcome: Progressing   Problem: Nutrition: Goal: Adequate nutrition will be maintained Outcome: Progressing   Problem: Coping: Goal: Level of anxiety will decrease Outcome: Progressing   Problem: Elimination: Goal: Will not experience complications related to urinary retention Outcome: Progressing   

## 2020-01-05 NOTE — ED Notes (Signed)
MS Breakfast Ordered 

## 2020-01-05 NOTE — ED Notes (Signed)
Dr. Rachael Darby spoke to this RN on the phone, saying that pt may have 1mg  ativan IV for MRI claustrophobia.

## 2020-01-05 NOTE — Consult Note (Addendum)
[facility] for Infectious Disease    Date of Admission:  01/04/2020   Total days of antibiotics: 0 (vanco/zosyn)               Reason for Consult: Pyoderma gangrenosum    Referring Provider: Ghimire   Assessment: Pyoderma gangrenosum DM2 (A1C 6.7% 01-05-20) Hep C ab+  Plan: 1. Vanco/zosyn will cover her adequately for now.   2. WOC eval 3. Surgical eval 4. Derm eval (transfer to derm center?) 5. BCx pending 6. Wound swab Cx pending 7. Will check Hep C RNA.   Comment Her MRI shows myositis and cellulitis.  This will be difficult to heal without a clear etiology- she states it is from her bodies immune system rejecting itself.  She was seen at [facility] (09-2018) and was ordered humira but it was denied by her insurance. Per derm, ID cleared her to start humira (with regards to her Hep C). She has had some GI issues, query if she has Crohn's , UC? Her mother has Crohn's.   Thank you so much for this interesting consult,  Principal Problem:   Chronic osteomyelitis involving lower leg, left ([place]) Active Problems:   Wound infection   Diabetes mellitus type 2 in obese (H[facility])   Obesity, Class III, BMI 40-49.9 (morbid obesity) ([place])   Chronic osteomyelitis of left lower leg ([place])   . enoxaparin (LOVENOX) injection  40 mg Subcutaneous Daily  . insulin aspart  0-15 Units Subcutaneous TID WC  . insulin aspart  0-5 Units Subcutaneous QHS  . potassium chloride  40 mEq Oral BID    HPI: Azha Schermerhorn is a 35 y.o. female with hx of P gangrensoum for last 3 years involving her LLE. She has been followed at [facility] (Derm, prev ID). She was being followed at [facility] until her insurance no longer covered in July 2021. Since Dec 2021, she has been followed at [facility] (Dr Dellia Nims). She has prev had an unaboot.  Over the last month she has had worsening pain. Over the last week has had nausea, fatigue, fever (102), and worsening odor from her wound.   Dx with Dm2 1 year (multiple  steroid courses).    Review of Systems: Review of Systems  Constitutional: Positive for fever, malaise/fatigue and weight loss. Negative for chills.  Respiratory: Negative for shortness of breath.   Gastrointestinal: Positive for constipation. Negative for diarrhea.  Genitourinary: Negative for dysuria.  Neurological: Negative for sensory change.  All other systems reviewed and are negative.   Past Medical History:  Diagnosis Date  . Diabetes mellitus without complication ([place])    with pregnancy    Social History   Tobacco Use  . Smoking status: Current Some Day Smoker    Packs/day: 0.50    Types: Cigarettes  . Smokeless tobacco: Never Used  Substance Use Topics  . Alcohol use: No  . Drug use: No    History reviewed. No pertinent family history.   Medications:  Scheduled: . enoxaparin (LOVENOX) injection  40 mg Subcutaneous Daily  . insulin aspart  0-15 Units Subcutaneous TID WC  . insulin aspart  0-5 Units Subcutaneous QHS  . potassium chloride  40 mEq Oral BID    Abtx:  Anti-infectives (From admission, onward)   Start     Dose/Rate Route Frequency Ordered Stop   01/05/20 0830  vancomycin (VANCOREADY) IVPB 1750 mg/350 mL        1,750 mg 175 mL/hr over  120 Minutes Intravenous Every 12 hours 01/04/20 1929     01/05/20 0815  piperacillin-tazobactam (ZOSYN) IVPB 3.375 g        3.375 g 12.5 mL/hr over 240 Minutes Intravenous Every 8 hours 01/05/20 0811     01/04/20 1915  vancomycin (VANCOREADY) IVPB 2000 mg/400 mL        2,000 mg 200 mL/hr over 120 Minutes Intravenous  Once 01/04/20 1903 01/05/20 0049   01/04/20 1900  vancomycin (VANCOCIN) IVPB 1000 mg/200 mL premix  Status:  Discontinued        1,000 mg 200 mL/hr over 60 Minutes Intravenous  Once 01/04/20 1851 01/04/20 1903   01/04/20 1900  cefTRIAXone (ROCEPHIN) 2 g in sodium chloride 0.9 % 100 mL IVPB        2 g 200 mL/hr over 30 Minutes Intravenous  Once 01/04/20 1851 01/04/20 2224         OBJECTIVE: Blood pressure 92/74, pulse 77, temperature 98.1 F (36.7 C), temperature source Oral, resp. rate 16, height 5\' 8"  (1.727 m), weight 123.8 kg, SpO2 99 %.  Physical Exam Vitals reviewed.  Constitutional:      Appearance: Normal appearance. She is obese.  HENT:     Mouth/Throat:     Mouth: Mucous membranes are moist.     Pharynx: No oropharyngeal exudate.  Eyes:     Extraocular Movements: Extraocular movements intact.     Pupils: Pupils are equal, round, and reactive to light.  Cardiovascular:     Rate and Rhythm: Normal rate and regular rhythm.     Pulses:          Dorsalis pedis pulses are 3+ on the right side and 3+ on the left side.  Pulmonary:     Effort: Pulmonary effort is normal.     Breath sounds: Normal breath sounds.  Abdominal:     General: Bowel sounds are normal. There is no distension.     Palpations: Abdomen is soft.     Tenderness: There is no abdominal tenderness.  Musculoskeletal:     Cervical back: Normal range of motion and neck supple.  Skin:      Neurological:     General: No focal deficit present.     Mental Status: She is alert.     Sensory: No sensory deficit.   please see images.   Lab Results Results for orders placed or performed during the hospital encounter of 01/04/20 (from the past 48 hour(s))  CBG monitoring, ED     Status: Abnormal   Collection Time: 01/04/20  5:38 PM  Result Value Ref Range   Glucose-Capillary 166 (H) 70 - 99 mg/dL    Comment: Glucose reference range applies only to samples taken after fasting for at least 8 hours.   Comment 1 Notify RN   Comprehensive metabolic panel     Status: Abnormal   Collection Time: 01/04/20  5:46 PM  Result Value Ref Range   Sodium 138 135 - 145 mmol/L   Potassium 3.1 (L) 3.5 - 5.1 mmol/L   Chloride 106 98 - 111 mmol/L   CO2 18 (L) 22 - 32 mmol/L   Glucose, Bld 179 (H) 70 - 99 mg/dL    Comment: Glucose reference range applies only to samples taken after fasting for  at least 8 hours.   BUN 7 6 - 20 mg/dL   Creatinine, Ser 03/03/20 0.44 - 1.00 mg/dL   Calcium 8.9 8.9 - 4.17 mg/dL   Total Protein 7.0 6.5 - 8.1  g/dL   Albumin 3.1 (L) 3.5 - 5.0 g/dL   AST 24 15 - 41 U/L   ALT 41 0 - 44 U/L   Alkaline Phosphatase 133 (H) 38 - 126 U/L   Total Bilirubin 0.5 0.3 - 1.2 mg/dL   GFR, Estimated >60 >60 mL/min    Comment: (NOTE) Calculated using the CKD-EPI Creatinine Equation (2021)    Anion gap 14 5 - 15    Comment: Performed at [facility] [address] [place], [place] [address]  Lactic acid, plasma     Status: Abnormal   Collection Time: 01/04/20  5:46 PM  Result Value Ref Range   Lactic Acid, Venous 3.1 (HH) 0.5 - 1.9 mmol/L    Comment: CRITICAL RESULT CALLED TO, READ BACK BY AND VERIFIED WITH: K.COBB,RN @1843  01/04/2020 VANG.J Performed at [facility], [place] [address], [place], [place] [address]   CBC with Differential     Status: Abnormal   Collection Time: 01/04/20  5:46 PM  Result Value Ref Range   WBC 11.1 (H) 4.0 - 10.5 K/uL   RBC 4.21 3.87 - 5.11 MIL/uL   Hemoglobin 9.6 (L) 12.0 - 15.0 g/dL   HCT 33.4 (L) 36.0 - 46.0 %   MCV 79.3 (L) 80.0 - 100.0 fL   MCH 22.8 (L) 26.0 - 34.0 pg   MCHC 28.7 (L) 30.0 - 36.0 g/dL   RDW 18.7 (H) 11.5 - 15.5 %   Platelets 364 150 - 400 K/uL   nRBC 0.0 0.0 - 0.2 %   Neutrophils Relative % 58 %   Neutro Abs 6.6 1.7 - 7.7 K/uL   Lymphocytes Relative 29 %   Lymphs Abs 3.2 0.7 - 4.0 K/uL   Monocytes Relative 7 %   Monocytes Absolute 0.8 0.1 - 1.0 K/uL   Eosinophils Relative 4 %   Eosinophils Absolute 0.4 0.0 - 0.5 K/uL   Basophils Relative 1 %   Basophils Absolute 0.1 0.0 - 0.1 K/uL   Immature Granulocytes 1 %   Abs Immature Granulocytes 0.08 (H) 0.00 - 0.07 K/uL    Comment: Performed at [facility], [place] [address], [place], [place] [address]  Protime-INR     Status: None   Collection Time: 01/04/20  5:46 PM  Result Value Ref Range   Prothrombin Time 13.4 11.4 - 15.2 seconds   INR  1.1 0.8 - 1.2    Comment: (NOTE) INR goal varies based on device and disease states. Performed at [facility], [place] [address], [place], [place] [address]   Culture, blood (Routine x 2)     Status: None (Preliminary result)   Collection Time: 01/04/20  5:47 PM   Specimen: BLOOD  Result Value Ref Range   Specimen Description BLOOD SITE NOT SPECIFIED    Special Requests      BOTTLES DRAWN AEROBIC AND ANAEROBIC Blood Culture adequate volume   Culture      NO GROWTH < 24 HOURS Performed at [facility], [place] [address], [place], [place] [address]    Report Status PENDING   I-Stat beta hCG blood, ED     Status: None   Collection Time: 01/04/20  6:03 PM  Result Value Ref Range   I-stat hCG, quantitative <5.0 <5 mIU/mL   Comment 3            Comment:   GEST. AGE      CONC.  (mIU/mL)   <=1 WEEK  5 - 50     2 WEEKS       50 - 500     3 WEEKS       100 - 10,000     4 WEEKS     1,000 - 30,000        FEMALE AND NON-PREGNANT FEMALE:     LESS THAN 5 mIU/mL   Lactic acid, plasma     Status: None   Collection Time: 01/04/20  8:52 PM  Result Value Ref Range   Lactic Acid, Venous 1.0 0.5 - 1.9 mmol/L    Comment: Performed at [facility], [place] [address] [place], [place] [address]  Culture, blood (Routine x 2)     Status: None (Preliminary result)   Collection Time: 01/04/20  8:52 PM   Specimen: BLOOD  Result Value Ref Range   Specimen Description BLOOD LEFT ANTECUBITAL    Special Requests      BOTTLES DRAWN AEROBIC AND ANAEROBIC Blood Culture adequate volume   Culture      NO GROWTH < 12 HOURS Performed at [facility], [place] [address] [place], [place] [address]    Report Status PENDING   SARS CORONAVIRUS 2 (TAT 6-24 HRS) Nasopharyngeal Nasopharyngeal Swab     Status: None   Collection Time: 01/04/20  9:56 PM   Specimen: Nasopharyngeal Swab  Result Value Ref Range   SARS Coronavirus 2 NEGATIVE NEGATIVE    Comment: (NOTE) SARS-CoV-2 target nucleic acids  are NOT DETECTED.  The SARS-CoV-2 RNA is generally detectable in upper and lower respiratory specimens during the acute phase of infection. Negative results do not preclude SARS-CoV-2 infection, do not rule out co-infections with other pathogens, and should not be used as the sole basis for treatment or other patient management decisions. Negative results must be combined with clinical observations, patient history, and epidemiological information. The expected result is Negative.  Fact Sheet for Patients: SugarRoll.be  Fact Sheet for Healthcare Providers: https://www.woods-mathews.com/  This test is not yet approved or cleared by the [place] FDA and  has been authorized for detection and/or diagnosis of SARS-CoV-2 by FDA under an Emergency Use Authorization (EUA). This EUA will remain  in effect (meaning this test can be used) for the duration of the COVID-19 declaration under Se ction 564(b)(1) of the Act, 21 U.S.C. section 360bbb-3(b)(1), unless the authorization is terminated or revoked sooner.  Performed at [facility], [place] [address], [place], [place] [address]   Urinalysis, Routine w reflex microscopic Urine, Clean Catch     Status: None   Collection Time: 01/04/20 10:33 PM  Result Value Ref Range   Color, Urine YELLOW YELLOW   APPearance CLEAR CLEAR   Specific Gravity, Urine 1.013 1.005 - 1.030   pH 6.0 5.0 - 8.0   Glucose, UA NEGATIVE NEGATIVE mg/dL   Hgb urine dipstick NEGATIVE NEGATIVE   Bilirubin Urine NEGATIVE NEGATIVE   Ketones, ur NEGATIVE NEGATIVE mg/dL   Protein, ur NEGATIVE NEGATIVE mg/dL   Nitrite NEGATIVE NEGATIVE   Leukocytes,Ua NEGATIVE NEGATIVE    Comment: Performed at [facility] [address] [place], [place] [address]  CBG monitoring, ED     Status: Abnormal   Collection Time: 01/05/20  1:02 AM  Result Value Ref Range   Glucose-Capillary 101 (H) 70 - 99 mg/dL    Comment: Glucose  reference range applies only to samples taken after fasting for at least 8 hours.  HIV Antibody (routine testing  w rflx)     Status: None   Collection Time: 01/05/20  1:11 AM  Result Value Ref Range   HIV Screen 4th Generation wRfx Non Reactive Non Reactive    Comment: Performed at [facility], [place] [address], [place], [place] [address]  CBC     Status: Abnormal   Collection Time: 01/05/20  1:11 AM  Result Value Ref Range   WBC 10.2 4.0 - 10.5 K/uL   RBC 3.99 3.87 - 5.11 MIL/uL   Hemoglobin 9.1 (L) 12.0 - 15.0 g/dL   HCT 31.0 (L) 36.0 - 46.0 %   MCV 77.7 (L) 80.0 - 100.0 fL   MCH 22.8 (L) 26.0 - 34.0 pg   MCHC 29.4 (L) 30.0 - 36.0 g/dL   RDW 18.6 (H) 11.5 - 15.5 %   Platelets 357 150 - 400 K/uL   nRBC 0.0 0.0 - 0.2 %    Comment: Performed at [facility], [place] [address] [place], [place] [address]  Creatinine, serum     Status: None   Collection Time: 01/05/20  1:11 AM  Result Value Ref Range   Creatinine, Ser 0.75 0.44 - 1.00 mg/dL   GFR, Estimated >60 >60 mL/min    Comment: (NOTE) Calculated using the CKD-EPI Creatinine Equation (2021) Performed at [facility] [address] [place], [place] [address]   Hemoglobin A1c     Status: Abnormal   Collection Time: 01/05/20  1:11 AM  Result Value Ref Range   Hgb A1c MFr Bld 6.7 (H) 4.8 - 5.6 %    Comment: (NOTE) Pre diabetes:          5.7%-6.4%  Diabetes:              >6.4%  Glycemic control for   <7.0% adults with diabetes    Mean Plasma Glucose 145.59 mg/dL    Comment: Performed at [facility] [address], [place], [place] [id number]  Basic metabolic panel     Status: Abnormal   Collection Time: 01/05/20  5:25 AM  Result Value Ref Range   Sodium 139 135 - 145 mmol/L   Potassium 3.2 (L) 3.5 - 5.1 mmol/L   Chloride 108 98 - 111 mmol/L   CO2 19 (L) 22 - 32 mmol/L   Glucose, Bld 120 (H) 70 - 99 mg/dL    Comment: Glucose reference range applies only to samples taken after fasting for at least  8 hours.   BUN 7 6 - 20 mg/dL   Creatinine, Ser 0.76 0.44 - 1.00 mg/dL   Calcium 8.2 (L) 8.9 - 10.3 mg/dL   GFR, Estimated >60 >60 mL/min    Comment: (NOTE) Calculated using the CKD-EPI Creatinine Equation (2021)    Anion gap 12 5 - 15    Comment: Performed at [facility] [address] [place], [place] [address]  CBC     Status: Abnormal   Collection Time: 01/05/20  5:25 AM  Result Value Ref Range   WBC 6.9 4.0 - 10.5 K/uL   RBC 4.53 3.87 - 5.11 MIL/uL   Hemoglobin 10.5 (L) 12.0 - 15.0 g/dL   HCT 35.2 (L) 36.0 - 46.0 %   MCV 77.7 (L) 80.0 - 100.0 fL   MCH 23.2 (L) 26.0 - 34.0 pg   MCHC 29.8 (L) 30.0 - 36.0 g/dL   RDW 18.6 (H) 11.5 - 15.5 %   Platelets 283 150 - 400 K/uL    Comment:  Performed at [facility], [place] [address], [place], [place] [address]  CBG monitoring, ED     Status: Abnormal   Collection Time: 01/05/20  7:45 AM  Result Value Ref Range   Glucose-Capillary 108 (H) 70 - 99 mg/dL    Comment: Glucose reference range applies only to samples taken after fasting for at least 8 hours.   Comment 1 Notify RN    Comment 2 Document in Chart       Component Value Date/Time   SDES BLOOD LEFT ANTECUBITAL 01/04/2020 2052   SPECREQUEST  01/04/2020 2052    BOTTLES DRAWN AEROBIC AND ANAEROBIC Blood Culture adequate volume   CULT  01/04/2020 2052    NO GROWTH < 12 HOURS Performed at [facility] [address], [place], [place] [address]    REPTSTATUS PENDING 01/04/2020 2052   DG Tibia/Fibula Left  Result Date: 01/04/2020 CLINICAL DATA:  Left leg wound, increased drainage, foul odor EXAM: LEFT TIBIA AND FIBULA - 2 VIEW COMPARISON:  01/16/2017 FINDINGS: The soft tissue defect at the level of the distal diaphysis of the tibia and fibula has markedly increased in size in keeping with interval surgical debridement. There is extensive subcutaneous edema within the left lower extremity more proximal to the area of debris mint. There is developing exuberant  periosteal reaction involving the anterior cortex of the distal tibia and medial cortex of the a fibula in the region of debridement suspicious for chronic osteomyelitis or, less likely, reactive hyperostosis. No associated fracture or dislocation. IMPRESSION: Interval debridement with enlarging surgical defect. Developing exuberant periosteal reaction suspicious for changes of chronic osteomyelitis involving the distal tibia and fibula diaphyses. This could be better assessed with contrast enhanced MRI examination. Electronically Signed   By: Fidela Salisbury MD   On: 01/04/2020 20:28   MR TIBIA FIBULA LEFT W WO CONTRAST  Result Date: 01/05/2020 CLINICAL DATA:  Diabetic with chronic leg wound. History of pyoderma gangrenosum. Open wound lower extremity. EXAM: MRI OF LOWER LEFT EXTREMITY WITHOUT AND WITH CONTRAST TECHNIQUE: Multiplanar, multisequence MR imaging of the left lower extremity was performed both before and after administration of intravenous contrast. CONTRAST:  39mL GADAVIST GADOBUTROL 1 MMOL/ML IV SOLN COMPARISON:  01/16/2017 FINDINGS: Extensive soft tissue defect involving almost entire circumference of the left lower extremity distally. Some surrounding enhancing granulation tissue and subcutaneous soft tissue swelling/edema suggesting cellulitis. No findings for drainable soft soft tissue abscess. Underlying myositis mainly involving the anterior tibialis and peroneal muscles with inflammation/edema and enhancement but no findings to suggest pyomyositis. Distal cortical thickening of the tibia likely chronic periosteal reaction but no MR findings to suggest osteomyelitis. No evidence of septic arthritis at the ankle or knee joints. IMPRESSION: 1. Chronic extensive soft tissue defect involving almost entire circumference of the left lower extremity distally. 2. Mild diffuse cellulitis but no soft tissue abscess. 3. Myositis mainly involving the anterior tibialis and peroneal muscles but no findings  for pyomyositis. 4. No findings for septic arthritis or osteomyelitis. Electronically Signed   By: Marijo Sanes M.D.   On: 01/05/2020 09:10   Recent Results (from the past 240 hour(s))  Culture, blood (Routine x 2)     Status: None (Preliminary result)   Collection Time: 01/04/20  5:47 PM   Specimen: BLOOD  Result Value Ref Range Status   Specimen Description BLOOD SITE NOT SPECIFIED  Final   Special Requests   Final    BOTTLES DRAWN AEROBIC AND ANAEROBIC Blood Culture adequate volume  Culture   Final    NO GROWTH < 24 HOURS Performed at [facility], [place] [address] [place], [place] [address]    Report Status PENDING  Incomplete  Culture, blood (Routine x 2)     Status: None (Preliminary result)   Collection Time: 01/04/20  8:52 PM   Specimen: BLOOD  Result Value Ref Range Status   Specimen Description BLOOD LEFT ANTECUBITAL  Final   Special Requests   Final    BOTTLES DRAWN AEROBIC AND ANAEROBIC Blood Culture adequate volume   Culture   Final    NO GROWTH < 12 HOURS Performed at [facility], [place] [address] [place], [place] [address]    Report Status PENDING  Incomplete  SARS CORONAVIRUS 2 (TAT 6-24 HRS) Nasopharyngeal Nasopharyngeal Swab     Status: None   Collection Time: 01/04/20  9:56 PM   Specimen: Nasopharyngeal Swab  Result Value Ref Range Status   SARS Coronavirus 2 NEGATIVE NEGATIVE Final    Comment: (NOTE) SARS-CoV-2 target nucleic acids are NOT DETECTED.  The SARS-CoV-2 RNA is generally detectable in upper and lower respiratory specimens during the acute phase of infection. Negative results do not preclude SARS-CoV-2 infection, do not rule out co-infections with other pathogens, and should not be used as the sole basis for treatment or other patient management decisions. Negative results must be combined with clinical observations, patient history, and epidemiological information. The expected result is Negative.  Fact Sheet for  Patients: SugarRoll.be  Fact Sheet for Healthcare Providers: https://www.woods-mathews.com/  This test is not yet approved or cleared by the [place] FDA and  has been authorized for detection and/or diagnosis of SARS-CoV-2 by FDA under an Emergency Use Authorization (EUA). This EUA will remain  in effect (meaning this test can be used) for the duration of the COVID-19 declaration under Se ction 564(b)(1) of the Act, 21 U.S.C. section 360bbb-3(b)(1), unless the authorization is terminated or revoked sooner.  Performed at [facility], [place] [address] [place], [place] [address]     Microbiology: Recent Results (from the past 240 hour(s))  Culture, blood (Routine x 2)     Status: None (Preliminary result)   Collection Time: 01/04/20  5:47 PM   Specimen: BLOOD  Result Value Ref Range Status   Specimen Description BLOOD SITE NOT SPECIFIED  Final   Special Requests   Final    BOTTLES DRAWN AEROBIC AND ANAEROBIC Blood Culture adequate volume   Culture   Final    NO GROWTH < 24 HOURS Performed at [facility], [address], [place], [place] [address]    Report Status PENDING  Incomplete  Culture, blood (Routine x 2)     Status: None (Preliminary result)   Collection Time: 01/04/20  8:52 PM   Specimen: BLOOD  Result Value Ref Range Status   Specimen Description BLOOD LEFT ANTECUBITAL  Final   Special Requests   Final    BOTTLES DRAWN AEROBIC AND ANAEROBIC Blood Culture adequate volume   Culture   Final    NO GROWTH < 12 HOURS Performed at [facility], [place] [address] [place], [place] [address]    Report Status PENDING  Incomplete  SARS CORONAVIRUS 2 (TAT 6-24 HRS) Nasopharyngeal Nasopharyngeal Swab     Status: None   Collection Time: 01/04/20  9:56 PM   Specimen: Nasopharyngeal Swab  Result Value Ref Range Status   SARS Coronavirus 2 NEGATIVE NEGATIVE Final  Comment: (NOTE) SARS-CoV-2 target nucleic acids  are NOT DETECTED.  The SARS-CoV-2 RNA is generally detectable in upper and lower respiratory specimens during the acute phase of infection. Negative results do not preclude SARS-CoV-2 infection, do not rule out co-infections with other pathogens, and should not be used as the sole basis for treatment or other patient management decisions. Negative results must be combined with clinical observations, patient history, and epidemiological information. The expected result is Negative.  Fact Sheet for Patients: SugarRoll.be  Fact Sheet for Healthcare Providers: https://www.woods-mathews.com/  This test is not yet approved or cleared by the [place] FDA and  has been authorized for detection and/or diagnosis of SARS-CoV-2 by FDA under an Emergency Use Authorization (EUA). This EUA will remain  in effect (meaning this test can be used) for the duration of the COVID-19 declaration under Se ction 564(b)(1) of the Act, 21 U.S.C. section 360bbb-3(b)(1), unless the authorization is terminated or revoked sooner.  Performed at [facility], [place] [address] [place], [place] [address]     Radiographs and labs were personally reviewed by me.   Bobby Rumpf, MD [facility] [phone] 01/05/2020, 1:23 PM

## 2020-01-05 NOTE — Consult Note (Addendum)
WOC Nurse Consult Note: Reason for Consult: Left LE full thickness, nearly circumferential wound.  Patient was seen by Dr. Laroy Apple on 12/10/2019 and 12/17/2019. Please see his note from the last encounter with patient's history and previous providers involved in the care of this patient and her chronic, nonhealing wound of several years duration (~3).  Wound type: infectious vs venous insufficiency vs immunocompromised host related Pressure Injury POA: N/A Measurement: nearly circumferential Wound bed:See photo provided to EMR by ED Provider upon intake Drainage (amount, consistency, odor) Moderate amount light yellow Periwound:erythematous Dressing procedure/placement/frequency: I will provide guidance for topical care per Dr. Jannetta Quint last encounter I.e., using a silver hydrofiber (Aquacel Advantage) topped with ABD pads and secured with Kerlix roll gauze and an ACE bandage. I will not order Unna's Boot therapy at this time as (per Dr. Jannetta Quint note), the patient removed it prior to her follow up Clinic appointment due to pain. I will continue his recommendation to have patient elevate the extremity above the level of her heart twice daily for 30 minutes.  As noted in Dr. Jannetta Quint history, the patient has been recently referred to and began a treatment plan by Dermatology at [facility]), but due to a change in her insurance status did not continue there. Previously, in 2019, the patient was treated by Plastics here in [place] (Dr. Clois Dupes) with debridement under anesthesia and placement of ACell.  NPWT has also been tried in the past, both interventions according to Dr. Leanord Hawking have not proven effective. She was also seen by Dermatology in [place], [place].  Most recent notes indicate that the etiology of the wound may be immunosuppressive, manifested as pyoderma gangrenosum. Socially, patient is not able to stand on her leg for any extended periods of time,  has unrelieved pain, has children at home and cannot work.  Treatment of the wound beyond the topical care guidance mentioned above exceeds the scope of WOC Nursing.   Recommendations are for Transition of Care (TOC) referral for assistance with social barriers to care and consideration of either continuing care by the outpatient [facility] (Dr. Leanord Hawking) or if possible and if barriers to care can be resolved, continuing care at [facility]. If you agree, please order/refer.  I have communicated the above to Dr. Jerral Ralph via Secure Chat.  WOC nursing team will not follow, but will remain available to this patient, the nursing and medical teams.  Please re-consult if needed. Thanks, Ladona Mow, MSN, RN, GNP, [place]  Pager# [phone]

## 2020-01-05 NOTE — ED Notes (Signed)
Patient resting in bed  No issues noted  LLE wound noted

## 2020-01-06 DIAGNOSIS — L88 Pyoderma gangrenosum: Secondary | ICD-10-CM | POA: Diagnosis not present

## 2020-01-06 DIAGNOSIS — L03116 Cellulitis of left lower limb: Secondary | ICD-10-CM

## 2020-01-06 DIAGNOSIS — T148XXA Other injury of unspecified body region, initial encounter: Secondary | ICD-10-CM | POA: Diagnosis not present

## 2020-01-06 DIAGNOSIS — M86662 Other chronic osteomyelitis, left tibia and fibula: Secondary | ICD-10-CM | POA: Diagnosis not present

## 2020-01-06 DIAGNOSIS — E119 Type 2 diabetes mellitus without complications: Secondary | ICD-10-CM | POA: Diagnosis not present

## 2020-01-06 LAB — PHOSPHORUS: Phosphorus: 3.1 mg/dL (ref 2.5–4.6)

## 2020-01-06 LAB — CBC WITH DIFFERENTIAL/PLATELET
Abs Immature Granulocytes: 0.03 10*3/uL (ref 0.00–0.07)
Basophils Absolute: 0.1 10*3/uL (ref 0.0–0.1)
Basophils Relative: 1 %
Eosinophils Absolute: 0.5 10*3/uL (ref 0.0–0.5)
Eosinophils Relative: 7 %
HCT: 27.4 % — ABNORMAL LOW (ref 36.0–46.0)
Hemoglobin: 8.1 g/dL — ABNORMAL LOW (ref 12.0–15.0)
Immature Granulocytes: 0 %
Lymphocytes Relative: 30 %
Lymphs Abs: 2.3 10*3/uL (ref 0.7–4.0)
MCH: 22.6 pg — ABNORMAL LOW (ref 26.0–34.0)
MCHC: 29.6 g/dL — ABNORMAL LOW (ref 30.0–36.0)
MCV: 76.5 fL — ABNORMAL LOW (ref 80.0–100.0)
Monocytes Absolute: 0.6 10*3/uL (ref 0.1–1.0)
Monocytes Relative: 8 %
Neutro Abs: 4.2 10*3/uL (ref 1.7–7.7)
Neutrophils Relative %: 54 %
Platelets: 325 10*3/uL (ref 150–400)
RBC: 3.58 MIL/uL — ABNORMAL LOW (ref 3.87–5.11)
RDW: 18.2 % — ABNORMAL HIGH (ref 11.5–15.5)
WBC: 7.7 10*3/uL (ref 4.0–10.5)
nRBC: 0 % (ref 0.0–0.2)

## 2020-01-06 LAB — GLUCOSE, CAPILLARY
Glucose-Capillary: 115 mg/dL — ABNORMAL HIGH (ref 70–99)
Glucose-Capillary: 118 mg/dL — ABNORMAL HIGH (ref 70–99)
Glucose-Capillary: 152 mg/dL — ABNORMAL HIGH (ref 70–99)
Glucose-Capillary: 133 mg/dL — ABNORMAL HIGH (ref 70–99)

## 2020-01-06 LAB — BASIC METABOLIC PANEL
Anion gap: 12 (ref 5–15)
BUN: 7 mg/dL (ref 6–20)
CO2: 18 mmol/L — ABNORMAL LOW (ref 22–32)
Calcium: 8.7 mg/dL — ABNORMAL LOW (ref 8.9–10.3)
Chloride: 109 mmol/L (ref 98–111)
Creatinine, Ser: 0.66 mg/dL (ref 0.44–1.00)
GFR, Estimated: >60 mL/min (ref >60)
Glucose, Bld: 112 mg/dL — ABNORMAL HIGH (ref 70–99)
Potassium: 4 mmol/L (ref 3.5–5.1)
Sodium: 139 mmol/L (ref 135–145)

## 2020-01-06 LAB — CK: Total CK: 50 U/L (ref 38–234)

## 2020-01-06 LAB — MAGNESIUM: Magnesium: 2 mg/dL (ref 1.7–2.4)

## 2020-01-06 MED ORDER — METHOCARBAMOL 500 MG PO TABS
500.0000 mg | ORAL_TABLET | Freq: Once | ORAL | Status: AC
  Administered 2020-01-06: 500 mg via ORAL
  Filled 2020-01-06: qty 1

## 2020-01-06 MED ORDER — MAGNESIUM OXIDE 400 (241.3 MG) MG PO TABS
400.0000 mg | ORAL_TABLET | Freq: Once | ORAL | Status: DC
  Filled 2020-01-06: qty 1

## 2020-01-06 MED ORDER — MAGNESIUM OXIDE 400 (241.3 MG) MG PO TABS
200.0000 mg | ORAL_TABLET | Freq: Two times a day (BID) | ORAL | Status: DC | PRN
  Administered 2020-01-06 – 2020-01-08 (×2): 200 mg via ORAL
  Filled 2020-01-06: qty 1

## 2020-01-06 MED ORDER — SODIUM CHLORIDE 0.9 % IV SOLN
500.0000 mg | Freq: Every day | INTRAVENOUS | Status: DC
  Administered 2020-01-06 – 2020-01-07 (×2): 500 mg via INTRAVENOUS
  Filled 2020-01-06 (×3): qty 10

## 2020-01-06 NOTE — Plan of Care (Addendum)
Patient in a lot of pain at shift change, patient was not able to tolerate full removal of existing dressing d/t wound drying out and gauze adhering to wound. Multiple attempts at removing old dressing with saturating in NS, but patient still unable to tolerate full removal with several doses of PRN pain meds. Patient requested not to mess with it anymore d/t pain. This RN redressed the wound and educated patient to continue with elevating leg. Patient able to achieve a little rest once leg redressed. Will continue to monitor patient.   Problem: Education: Goal: Knowledge of General Education information will improve Description: Including pain rating scale, medication(s)/side effects and non-pharmacologic comfort measures Outcome: Progressing   Problem: Activity: Goal: Risk for activity intolerance will decrease Outcome: Progressing   Problem: Pain Managment: Goal: General experience of comfort will improve Outcome: Progressing   Problem: Safety: Goal: Ability to remain free from injury will improve Outcome: Progressing   Problem: Skin Integrity: Goal: Risk for impaired skin integrity will decrease Outcome: Progressing

## 2020-01-06 NOTE — H&P (View-Only) (Signed)
[facility]  Reason for Consult:LLE wound/pyoderma gangrenosum Referring Physician: Dr. Harrell Lark is an 35 y.o. female.  HPI: Patient is a 35 year old female who presented to the [facility] emergency room on 01/04/2020 for evaluation of her left lower extremity wound.  She reports to me today that she presented to the ED for evaluation of leg after noticing increased pain, foul odors, nausea, vomiting, fevers, chills at home.  She reports to me today that 3 years ago she was diagnosed with pyoderma gangrenosum and has been receiving treatment over the past 3 years via as per the wound care clinic.  She reports that approximately 6 to 8 months ago the [place] wound care clinic had closed and for 6 months she had been managing this at home by herself.  She does report that for the past 2 months or so she has been seen by the wound care clinic in [place].  She reports that she was previously treated by Dr. Marla Roe 3 years ago with her initial diagnosis.  She reports that she is also seeing dermatology in [place], Dr. Saundra Shelling. She reports that she previously was on Humira for the left lower extremity wound/pyoderma gangrenosum, however Medicaid will no longer pay for it.  She reports that after stopping the Humira she has noticed a decline in her healing process.   She is worried about her leg, she is tearful today.  She reports that she is feeling a lot better in regards to fevers, chills, nausea, vomiting, however continues to have significant pain in her left lower extremity.  She reports that she has been having difficulty with dressing changes due to the pain and they have been unable to remove some gauze that have dried into the wound bed.  She does have a history of diabetes, reports this is well controlled.  Patient has an x-ray suggestive of chronic osteomyelitis involving the distal tibia and fibula diaphyses subsequent MRI yesterday showed  mild diffuse cellulitis but no soft tissue abscesses, myositis mainly involving the anterior tibialis and peroneal muscles, but no specific findings for septic arthritis or osteomyelitis.  Patient reports to me today that she does not have a diagnosis of any inflammatory bowel disease, however she does have symptoms suggestive of ulcerative colitis.   Patient reports she has had vascular consultation in the past, she reports that she is not aware of any vein or vascular abnormalities contributing.   Past Medical History:  Diagnosis Date  . Diabetes mellitus without complication ([place])    with pregnancy    Past Surgical History:  Procedure Laterality Date  . APPLICATION OF A-CELL OF EXTREMITY Left 01/19/2017   Procedure: APPLICATION OF A-CELL OF EXTREMITY AND WOUND VAC;  Surgeon: Wallace Going, DO;  Location: [place];  Service: Plastics;  Laterality: Left;  . APPLICATION OF WOUND VAC Left 01/16/2017   Procedure: APPLICATION OF WOUND VAC;  Surgeon: Dorna Leitz, MD;  Location: [place];  Service: Orthopedics;  Laterality: Left;  . CESAREAN SECTION    . I & D EXTREMITY Left 01/16/2017   Procedure: DEBRIDEMENT OF LEFT ANTERIOR AND POSTERLATERAL TIBIAL WOUNDS;  Surgeon: Dorna Leitz, MD;  Location: [place];  Service: Orthopedics;  Laterality: Left;  . I & D EXTREMITY Left 01/19/2017   Procedure: IRRIGATION AND DEBRIDEMENT EXTREMITY;  Surgeon: Wallace Going, DO;  Location: [place];  Service: Plastics;  Laterality: Left;    History reviewed. No pertinent family history.  Social History:  reports that  she has been smoking cigarettes. She has been smoking about 0.50 packs per day. She has never used smokeless tobacco. She reports that she does not drink alcohol and does not use drugs.  Allergies:  Allergies  Allergen Reactions  . Carbamazepine Other (See Comments)    Causes aplastic anemia Other reaction(s): Other (See Comments) Causes aplastic anemia "TEGRETOL"   . Zolpidem Tartrate  Rash    "AMBIEN"     Medications: I have reviewed the patient's current medications.  Results for orders placed or performed during the hospital encounter of 01/04/20 (from the past 48 hour(s))  CBG monitoring, ED     Status: Abnormal   Collection Time: 01/04/20  5:38 PM  Result Value Ref Range   Glucose-Capillary 166 (H) 70 - 99 mg/dL    Comment: Glucose reference range applies only to samples taken after fasting for at least 8 hours.   Comment 1 Notify RN   Comprehensive metabolic panel     Status: Abnormal   Collection Time: 01/04/20  5:46 PM  Result Value Ref Range   Sodium 138 135 - 145 mmol/L   Potassium 3.1 (L) 3.5 - 5.1 mmol/L   Chloride 106 98 - 111 mmol/L   CO2 18 (L) 22 - 32 mmol/L   Glucose, Bld 179 (H) 70 - 99 mg/dL    Comment: Glucose reference range applies only to samples taken after fasting for at least 8 hours.   BUN 7 6 - 20 mg/dL   Creatinine, Ser 0.89 0.44 - 1.00 mg/dL   Calcium 8.9 8.9 - 10.3 mg/dL   Total Protein 7.0 6.5 - 8.1 g/dL   Albumin 3.1 (L) 3.5 - 5.0 g/dL   AST 24 15 - 41 U/L   ALT 41 0 - 44 U/L   Alkaline Phosphatase 133 (H) 38 - 126 U/L   Total Bilirubin 0.5 0.3 - 1.2 mg/dL   GFR, Estimated >60 >60 mL/min    Comment: (NOTE) Calculated using the CKD-EPI Creatinine Equation (2021)    Anion gap 14 5 - 15    Comment: Performed at [facility], [place] [address] [place], [place] [address]  Lactic acid, plasma     Status: Abnormal   Collection Time: 01/04/20  5:46 PM  Result Value Ref Range   Lactic Acid, Venous 3.1 (HH) 0.5 - 1.9 mmol/L    Comment: CRITICAL RESULT CALLED TO, READ BACK BY AND VERIFIED WITH: K.COBB,RN @1843  01/04/2020 [email or web address].J Performed at [facility], [place] [address], [place], [place] [address]   CBC with Differential     Status: Abnormal   Collection Time: 01/04/20  5:46 PM  Result Value Ref Range   WBC 11.1 (H) 4.0 - 10.5 K/uL   RBC 4.21 3.87 - 5.11 MIL/uL   Hemoglobin 9.6 (L) 12.0 - 15.0 g/dL   HCT 33.4 (L) 36.0  - 46.0 %   MCV 79.3 (L) 80.0 - 100.0 fL   MCH 22.8 (L) 26.0 - 34.0 pg   MCHC 28.7 (L) 30.0 - 36.0 g/dL   RDW 18.7 (H) 11.5 - 15.5 %   Platelets 364 150 - 400 K/uL   nRBC 0.0 0.0 - 0.2 %   Neutrophils Relative % 58 %   Neutro Abs 6.6 1.7 - 7.7 K/uL   Lymphocytes Relative 29 %   Lymphs Abs 3.2 0.7 - 4.0 K/uL   Monocytes Relative 7 %   Monocytes Absolute 0.8 0.1 - 1.0 K/uL   Eosinophils Relative 4 %  Eosinophils Absolute 0.4 0.0 - 0.5 K/uL   Basophils Relative 1 %   Basophils Absolute 0.1 0.0 - 0.1 K/uL   Immature Granulocytes 1 %   Abs Immature Granulocytes 0.08 (H) 0.00 - 0.07 K/uL    Comment: Performed at [facility] [address], [place], [place] [address]  Protime-INR     Status: None   Collection Time: 01/04/20  5:46 PM  Result Value Ref Range   Prothrombin Time 13.4 11.4 - 15.2 seconds   INR 1.1 0.8 - 1.2    Comment: (NOTE) INR goal varies based on device and disease states. Performed at [facility], [place] [address], [place], [place] [address]   Culture, blood (Routine x 2)     Status: None (Preliminary result)   Collection Time: 01/04/20  5:47 PM   Specimen: BLOOD  Result Value Ref Range   Specimen Description BLOOD SITE NOT SPECIFIED    Special Requests      BOTTLES DRAWN AEROBIC AND ANAEROBIC Blood Culture adequate volume   Culture      NO GROWTH 2 DAYS Performed at [facility], [place] [address] [place], [place] [address]    Report Status PENDING   I-Stat beta hCG blood, ED     Status: None   Collection Time: 01/04/20  6:03 PM  Result Value Ref Range   I-stat hCG, quantitative <5.0 <5 mIU/mL   Comment 3            Comment:   GEST. AGE      CONC.  (mIU/mL)   <=1 WEEK        5 - 50     2 WEEKS       50 - 500     3 WEEKS       100 - 10,000     4 WEEKS     1,000 - 30,000        FEMALE AND NON-PREGNANT FEMALE:     LESS THAN 5 mIU/mL   Lactic acid, plasma     Status: None   Collection Time: 01/04/20  8:52 PM  Result Value Ref Range    Lactic Acid, Venous 1.0 0.5 - 1.9 mmol/L    Comment: Performed at [facility], [place] [address], [place], [place] [address]  Culture, blood (Routine x 2)     Status: None (Preliminary result)   Collection Time: 01/04/20  8:52 PM   Specimen: BLOOD  Result Value Ref Range   Specimen Description BLOOD LEFT ANTECUBITAL    Special Requests      BOTTLES DRAWN AEROBIC AND ANAEROBIC Blood Culture adequate volume   Culture      NO GROWTH 2 DAYS Performed at [facility], [place] [address], [place],  [address]    Report Status PENDING   SARS CORONAVIRUS 2 (TAT 6-24 HRS) Nasopharyngeal Nasopharyngeal Swab     Status: None   Collection Time: 01/04/20  9:56 PM   Specimen: Nasopharyngeal Swab  Result Value Ref Range   SARS Coronavirus 2 NEGATIVE NEGATIVE    Comment: (NOTE) SARS-CoV-2 target nucleic acids are NOT DETECTED.  The SARS-CoV-2 RNA is generally detectable in upper and lower respiratory specimens during the acute phase of infection. Negative results do not preclude SARS-CoV-2 infection, do not rule out co-infections with other pathogens, and should not be used as the sole basis for treatment or other patient management decisions. Negative results must be combined with clinical observations,  patient history, and epidemiological information. The expected result is Negative.  Fact Sheet for Patients: SugarRoll.be  Fact Sheet for Healthcare Providers: https://www.woods-mathews.com/  This test is not yet approved or cleared by the [place] FDA and  has been authorized for detection and/or diagnosis of SARS-CoV-2 by FDA under an Emergency Use Authorization (EUA). This EUA will remain  in effect (meaning this test can be used) for the duration of the COVID-19 declaration under Se ction 564(b)(1) of the Act, 21 U.S.C. section 360bbb-3(b)(1), unless the authorization is terminated or revoked sooner.  Performed at [facility], [place] [address], [place], [place] [address]   Urinalysis, Routine w reflex microscopic Urine, Clean Catch     Status: None   Collection Time: 01/04/20 10:33 PM  Result Value Ref Range   Color, Urine YELLOW YELLOW   APPearance CLEAR CLEAR   Specific Gravity, Urine 1.013 1.005 - 1.030   pH 6.0 5.0 - 8.0   Glucose, UA NEGATIVE NEGATIVE mg/dL   Hgb urine dipstick NEGATIVE NEGATIVE   Bilirubin Urine NEGATIVE NEGATIVE   Ketones, ur NEGATIVE NEGATIVE mg/dL   Protein, ur NEGATIVE NEGATIVE mg/dL   Nitrite NEGATIVE NEGATIVE   Leukocytes,Ua NEGATIVE NEGATIVE    Comment: Performed at [facility] [address] [place], [place] [address]  CBG monitoring, ED     Status: Abnormal   Collection Time: 01/05/20  1:02 AM  Result Value Ref Range   Glucose-Capillary 101 (H) 70 - 99 mg/dL    Comment: Glucose reference range applies only to samples taken after fasting for at least 8 hours.  HIV Antibody (routine testing w rflx)     Status: None   Collection Time: 01/05/20  1:11 AM  Result Value Ref Range   HIV Screen 4th Generation wRfx Non Reactive Non Reactive    Comment: Performed at [facility], [place] [address] [place], [place] [address]  CBC     Status: Abnormal   Collection Time: 01/05/20  1:11 AM  Result Value Ref Range   WBC 10.2 4.0 - 10.5 K/uL   RBC 3.99 3.87 - 5.11 MIL/uL   Hemoglobin 9.1 (L) 12.0 - 15.0 g/dL   HCT 31.0 (L) 36.0 - 46.0 %   MCV 77.7 (L) 80.0 - 100.0 fL   MCH 22.8 (L) 26.0 - 34.0 pg   MCHC 29.4 (L) 30.0 - 36.0 g/dL   RDW 18.6 (H) 11.5 - 15.5 %   Platelets 357 150 - 400 K/uL   nRBC 0.0 0.0 - 0.2 %    Comment: Performed at [facility], [place] [address] [place], [place] [address]  Creatinine, serum     Status: None   Collection Time: 01/05/20  1:11 AM  Result Value Ref Range   Creatinine, Ser 0.75 0.44 - 1.00 mg/dL   GFR, Estimated >60 >60 mL/min    Comment: (NOTE) Calculated using the CKD-EPI Creatinine Equation (2021) Performed at [facility] [address] [place] [address], [place], [place] [address]   Hemoglobin A1c     Status: Abnormal   Collection Time: 01/05/20  1:11 AM  Result Value Ref Range   Hgb A1c MFr Bld 6.7 (H) 4.8 - 5.6 %    Comment: (NOTE) Pre diabetes:          5.7%-6.4%  Diabetes:              >6.4%  Glycemic control for   <7.0% adults with diabetes  Mean Plasma Glucose 145.59 mg/dL    Comment: Performed at [facility] [address], [place], [place] [id number]  Basic metabolic panel     Status: Abnormal   Collection Time: 01/05/20  5:25 AM  Result Value Ref Range   Sodium 139 135 - 145 mmol/L   Potassium 3.2 (L) 3.5 - 5.1 mmol/L   Chloride 108 98 - 111 mmol/L   CO2 19 (L) 22 - 32 mmol/L   Glucose, Bld 120 (H) 70 - 99 mg/dL    Comment: Glucose reference range applies only to samples taken after fasting for at least 8 hours.   BUN 7 6 - 20 mg/dL   Creatinine, Ser 0.76 0.44 - 1.00 mg/dL   Calcium 8.2 (L) 8.9 - 10.3 mg/dL   GFR, Estimated >60 >60 mL/min    Comment: (NOTE) Calculated using the CKD-EPI Creatinine Equation (2021)    Anion gap 12 5 - 15    Comment: Performed at [facility] [address] [place], [place] [address]  CBC     Status: Abnormal   Collection Time: 01/05/20  5:25 AM  Result Value Ref Range   WBC 6.9 4.0 - 10.5 K/uL   RBC 4.53 3.87 - 5.11 MIL/uL   Hemoglobin 10.5 (L) 12.0 - 15.0 g/dL   HCT 35.2 (L) 36.0 - 46.0 %   MCV 77.7 (L) 80.0 - 100.0 fL   MCH 23.2 (L) 26.0 - 34.0 pg   MCHC 29.8 (L) 30.0 - 36.0 g/dL   RDW 18.6 (H) 11.5 - 15.5 %   Platelets 283 150 - 400 K/uL    Comment: Performed at [facility], [place] [address] [place], [place] [address]  CBG monitoring, ED     Status: Abnormal   Collection Time: 01/05/20  7:45 AM  Result Value Ref Range   Glucose-Capillary 108 (H) 70 - 99 mg/dL    Comment: Glucose reference range applies only to samples taken after fasting for at least 8 hours.   Comment 1 Notify RN    Comment 2 Document in Chart   CBG  monitoring, ED     Status: Abnormal   Collection Time: 01/05/20  1:40 PM  Result Value Ref Range   Glucose-Capillary 159 (H) 70 - 99 mg/dL    Comment: Glucose reference range applies only to samples taken after fasting for at least 8 hours.   Comment 1 Document in Chart   CBG monitoring, ED     Status: Abnormal   Collection Time: 01/05/20  4:36 PM  Result Value Ref Range   Glucose-Capillary 102 (H) 70 - 99 mg/dL    Comment: Glucose reference range applies only to samples taken after fasting for at least 8 hours.  Glucose, capillary     Status: Abnormal   Collection Time: 01/05/20  9:06 PM  Result Value Ref Range   Glucose-Capillary 144 (H) 70 - 99 mg/dL    Comment: Glucose reference range applies only to samples taken after fasting for at least 8 hours.   Comment 1 Document in Chart   Aerobic/Anaerobic Culture (surgical/deep wound)     Status: None (Preliminary result)   Collection Time: 01/05/20 10:19 PM   Specimen: Wound  Result Value Ref Range   Specimen Description WOUND LEFT LEG    Special Requests NONE    Gram Stain      FEW WBC PRESENT, PREDOMINANTLY PMN RARE GRAM NEGATIVE RODS RARE GRAM POSITIVE COCCI IN CHAINS  Culture      NO GROWTH < 12 HOURS Performed at [facility] [address] [place], [place] [address]    Report Status PENDING   Basic metabolic panel     Status: Abnormal   Collection Time: 01/06/20  4:26 AM  Result Value Ref Range   Sodium 139 135 - 145 mmol/L   Potassium 4.0 3.5 - 5.1 mmol/L   Chloride 109 98 - 111 mmol/L   CO2 18 (L) 22 - 32 mmol/L   Glucose, Bld 112 (H) 70 - 99 mg/dL    Comment: Glucose reference range applies only to samples taken after fasting for at least 8 hours.   BUN 7 6 - 20 mg/dL   Creatinine, Ser 0.66 0.44 - 1.00 mg/dL   Calcium 8.7 (L) 8.9 - 10.3 mg/dL   GFR, Estimated >60 >60 mL/min    Comment: (NOTE) Calculated using the CKD-EPI Creatinine Equation (2021)    Anion gap 12 5 - 15    Comment: Performed at [facility] [address], [place], [place] [address]  Magnesium     Status: None   Collection Time: 01/06/20  4:26 AM  Result Value Ref Range   Magnesium 2.0 1.7 - 2.4 mg/dL    Comment: Performed at [facility] [address], [place], [place] [address]  Phosphorus     Status: None   Collection Time: 01/06/20  4:26 AM  Result Value Ref Range   Phosphorus 3.1 2.5 - 4.6 mg/dL    Comment: Performed at [facility] [address], [place], [place] [address]  CBC with Differential/Platelet     Status: Abnormal   Collection Time: 01/06/20  4:26 AM  Result Value Ref Range   WBC 7.7 4.0 - 10.5 K/uL   RBC 3.58 (L) 3.87 - 5.11 MIL/uL   Hemoglobin 8.1 (L) 12.0 - 15.0 g/dL    Comment: Reticulocyte Hemoglobin testing may be clinically indicated, consider ordering this additional test [id number]    HCT 27.4 (L) 36.0 - 46.0 %   MCV 76.5 (L) 80.0 - 100.0 fL   MCH 22.6 (L) 26.0 - 34.0 pg   MCHC 29.6 (L) 30.0 - 36.0 g/dL   RDW 18.2 (H) 11.5 - 15.5 %   Platelets 325 150 - 400 K/uL   nRBC 0.0 0.0 - 0.2 %   Neutrophils Relative % 54 %   Neutro Abs 4.2 1.7 - 7.7 K/uL   Lymphocytes Relative 30 %   Lymphs Abs 2.3 0.7 - 4.0 K/uL   Monocytes Relative 8 %   Monocytes Absolute 0.6 0.1 - 1.0 K/uL   Eosinophils Relative 7 %   Eosinophils Absolute 0.5 0.0 - 0.5 K/uL   Basophils Relative 1 %   Basophils Absolute 0.1 0.0 - 0.1 K/uL   Immature Granulocytes 0 %   Abs Immature Granulocytes 0.03 0.00 - 0.07 K/uL    Comment: Performed at [facility], [address] [place], [place] [address]  Glucose, capillary     Status: Abnormal   Collection Time: 01/06/20  7:04 AM  Result Value Ref Range   Glucose-Capillary 115 (H) 70 - 99 mg/dL    Comment: Glucose reference range applies only to samples taken after fasting for at least 8 hours.   Comment 1 Document in Chart   Glucose, capillary     Status: Abnormal   Collection Time: 01/06/20 11:16 AM  Result Value Ref Range    Glucose-Capillary  118 (H) 70 - 99 mg/dL    Comment: Glucose reference range applies only to samples taken after fasting for at least 8 hours.    DG Tibia/Fibula Left  Result Date: 01/04/2020 CLINICAL DATA:  Left leg wound, increased drainage, foul odor EXAM: LEFT TIBIA AND FIBULA - 2 VIEW COMPARISON:  01/16/2017 FINDINGS: The soft tissue defect at the level of the distal diaphysis of the tibia and fibula has markedly increased in size in keeping with interval surgical debridement. There is extensive subcutaneous edema within the left lower extremity more proximal to the area of debris mint. There is developing exuberant periosteal reaction involving the anterior cortex of the distal tibia and medial cortex of the a fibula in the region of debridement suspicious for chronic osteomyelitis or, less likely, reactive hyperostosis. No associated fracture or dislocation. IMPRESSION: Interval debridement with enlarging surgical defect. Developing exuberant periosteal reaction suspicious for changes of chronic osteomyelitis involving the distal tibia and fibula diaphyses. This could be better assessed with contrast enhanced MRI examination. Electronically Signed   By: Fidela Salisbury MD   On: 01/04/2020 20:28   MR TIBIA FIBULA LEFT W WO CONTRAST  Result Date: 01/05/2020 CLINICAL DATA:  Diabetic with chronic leg wound. History of pyoderma gangrenosum. Open wound lower extremity. EXAM: MRI OF LOWER LEFT EXTREMITY WITHOUT AND WITH CONTRAST TECHNIQUE: Multiplanar, multisequence MR imaging of the left lower extremity was performed both before and after administration of intravenous contrast. CONTRAST:  57mL GADAVIST GADOBUTROL 1 MMOL/ML IV SOLN COMPARISON:  01/16/2017 FINDINGS: Extensive soft tissue defect involving almost entire circumference of the left lower extremity distally. Some surrounding enhancing granulation tissue and subcutaneous soft tissue swelling/edema suggesting cellulitis. No findings for drainable soft  soft tissue abscess. Underlying myositis mainly involving the anterior tibialis and peroneal muscles with inflammation/edema and enhancement but no findings to suggest pyomyositis. Distal cortical thickening of the tibia likely chronic periosteal reaction but no MR findings to suggest osteomyelitis. No evidence of septic arthritis at the ankle or knee joints. IMPRESSION: 1. Chronic extensive soft tissue defect involving almost entire circumference of the left lower extremity distally. 2. Mild diffuse cellulitis but no soft tissue abscess. 3. Myositis mainly involving the anterior tibialis and peroneal muscles but no findings for pyomyositis. 4. No findings for septic arthritis or osteomyelitis. Electronically Signed   By: Marijo Sanes M.D.   On: 01/05/2020 09:10    Review of Systems  Constitutional: Negative.   Respiratory: Negative.   Cardiovascular: Negative.   Gastrointestinal: Negative.    Blood pressure 125/71, pulse 79, temperature 98.9 F (37.2 C), temperature source Oral, resp. rate 20, height 5\' 8"  (1.727 m), weight 123.8 kg, SpO2 99 %. Physical Exam Constitutional:      General: She is not in acute distress.    Appearance: Normal appearance. She is not ill-appearing.  HENT:     Head: Normocephalic and atraumatic.  Cardiovascular:     Pulses: Normal pulses.  Pulmonary:     Effort: Pulmonary effort is normal.  Skin:         Comments: Reviewed EMR photos of left lower extremity wound.  Exposed tendon noted.  Fibrinous exudate noted within the wound bed.  Neurological:     General: No focal deficit present.     Mental Status: She is alert.  Psychiatric:        Mood and Affect: Mood normal.     Assessment/Plan:  LLE wound, cellulitis and myositis, pyoderma gangrenosum:  Patient with chronic left lower extremity wound  with diagnosis of pyoderma gangrenosum, wound has been present for approximately 3 years.  Patient initially underwent debridement January 2019 and  subsequently has been doing wound care with multiple different providers.  Wound has significantly worsened over the past few weeks, she now has exposed tendon and significant amount of exudate noted on review of EMR photos.   Patient admits to poor diet at home, she does endorse that she quit smoking about a year ago.  Antibiotics per ID DVT prophylaxis per primary team Recommend optimizing nutritional status with protein shakes as able, avoid high sugar or high carb diet.  Will discuss patient's case with plastic surgery team and follow-up this week to discuss plan. Appreciate consultation        Kermit Balo Deara Bober, PA-C 01/06/2020, 1:48 PM

## 2020-01-06 NOTE — Consult Note (Signed)
[facility]  Reason for Consult:LLE wound/pyoderma gangrenosum Referring Physician: Dr. Harrell Lark is an 35 y.o. female.  HPI: Patient is a 35 year old female who presented to the [facility] emergency room on 01/04/2020 for evaluation of her left lower extremity wound.  She reports to me today that she presented to the ED for evaluation of leg after noticing increased pain, foul odors, nausea, vomiting, fevers, chills at home.  She reports to me today that 3 years ago she was diagnosed with pyoderma gangrenosum and has been receiving treatment over the past 3 years via as per the wound care clinic.  She reports that approximately 6 to 8 months ago the [place] wound care clinic had closed and for 6 months she had been managing this at home by herself.  She does report that for the past 2 months or so she has been seen by the wound care clinic in [place].  She reports that she was previously treated by Dr. Marla Roe 3 years ago with her initial diagnosis.  She reports that she is also seeing dermatology in [place], Dr. Saundra Shelling. She reports that she previously was on Humira for the left lower extremity wound/pyoderma gangrenosum, however Medicaid will no longer pay for it.  She reports that after stopping the Humira she has noticed a decline in her healing process.   She is worried about her leg, she is tearful today.  She reports that she is feeling a lot better in regards to fevers, chills, nausea, vomiting, however continues to have significant pain in her left lower extremity.  She reports that she has been having difficulty with dressing changes due to the pain and they have been unable to remove some gauze that have dried into the wound bed.  She does have a history of diabetes, reports this is well controlled.  Patient has an x-ray suggestive of chronic osteomyelitis involving the distal tibia and fibula diaphyses subsequent MRI yesterday showed  mild diffuse cellulitis but no soft tissue abscesses, myositis mainly involving the anterior tibialis and peroneal muscles, but no specific findings for septic arthritis or osteomyelitis.  Patient reports to me today that she does not have a diagnosis of any inflammatory bowel disease, however she does have symptoms suggestive of ulcerative colitis.   Patient reports she has had vascular consultation in the past, she reports that she is not aware of any vein or vascular abnormalities contributing.   Past Medical History:  Diagnosis Date  . Diabetes mellitus without complication ([place])    with pregnancy    Past Surgical History:  Procedure Laterality Date  . APPLICATION OF A-CELL OF EXTREMITY Left 01/19/2017   Procedure: APPLICATION OF A-CELL OF EXTREMITY AND WOUND VAC;  Surgeon: Wallace Going, DO;  Location: [place];  Service: Plastics;  Laterality: Left;  . APPLICATION OF WOUND VAC Left 01/16/2017   Procedure: APPLICATION OF WOUND VAC;  Surgeon: Dorna Leitz, MD;  Location: [place];  Service: Orthopedics;  Laterality: Left;  . CESAREAN SECTION    . I & D EXTREMITY Left 01/16/2017   Procedure: DEBRIDEMENT OF LEFT ANTERIOR AND POSTERLATERAL TIBIAL WOUNDS;  Surgeon: Dorna Leitz, MD;  Location: [place];  Service: Orthopedics;  Laterality: Left;  . I & D EXTREMITY Left 01/19/2017   Procedure: IRRIGATION AND DEBRIDEMENT EXTREMITY;  Surgeon: Wallace Going, DO;  Location: [place];  Service: Plastics;  Laterality: Left;    History reviewed. No pertinent family history.  Social History:  reports that  she has been smoking cigarettes. She has been smoking about 0.50 packs per day. She has never used smokeless tobacco. She reports that she does not drink alcohol and does not use drugs.  Allergies:  Allergies  Allergen Reactions  . Carbamazepine Other (See Comments)    Causes aplastic anemia Other reaction(s): Other (See Comments) Causes aplastic anemia "TEGRETOL"   . Zolpidem Tartrate  Rash    "AMBIEN"     Medications: I have reviewed the patient's current medications.  Results for orders placed or performed during the hospital encounter of 01/04/20 (from the past 48 hour(s))  CBG monitoring, ED     Status: Abnormal   Collection Time: 01/04/20  5:38 PM  Result Value Ref Range   Glucose-Capillary 166 (H) 70 - 99 mg/dL    Comment: Glucose reference range applies only to samples taken after fasting for at least 8 hours.   Comment 1 Notify RN   Comprehensive metabolic panel     Status: Abnormal   Collection Time: 01/04/20  5:46 PM  Result Value Ref Range   Sodium 138 135 - 145 mmol/L   Potassium 3.1 (L) 3.5 - 5.1 mmol/L   Chloride 106 98 - 111 mmol/L   CO2 18 (L) 22 - 32 mmol/L   Glucose, Bld 179 (H) 70 - 99 mg/dL    Comment: Glucose reference range applies only to samples taken after fasting for at least 8 hours.   BUN 7 6 - 20 mg/dL   Creatinine, Ser 0.89 0.44 - 1.00 mg/dL   Calcium 8.9 8.9 - 10.3 mg/dL   Total Protein 7.0 6.5 - 8.1 g/dL   Albumin 3.1 (L) 3.5 - 5.0 g/dL   AST 24 15 - 41 U/L   ALT 41 0 - 44 U/L   Alkaline Phosphatase 133 (H) 38 - 126 U/L   Total Bilirubin 0.5 0.3 - 1.2 mg/dL   GFR, Estimated >60 >60 mL/min    Comment: (NOTE) Calculated using the CKD-EPI Creatinine Equation (2021)    Anion gap 14 5 - 15    Comment: Performed at [facility], [place] [address], [place], [place] [address]  Lactic acid, plasma     Status: Abnormal   Collection Time: 01/04/20  5:46 PM  Result Value Ref Range   Lactic Acid, Venous 3.1 (HH) 0.5 - 1.9 mmol/L    Comment: CRITICAL RESULT CALLED TO, READ BACK BY AND VERIFIED WITH: K.COBB,RN @1843  01/04/2020 [email or web address].J Performed at [facility], [place] [address] [place], [place] [address]   CBC with Differential     Status: Abnormal   Collection Time: 01/04/20  5:46 PM  Result Value Ref Range   WBC 11.1 (H) 4.0 - 10.5 K/uL   RBC 4.21 3.87 - 5.11 MIL/uL   Hemoglobin 9.6 (L) 12.0 - 15.0 g/dL   HCT 33.4 (L) 36.0  - 46.0 %   MCV 79.3 (L) 80.0 - 100.0 fL   MCH 22.8 (L) 26.0 - 34.0 pg   MCHC 28.7 (L) 30.0 - 36.0 g/dL   RDW 18.7 (H) 11.5 - 15.5 %   Platelets 364 150 - 400 K/uL   nRBC 0.0 0.0 - 0.2 %   Neutrophils Relative % 58 %   Neutro Abs 6.6 1.7 - 7.7 K/uL   Lymphocytes Relative 29 %   Lymphs Abs 3.2 0.7 - 4.0 K/uL   Monocytes Relative 7 %   Monocytes Absolute 0.8 0.1 - 1.0 K/uL   Eosinophils Relative 4 %  Eosinophils Absolute 0.4 0.0 - 0.5 K/uL   Basophils Relative 1 %   Basophils Absolute 0.1 0.0 - 0.1 K/uL   Immature Granulocytes 1 %   Abs Immature Granulocytes 0.08 (H) 0.00 - 0.07 K/uL    Comment: Performed at [facility] [address], [place], [place] [address]  Protime-INR     Status: None   Collection Time: 01/04/20  5:46 PM  Result Value Ref Range   Prothrombin Time 13.4 11.4 - 15.2 seconds   INR 1.1 0.8 - 1.2    Comment: (NOTE) INR goal varies based on device and disease states. Performed at [facility], [place] [address], [place], [place] [address]   Culture, blood (Routine x 2)     Status: None (Preliminary result)   Collection Time: 01/04/20  5:47 PM   Specimen: BLOOD  Result Value Ref Range   Specimen Description BLOOD SITE NOT SPECIFIED    Special Requests      BOTTLES DRAWN AEROBIC AND ANAEROBIC Blood Culture adequate volume   Culture      NO GROWTH 2 DAYS Performed at [facility], [place] [address], [place], [place] [address]    Report Status PENDING   I-Stat beta hCG blood, ED     Status: None   Collection Time: 01/04/20  6:03 PM  Result Value Ref Range   I-stat hCG, quantitative <5.0 <5 mIU/mL   Comment 3            Comment:   GEST. AGE      CONC.  (mIU/mL)   <=1 WEEK        5 - 50     2 WEEKS       50 - 500     3 WEEKS       100 - 10,000     4 WEEKS     1,000 - 30,000        FEMALE AND NON-PREGNANT FEMALE:     LESS THAN 5 mIU/mL   Lactic acid, plasma     Status: None   Collection Time: 01/04/20  8:52 PM  Result Value Ref Range    Lactic Acid, Venous 1.0 0.5 - 1.9 mmol/L    Comment: Performed at [facility], [place] [address] [place], [place] [address]  Culture, blood (Routine x 2)     Status: None (Preliminary result)   Collection Time: 01/04/20  8:52 PM   Specimen: BLOOD  Result Value Ref Range   Specimen Description BLOOD LEFT ANTECUBITAL    Special Requests      BOTTLES DRAWN AEROBIC AND ANAEROBIC Blood Culture adequate volume   Culture      NO GROWTH 2 DAYS Performed at [facility], [place] [address], [place], [place] [address]    Report Status PENDING   SARS CORONAVIRUS 2 (TAT 6-24 HRS) Nasopharyngeal Nasopharyngeal Swab     Status: None   Collection Time: 01/04/20  9:56 PM   Specimen: Nasopharyngeal Swab  Result Value Ref Range   SARS Coronavirus 2 NEGATIVE NEGATIVE    Comment: (NOTE) SARS-CoV-2 target nucleic acids are NOT DETECTED.  The SARS-CoV-2 RNA is generally detectable in upper and lower respiratory specimens during the acute phase of infection. Negative results do not preclude SARS-CoV-2 infection, do not rule out co-infections with other pathogens, and should not be used as the sole basis for treatment or other patient management decisions. Negative results must be combined with clinical observations,  patient history, and epidemiological information. The expected result is Negative.  Fact Sheet for Patients: SugarRoll.be  Fact Sheet for Healthcare Providers: https://www.woods-mathews.com/  This test is not yet approved or cleared by the [place] FDA and  has been authorized for detection and/or diagnosis of SARS-CoV-2 by FDA under an Emergency Use Authorization (EUA). This EUA will remain  in effect (meaning this test can be used) for the duration of the COVID-19 declaration under Se ction 564(b)(1) of the Act, 21 U.S.C. section 360bbb-3(b)(1), unless the authorization is terminated or revoked sooner.  Performed at [facility], [place] [address], [place], [place] [address]   Urinalysis, Routine w reflex microscopic Urine, Clean Catch     Status: None   Collection Time: 01/04/20 10:33 PM  Result Value Ref Range   Color, Urine YELLOW YELLOW   APPearance CLEAR CLEAR   Specific Gravity, Urine 1.013 1.005 - 1.030   pH 6.0 5.0 - 8.0   Glucose, UA NEGATIVE NEGATIVE mg/dL   Hgb urine dipstick NEGATIVE NEGATIVE   Bilirubin Urine NEGATIVE NEGATIVE   Ketones, ur NEGATIVE NEGATIVE mg/dL   Protein, ur NEGATIVE NEGATIVE mg/dL   Nitrite NEGATIVE NEGATIVE   Leukocytes,Ua NEGATIVE NEGATIVE    Comment: Performed at [facility] [address] [place], [place] [address]  CBG monitoring, ED     Status: Abnormal   Collection Time: 01/05/20  1:02 AM  Result Value Ref Range   Glucose-Capillary 101 (H) 70 - 99 mg/dL    Comment: Glucose reference range applies only to samples taken after fasting for at least 8 hours.  HIV Antibody (routine testing w rflx)     Status: None   Collection Time: 01/05/20  1:11 AM  Result Value Ref Range   HIV Screen 4th Generation wRfx Non Reactive Non Reactive    Comment: Performed at [facility], [place] [address] [place], [place] [address]  CBC     Status: Abnormal   Collection Time: 01/05/20  1:11 AM  Result Value Ref Range   WBC 10.2 4.0 - 10.5 K/uL   RBC 3.99 3.87 - 5.11 MIL/uL   Hemoglobin 9.1 (L) 12.0 - 15.0 g/dL   HCT 31.0 (L) 36.0 - 46.0 %   MCV 77.7 (L) 80.0 - 100.0 fL   MCH 22.8 (L) 26.0 - 34.0 pg   MCHC 29.4 (L) 30.0 - 36.0 g/dL   RDW 18.6 (H) 11.5 - 15.5 %   Platelets 357 150 - 400 K/uL   nRBC 0.0 0.0 - 0.2 %    Comment: Performed at [facility], [place] [address] [place], [place] [address]  Creatinine, serum     Status: None   Collection Time: 01/05/20  1:11 AM  Result Value Ref Range   Creatinine, Ser 0.75 0.44 - 1.00 mg/dL   GFR, Estimated >60 >60 mL/min    Comment: (NOTE) Calculated using the CKD-EPI Creatinine Equation (2021) Performed at [facility] [address] [place], [place] [address]   Hemoglobin A1c     Status: Abnormal   Collection Time: 01/05/20  1:11 AM  Result Value Ref Range   Hgb A1c MFr Bld 6.7 (H) 4.8 - 5.6 %    Comment: (NOTE) Pre diabetes:          5.7%-6.4%  Diabetes:              >6.4%  Glycemic control for   <7.0% adults with diabetes  Mean Plasma Glucose 145.59 mg/dL    Comment: Performed at [facility] [address], [place], [place] [id number]  Basic metabolic panel     Status: Abnormal   Collection Time: 01/05/20  5:25 AM  Result Value Ref Range   Sodium 139 135 - 145 mmol/L   Potassium 3.2 (L) 3.5 - 5.1 mmol/L   Chloride 108 98 - 111 mmol/L   CO2 19 (L) 22 - 32 mmol/L   Glucose, Bld 120 (H) 70 - 99 mg/dL    Comment: Glucose reference range applies only to samples taken after fasting for at least 8 hours.   BUN 7 6 - 20 mg/dL   Creatinine, Ser 0.76 0.44 - 1.00 mg/dL   Calcium 8.2 (L) 8.9 - 10.3 mg/dL   GFR, Estimated >60 >60 mL/min    Comment: (NOTE) Calculated using the CKD-EPI Creatinine Equation (2021)    Anion gap 12 5 - 15    Comment: Performed at [facility] [address], [place], [place] [address]  CBC     Status: Abnormal   Collection Time: 01/05/20  5:25 AM  Result Value Ref Range   WBC 6.9 4.0 - 10.5 K/uL   RBC 4.53 3.87 - 5.11 MIL/uL   Hemoglobin 10.5 (L) 12.0 - 15.0 g/dL   HCT 35.2 (L) 36.0 - 46.0 %   MCV 77.7 (L) 80.0 - 100.0 fL   MCH 23.2 (L) 26.0 - 34.0 pg   MCHC 29.8 (L) 30.0 - 36.0 g/dL   RDW 18.6 (H) 11.5 - 15.5 %   Platelets 283 150 - 400 K/uL    Comment: Performed at [facility], [place] [address], [place], [place] [address]  CBG monitoring, ED     Status: Abnormal   Collection Time: 01/05/20  7:45 AM  Result Value Ref Range   Glucose-Capillary 108 (H) 70 - 99 mg/dL    Comment: Glucose reference range applies only to samples taken after fasting for at least 8 hours.   Comment 1 Notify RN    Comment 2 Document in Chart   CBG  monitoring, ED     Status: Abnormal   Collection Time: 01/05/20  1:40 PM  Result Value Ref Range   Glucose-Capillary 159 (H) 70 - 99 mg/dL    Comment: Glucose reference range applies only to samples taken after fasting for at least 8 hours.   Comment 1 Document in Chart   CBG monitoring, ED     Status: Abnormal   Collection Time: 01/05/20  4:36 PM  Result Value Ref Range   Glucose-Capillary 102 (H) 70 - 99 mg/dL    Comment: Glucose reference range applies only to samples taken after fasting for at least 8 hours.  Glucose, capillary     Status: Abnormal   Collection Time: 01/05/20  9:06 PM  Result Value Ref Range   Glucose-Capillary 144 (H) 70 - 99 mg/dL    Comment: Glucose reference range applies only to samples taken after fasting for at least 8 hours.   Comment 1 Document in Chart   Aerobic/Anaerobic Culture (surgical/deep wound)     Status: None (Preliminary result)   Collection Time: 01/05/20 10:19 PM   Specimen: Wound  Result Value Ref Range   Specimen Description WOUND LEFT LEG    Special Requests NONE    Gram Stain      FEW WBC PRESENT, PREDOMINANTLY PMN RARE GRAM NEGATIVE RODS RARE GRAM POSITIVE COCCI IN CHAINS  Culture      NO GROWTH < 12 HOURS Performed at [facility] [address] [place], [place] [address]    Report Status PENDING   Basic metabolic panel     Status: Abnormal   Collection Time: 01/06/20  4:26 AM  Result Value Ref Range   Sodium 139 135 - 145 mmol/L   Potassium 4.0 3.5 - 5.1 mmol/L   Chloride 109 98 - 111 mmol/L   CO2 18 (L) 22 - 32 mmol/L   Glucose, Bld 112 (H) 70 - 99 mg/dL    Comment: Glucose reference range applies only to samples taken after fasting for at least 8 hours.   BUN 7 6 - 20 mg/dL   Creatinine, Ser 0.66 0.44 - 1.00 mg/dL   Calcium 8.7 (L) 8.9 - 10.3 mg/dL   GFR, Estimated >60 >60 mL/min    Comment: (NOTE) Calculated using the CKD-EPI Creatinine Equation (2021)    Anion gap 12 5 - 15    Comment: Performed at [facility] [address] [place], [place] [address]  Magnesium     Status: None   Collection Time: 01/06/20  4:26 AM  Result Value Ref Range   Magnesium 2.0 1.7 - 2.4 mg/dL    Comment: Performed at [facility] [address], [place], [place] [address]  Phosphorus     Status: None   Collection Time: 01/06/20  4:26 AM  Result Value Ref Range   Phosphorus 3.1 2.5 - 4.6 mg/dL    Comment: Performed at [facility] [address], [place], [place] [address]  CBC with Differential/Platelet     Status: Abnormal   Collection Time: 01/06/20  4:26 AM  Result Value Ref Range   WBC 7.7 4.0 - 10.5 K/uL   RBC 3.58 (L) 3.87 - 5.11 MIL/uL   Hemoglobin 8.1 (L) 12.0 - 15.0 g/dL    Comment: Reticulocyte Hemoglobin testing may be clinically indicated, consider ordering this additional test [id number]    HCT 27.4 (L) 36.0 - 46.0 %   MCV 76.5 (L) 80.0 - 100.0 fL   MCH 22.6 (L) 26.0 - 34.0 pg   MCHC 29.6 (L) 30.0 - 36.0 g/dL   RDW 18.2 (H) 11.5 - 15.5 %   Platelets 325 150 - 400 K/uL   nRBC 0.0 0.0 - 0.2 %   Neutrophils Relative % 54 %   Neutro Abs 4.2 1.7 - 7.7 K/uL   Lymphocytes Relative 30 %   Lymphs Abs 2.3 0.7 - 4.0 K/uL   Monocytes Relative 8 %   Monocytes Absolute 0.6 0.1 - 1.0 K/uL   Eosinophils Relative 7 %   Eosinophils Absolute 0.5 0.0 - 0.5 K/uL   Basophils Relative 1 %   Basophils Absolute 0.1 0.0 - 0.1 K/uL   Immature Granulocytes 0 %   Abs Immature Granulocytes 0.03 0.00 - 0.07 K/uL    Comment: Performed at [facility], [address], [place], [place] [address]  Glucose, capillary     Status: Abnormal   Collection Time: 01/06/20  7:04 AM  Result Value Ref Range   Glucose-Capillary 115 (H) 70 - 99 mg/dL    Comment: Glucose reference range applies only to samples taken after fasting for at least 8 hours.   Comment 1 Document in Chart   Glucose, capillary     Status: Abnormal   Collection Time: 01/06/20 11:16 AM  Result Value Ref Range    Glucose-Capillary  118 (H) 70 - 99 mg/dL    Comment: Glucose reference range applies only to samples taken after fasting for at least 8 hours.    DG Tibia/Fibula Left  Result Date: 01/04/2020 CLINICAL DATA:  Left leg wound, increased drainage, foul odor EXAM: LEFT TIBIA AND FIBULA - 2 VIEW COMPARISON:  01/16/2017 FINDINGS: The soft tissue defect at the level of the distal diaphysis of the tibia and fibula has markedly increased in size in keeping with interval surgical debridement. There is extensive subcutaneous edema within the left lower extremity more proximal to the area of debris mint. There is developing exuberant periosteal reaction involving the anterior cortex of the distal tibia and medial cortex of the a fibula in the region of debridement suspicious for chronic osteomyelitis or, less likely, reactive hyperostosis. No associated fracture or dislocation. IMPRESSION: Interval debridement with enlarging surgical defect. Developing exuberant periosteal reaction suspicious for changes of chronic osteomyelitis involving the distal tibia and fibula diaphyses. This could be better assessed with contrast enhanced MRI examination. Electronically Signed   By: Fidela Salisbury MD   On: 01/04/2020 20:28   MR TIBIA FIBULA LEFT W WO CONTRAST  Result Date: 01/05/2020 CLINICAL DATA:  Diabetic with chronic leg wound. History of pyoderma gangrenosum. Open wound lower extremity. EXAM: MRI OF LOWER LEFT EXTREMITY WITHOUT AND WITH CONTRAST TECHNIQUE: Multiplanar, multisequence MR imaging of the left lower extremity was performed both before and after administration of intravenous contrast. CONTRAST:  16mL GADAVIST GADOBUTROL 1 MMOL/ML IV SOLN COMPARISON:  01/16/2017 FINDINGS: Extensive soft tissue defect involving almost entire circumference of the left lower extremity distally. Some surrounding enhancing granulation tissue and subcutaneous soft tissue swelling/edema suggesting cellulitis. No findings for drainable soft  soft tissue abscess. Underlying myositis mainly involving the anterior tibialis and peroneal muscles with inflammation/edema and enhancement but no findings to suggest pyomyositis. Distal cortical thickening of the tibia likely chronic periosteal reaction but no MR findings to suggest osteomyelitis. No evidence of septic arthritis at the ankle or knee joints. IMPRESSION: 1. Chronic extensive soft tissue defect involving almost entire circumference of the left lower extremity distally. 2. Mild diffuse cellulitis but no soft tissue abscess. 3. Myositis mainly involving the anterior tibialis and peroneal muscles but no findings for pyomyositis. 4. No findings for septic arthritis or osteomyelitis. Electronically Signed   By: Marijo Sanes M.D.   On: 01/05/2020 09:10    Review of Systems  Constitutional: Negative.   Respiratory: Negative.   Cardiovascular: Negative.   Gastrointestinal: Negative.    Blood pressure 125/71, pulse 79, temperature 98.9 F (37.2 C), temperature source Oral, resp. rate 20, height 5\' 8"  (1.727 m), weight 123.8 kg, SpO2 99 %. Physical Exam Constitutional:      General: She is not in acute distress.    Appearance: Normal appearance. She is not ill-appearing.  HENT:     Head: Normocephalic and atraumatic.  Cardiovascular:     Pulses: Normal pulses.  Pulmonary:     Effort: Pulmonary effort is normal.  Skin:         Comments: Reviewed EMR photos of left lower extremity wound.  Exposed tendon noted.  Fibrinous exudate noted within the wound bed.  Neurological:     General: No focal deficit present.     Mental Status: She is alert.  Psychiatric:        Mood and Affect: Mood normal.     Assessment/Plan:  LLE wound, cellulitis and myositis, pyoderma gangrenosum:  Patient with chronic left lower extremity wound  with diagnosis of pyoderma gangrenosum, wound has been present for approximately 3 years.  Patient initially underwent debridement January 2019 and  subsequently has been doing wound care with multiple different providers.  Wound has significantly worsened over the past few weeks, she now has exposed tendon and significant amount of exudate noted on review of EMR photos.   Patient admits to poor diet at home, she does endorse that she quit smoking about a year ago.  Antibiotics per ID DVT prophylaxis per primary team Recommend optimizing nutritional status with protein shakes as able, avoid high sugar or high carb diet.  Will discuss patient's case with plastic surgery team and follow-up this week to discuss plan. Appreciate consultation        Kermit Balo Zeda Gangwer, PA-C 01/06/2020, 1:48 PM

## 2020-01-06 NOTE — Progress Notes (Signed)
[facility] PROGRESS NOTE    [name]  [id number] DOB: [birth year] DOA: 01/04/2020 PCP: Patient, No Pcp Per      Brief Narrative:  [name] is a 35 y.o. F with history of type 2 diabetes on oral hypoglycemics, pyoderma gangrenosum and chronic left leg wound with polymicrobial infection presents for evaluation of fever, worsening leg pain and purulent drainage.  Unfortunately she had worsening left leg wound for last 3 years, poor outpatient support and follow-up, multiple previous interventions including hydrotherapy and last seen at wound care clinic 1 week ago presents to the ER because it is started smelling foul and draining purulent.  Reported temperature 102 at home. At the emergency room, afebrile but tachycardic.  Lactic acid 3.1.  Patient also on Metformin at home.  Blood pressure stable.  Treated with IV fluids and antibiotics with sepsis protocol and admitted to the hospital.     Assessment & Plan:  Acute on chronic soft tissue infection with open wound of the left leg MRI with no evidence of osteomyelitis, no clinical evidence of underlying collection or abscess. -Continue vancomycin and Zosyn -Consult ID -COnsult Plasit Surgery, per ID -Outpatient dermatology follow up  -Follow wound culture    Type 2 diabetes Glucoses controlled -Continue SS corrections -Hold Farxiga, metformin  Hypokalemia Resolved  Anemia microcytic -Check iron studies       Disposition: Status is: Inpatient  Remains inpatient appropriate because:IV treatments appropriate due to intensity of illness or inability to take PO   Dispo:  Patient From: Home  Planned Disposition: Home with Health Care Svc  Expected discharge date: 01/08/2020  Medically stable for discharge: No               MDM: The below labs and imaging reports were reviewed and summarized above.  Medication management as above.     DVT prophylaxis: enoxaparin (LOVENOX)  injection 40 mg Start: 01/05/20 1000  Code Status: FULL Family Communication:             Subjective: Feeling pain in her leg. No fever, no drainage on dressing.  No confusion, dyspnea.   Objective: Vitals:   01/06/20 0327 01/06/20 0747 01/06/20 0747 01/06/20 1610  BP: 113/78 125/71 125/71 114/75  Pulse: 77 79 79 71  Resp: 16 20 20 16   Temp: 98.5 F (36.9 C) 98.9 F (37.2 C) 98.9 F (37.2 C) 98 F (36.7 C)  TempSrc: Oral Oral Oral Oral  SpO2: 98% 99% 99% 99%  Weight:      Height:        Intake/Output Summary (Last 24 hours) at 01/06/2020 1920 Last data filed at 01/06/2020 1800 Gross per 24 hour  Intake 1499.69 ml  Output 1 ml  Net 1498.69 ml   Filed Weights   01/04/20 1908 01/04/20 2228  Weight: 123.8 kg 123.8 kg    Examination General appearance:  adult female, alert and in moderate pain HEENT: Anicteric, conjunctiva pink, lids and lashes normal. No nasal deformity, discharge, epistaxis.  Lips moist.   Skin: Warm and dry.  no jaundice.  No suspicious rashes or lesions. Cardiac: RRR, nl S1-S2, no murmurs appreciated.  Capillary refill is brisk.  JVP not visible.  No LE edema.  Radial  pulses 2+ and symmetric. Respiratory: Normal respiratory rate and rhythm.  CTAB without rales or wheezes. Abdomen: Abdomen soft.  no TTP. No ascites, distension, hepatosplenomegaly.   MSK: No deformities or effusions. Neuro: Awake and alert.  EOMI, moves all extremities. Speech fluent.  Psych: Sensorium intact and responding to questions, attention normal. Affect normal.  Judgment and insight appear normal.    Data Reviewed: I have personally reviewed following labs and imaging studies:  CBC: Recent Labs  Lab 01/04/20 1746 01/05/20 0111 01/05/20 0525 01/06/20 0426  WBC 11.1* 10.2 6.9 7.7  NEUTROABS 6.6  --   --  4.2  HGB 9.6* 9.1* 10.5* 8.1*  HCT 33.4* 31.0* 35.2* 27.4*  MCV 79.3* 77.7* 77.7* 76.5*  PLT 364 357 283 [id number]   Basic Metabolic Panel: Recent Labs  Lab  01/04/20 1746 01/05/20 0111 01/05/20 0525 01/06/20 0426  NA 138  --  139 139  K 3.1*  --  3.2* 4.0  CL 106  --  108 109  CO2 18*  --  19* 18*  GLUCOSE 179*  --  120* 112*  BUN 7  --  7 7  CREATININE 0.89 0.75 0.76 0.66  CALCIUM 8.9  --  8.2* 8.7*  MG  --   --   --  2.0  PHOS  --   --   --  3.1   GFR: Estimated Creatinine Clearance: 134.9 mL/min (by C-G formula based on SCr of 0.66 mg/dL). Liver Function Tests: Recent Labs  Lab 01/04/20 1746  AST 24  ALT 41  ALKPHOS 133*  BILITOT 0.5  PROT 7.0  ALBUMIN 3.1*   No results for input(s): LIPASE, AMYLASE in the last 168 hours. No results for input(s): AMMONIA in the last 168 hours. Coagulation Profile: Recent Labs  Lab 01/04/20 1746  INR 1.1   Cardiac Enzymes: Recent Labs  Lab 01/06/20 0426  CKTOTAL 50   BNP (last 3 results) No results for input(s): PROBNP in the last 8760 hours. HbA1C: Recent Labs    01/05/20 0111  HGBA1C 6.7*   CBG: Recent Labs  Lab 01/05/20 1636 01/05/20 2106 01/06/20 0704 01/06/20 1116 01/06/20 1608  GLUCAP 102* 144* 115* 118* 152*   Lipid Profile: No results for input(s): CHOL, HDL, LDLCALC, TRIG, CHOLHDL, LDLDIRECT in the last 72 hours. Thyroid Function Tests: No results for input(s): TSH, T4TOTAL, FREET4, T3FREE, THYROIDAB in the last 72 hours. Anemia Panel: No results for input(s): VITAMINB12, FOLATE, FERRITIN, TIBC, IRON, RETICCTPCT in the last 72 hours. Urine analysis:    Component Value Date/Time   COLORURINE YELLOW 01/04/2020 2233   APPEARANCEUR CLEAR 01/04/2020 2233   LABSPEC 1.013 01/04/2020 2233   PHURINE 6.0 01/04/2020 2233   GLUCOSEU NEGATIVE 01/04/2020 2233   HGBUR NEGATIVE 01/04/2020 2233   BILIRUBINUR NEGATIVE 01/04/2020 2233   KETONESUR NEGATIVE 01/04/2020 2233   PROTEINUR NEGATIVE 01/04/2020 2233   NITRITE NEGATIVE 01/04/2020 2233   LEUKOCYTESUR NEGATIVE 01/04/2020 2233   Sepsis Labs: @LABRCNTIP (procalcitonin:4,lacticacidven:4)  ) Recent Results  (from the past 240 hour(s))  Culture, blood (Routine x 2)     Status: None (Preliminary result)   Collection Time: 01/04/20  5:47 PM   Specimen: BLOOD  Result Value Ref Range Status   Specimen Description BLOOD SITE NOT SPECIFIED  Final   Special Requests   Final    BOTTLES DRAWN AEROBIC AND ANAEROBIC Blood Culture adequate volume   Culture   Final    NO GROWTH 2 DAYS Performed at [facility], [place] [address], [place], [place] [address]    Report Status PENDING  Incomplete  Culture, blood (Routine x 2)     Status: None (Preliminary result)   Collection Time: 01/04/20  8:52 PM   Specimen: BLOOD  Result Value Ref Range Status  Specimen Description BLOOD LEFT ANTECUBITAL  Final   Special Requests   Final    BOTTLES DRAWN AEROBIC AND ANAEROBIC Blood Culture adequate volume   Culture   Final    NO GROWTH 2 DAYS Performed at [facility], [address], [place], [place] [address]    Report Status PENDING  Incomplete  SARS CORONAVIRUS 2 (TAT 6-24 HRS) Nasopharyngeal Nasopharyngeal Swab     Status: None   Collection Time: 01/04/20  9:56 PM   Specimen: Nasopharyngeal Swab  Result Value Ref Range Status   SARS Coronavirus 2 NEGATIVE NEGATIVE Final    Comment: (NOTE) SARS-CoV-2 target nucleic acids are NOT DETECTED.  The SARS-CoV-2 RNA is generally detectable in upper and lower respiratory specimens during the acute phase of infection. Negative results do not preclude SARS-CoV-2 infection, do not rule out co-infections with other pathogens, and should not be used as the sole basis for treatment or other patient management decisions. Negative results must be combined with clinical observations, patient history, and epidemiological information. The expected result is Negative.  Fact Sheet for Patients: SugarRoll.be  Fact Sheet for Healthcare Providers: https://www.woods-mathews.com/  This test is not yet approved or cleared by  the [place] FDA and  has been authorized for detection and/or diagnosis of SARS-CoV-2 by FDA under an Emergency Use Authorization (EUA). This EUA will remain  in effect (meaning this test can be used) for the duration of the COVID-19 declaration under Se ction 564(b)(1) of the Act, 21 U.S.C. section 360bbb-3(b)(1), unless the authorization is terminated or revoked sooner.  Performed at [facility], [place] [address], [place], [place] [address]   Aerobic/Anaerobic Culture (surgical/deep wound)     Status: None (Preliminary result)   Collection Time: 01/05/20 10:19 PM   Specimen: Wound  Result Value Ref Range Status   Specimen Description WOUND LEFT LEG  Final   Special Requests NONE  Final   Gram Stain   Final    FEW WBC PRESENT, PREDOMINANTLY PMN RARE GRAM NEGATIVE RODS RARE GRAM POSITIVE COCCI IN CHAINS    Culture   Final    NO GROWTH < 12 HOURS Performed at [facility], [place] [address], [place], [place] [address]    Report Status PENDING  Incomplete         Radiology Studies: DG Tibia/Fibula Left  Result Date: 01/04/2020 CLINICAL DATA:  Left leg wound, increased drainage, foul odor EXAM: LEFT TIBIA AND FIBULA - 2 VIEW COMPARISON:  01/16/2017 FINDINGS: The soft tissue defect at the level of the distal diaphysis of the tibia and fibula has markedly increased in size in keeping with interval surgical debridement. There is extensive subcutaneous edema within the left lower extremity more proximal to the area of debris mint. There is developing exuberant periosteal reaction involving the anterior cortex of the distal tibia and medial cortex of the a fibula in the region of debridement suspicious for chronic osteomyelitis or, less likely, reactive hyperostosis. No associated fracture or dislocation. IMPRESSION: Interval debridement with enlarging surgical defect. Developing exuberant periosteal reaction suspicious for changes of chronic osteomyelitis involving the  distal tibia and fibula diaphyses. This could be better assessed with contrast enhanced MRI examination. Electronically Signed   By: Fidela Salisbury MD   On: 01/04/2020 20:28   MR TIBIA FIBULA LEFT W WO CONTRAST  Result Date: 01/05/2020 CLINICAL DATA:  Diabetic with chronic leg wound. History of pyoderma gangrenosum. Open wound lower extremity. EXAM: MRI OF LOWER LEFT EXTREMITY WITHOUT  AND WITH CONTRAST TECHNIQUE: Multiplanar, multisequence MR imaging of the left lower extremity was performed both before and after administration of intravenous contrast. CONTRAST:  22mL GADAVIST GADOBUTROL 1 MMOL/ML IV SOLN COMPARISON:  01/16/2017 FINDINGS: Extensive soft tissue defect involving almost entire circumference of the left lower extremity distally. Some surrounding enhancing granulation tissue and subcutaneous soft tissue swelling/edema suggesting cellulitis. No findings for drainable soft soft tissue abscess. Underlying myositis mainly involving the anterior tibialis and peroneal muscles with inflammation/edema and enhancement but no findings to suggest pyomyositis. Distal cortical thickening of the tibia likely chronic periosteal reaction but no MR findings to suggest osteomyelitis. No evidence of septic arthritis at the ankle or knee joints. IMPRESSION: 1. Chronic extensive soft tissue defect involving almost entire circumference of the left lower extremity distally. 2. Mild diffuse cellulitis but no soft tissue abscess. 3. Myositis mainly involving the anterior tibialis and peroneal muscles but no findings for pyomyositis. 4. No findings for septic arthritis or osteomyelitis. Electronically Signed   By: Rudie Meyer M.D.   On: 01/05/2020 09:10        Scheduled Meds: . enoxaparin (LOVENOX) injection  40 mg Subcutaneous Daily  . insulin aspart  0-15 Units Subcutaneous TID WC  . insulin aspart  0-5 Units Subcutaneous QHS  . magnesium oxide  400 mg Oral Once  . potassium chloride  40 mEq Oral BID  .  pregabalin  100 mg Oral TID   Continuous Infusions: . DAPTOmycin (CUBICIN)  IV    . lactated ringers Stopped (01/04/20 2002)  . lactated ringers 100 mL/hr at 01/05/20 2047  . piperacillin-tazobactam (ZOSYN)  IV 3.375 g (01/06/20 1344)     LOS: 2 days    Time spent: 25 mintues    Alberteen Sam, MD [facility] 01/06/2020, 7:20 PM     Please page though AMION or Epic secure chat:  For [employer], Higher education careers adviser

## 2020-01-06 NOTE — Progress Notes (Signed)
[facility]  Date of Admission:  01/04/2020           Reason for visit: Follow up on pyoderma gangrenosum  Interval events: No acute events noted Afebrile No leukocytosis Blood cultures x2 - Wound cultures polymicrobial on Gram stain   ASSESSMENT:    #Cellulitis and myositis of left lower extremity #Pyoderma gangrenosum #Hepatitis C antibody positive #Diabetes  PLAN:    --Continue piperacillin tazobactam pending wound cultures --Due to difficulties with dosing vancomycin, will switch to daptomycin --Follow-up wound cultures --Follow-up hep C RNA --Wound care, glycemic control --Derm eval  MEDICATIONS:    Scheduled Meds: . enoxaparin (LOVENOX) injection  40 mg Subcutaneous Daily  . insulin aspart  0-15 Units Subcutaneous TID WC  . insulin aspart  0-5 Units Subcutaneous QHS  . magnesium oxide  400 mg Oral Once  . potassium chloride  40 mEq Oral BID  . pregabalin  100 mg Oral TID    Continuous Infusions: . DAPTOmycin (CUBICIN)  IV    . lactated ringers Stopped (01/04/20 2002)  . lactated ringers 100 mL/hr at 01/05/20 2047  . piperacillin-tazobactam (ZOSYN)  IV 3.375 g (01/06/20 0616)    PRN Meds: acetaminophen **OR** acetaminophen, HYDROmorphone (DILAUDID) injection, magnesium oxide, ondansetron **OR** ondansetron (ZOFRAN) IV, oxyCODONE-acetaminophen, senna-docusate  SUBJECTIVE:   She reports feeling better this morning.  Her appetite is improved.  She is tolerating antibiotics.  She asked that I not unwrap her wound since it was just done last night  Review of Systems  Constitutional: Negative for chills and fever.  Respiratory: Negative.   Cardiovascular: Negative.   Skin:       + Wound  Neurological: Negative.   Psychiatric/Behavioral: Negative.      OBJECTIVE:   Allergies  Allergen Reactions  . Carbamazepine Other (See Comments)    Causes aplastic anemia Other reaction(s): Other (See Comments) Causes aplastic  anemia "TEGRETOL"   . Zolpidem Tartrate Rash    "AMBIEN"     Blood pressure 125/71, pulse 79, temperature 98.9 F (37.2 C), temperature source Oral, resp. rate 20, height 5\' 8"  (1.727 m), weight 123.8 kg, SpO2 99 %. Body mass index is 41.51 kg/m.  Physical Exam Constitutional:      General: She is not in acute distress.    Appearance: Normal appearance.  HENT:     Head: Normocephalic and atraumatic.  Pulmonary:     Effort: Pulmonary effort is normal. No respiratory distress.  Skin:    Comments: Left foot is wrapped in ACE bandage.   Neurological:     General: No focal deficit present.     Mental Status: She is alert and oriented to person, place, and time.  Psychiatric:        Mood and Affect: Mood normal.        Behavior: Behavior normal.        Lab Results & Microbiology Lab Results  Component Value Date   WBC 7.7 01/06/2020   HGB 8.1 (L) 01/06/2020   HCT 27.4 (L) 01/06/2020   MCV 76.5 (L) 01/06/2020   PLT 325 01/06/2020    Lab Results  Component Value Date   NA 139 01/06/2020   K 4.0 01/06/2020   CO2 18 (L) 01/06/2020   GLUCOSE 112 (H) 01/06/2020   BUN 7 01/06/2020   CREATININE 0.66 01/06/2020   CALCIUM 8.7 (L) 01/06/2020   GFRNONAA >60 01/06/2020   GFRAA >60 01/20/2017    Lab Results  Component Value  Date   ALT 41 01/04/2020   AST 24 01/04/2020   ALKPHOS 133 (H) 01/04/2020   BILITOT 0.5 01/04/2020     I have reviewed the micro and lab results in Epic.  Imaging DG Tibia/Fibula Left  Result Date: 01/04/2020 CLINICAL DATA:  Left leg wound, increased drainage, foul odor EXAM: LEFT TIBIA AND FIBULA - 2 VIEW COMPARISON:  01/16/2017 FINDINGS: The soft tissue defect at the level of the distal diaphysis of the tibia and fibula has markedly increased in size in keeping with interval surgical debridement. There is extensive subcutaneous edema within the left lower extremity more proximal to the area of debris mint. There is developing exuberant periosteal  reaction involving the anterior cortex of the distal tibia and medial cortex of the a fibula in the region of debridement suspicious for chronic osteomyelitis or, less likely, reactive hyperostosis. No associated fracture or dislocation. IMPRESSION: Interval debridement with enlarging surgical defect. Developing exuberant periosteal reaction suspicious for changes of chronic osteomyelitis involving the distal tibia and fibula diaphyses. This could be better assessed with contrast enhanced MRI examination. Electronically Signed   By: Helyn Numbers MD   On: 01/04/2020 20:28   MR TIBIA FIBULA LEFT W WO CONTRAST  Result Date: 01/05/2020 CLINICAL DATA:  Diabetic with chronic leg wound. History of pyoderma gangrenosum. Open wound lower extremity. EXAM: MRI OF LOWER LEFT EXTREMITY WITHOUT AND WITH CONTRAST TECHNIQUE: Multiplanar, multisequence MR imaging of the left lower extremity was performed both before and after administration of intravenous contrast. CONTRAST:  43mL GADAVIST GADOBUTROL 1 MMOL/ML IV SOLN COMPARISON:  01/16/2017 FINDINGS: Extensive soft tissue defect involving almost entire circumference of the left lower extremity distally. Some surrounding enhancing granulation tissue and subcutaneous soft tissue swelling/edema suggesting cellulitis. No findings for drainable soft soft tissue abscess. Underlying myositis mainly involving the anterior tibialis and peroneal muscles with inflammation/edema and enhancement but no findings to suggest pyomyositis. Distal cortical thickening of the tibia likely chronic periosteal reaction but no MR findings to suggest osteomyelitis. No evidence of septic arthritis at the ankle or knee joints. IMPRESSION: 1. Chronic extensive soft tissue defect involving almost entire circumference of the left lower extremity distally. 2. Mild diffuse cellulitis but no soft tissue abscess. 3. Myositis mainly involving the anterior tibialis and peroneal muscles but no findings for  pyomyositis. 4. No findings for septic arthritis or osteomyelitis. Electronically Signed   By: Rudie Meyer M.D.   On: 01/05/2020 09:10       [facility] Coffer for Infectious [facility] [phone] pager 01/06/2020, 11:38 AM

## 2020-01-06 NOTE — Progress Notes (Signed)
The patient request that her bandage be removed tonight, when she is more prepared and comfortable.

## 2020-01-06 NOTE — Progress Notes (Signed)
Pharmacy Antibiotic Note  [name] is a 35 y.o. female admitted on 01/04/2020 with cellulitis and myositis. Patient with chronic LLE d/t pyoderma gangrenosum; MRI negative for osteomyelitis or abscess.  Pharmacy has been consulted for daptomycin dosing. Patient initially on vancomycin but will transition to daptomycin given challenging PK with obesity/kidney function as well as risk of nephrotoxicity with piperacillin/tazobactam. Will continue pip/tazo for now per ID.  WBC WNL, afebrile. Has previously grown E. Faecalis, Pseudomonas, Enterobacter and Peptostreptococcal spp in wound. Wound cx here growing rare GNRs and GPCs in chains.  Will start daptomycin ~ 6 mg/kg (500mg  = ~5.6 mg/kg) with adjusted body weight and check baseline CK. Follow-up CK weekly if not deescalated by then.   Plan: Discontinue vancomycin Initiate daptomycin 500mg  q24hr Continue piperacillin/taobactam 3.375g q8hr  Height: 5\' 8"  (172.7 cm) Weight: 123.8 kg (273 lb) IBW/kg (Calculated) : 63.9  Temp (24hrs), Avg:98.6 F (37 C), Min:98.2 F (36.8 C), Max:99 F (37.2 C)  Recent Labs  Lab 01/04/20 1746 01/04/20 2052 01/05/20 0111 01/05/20 0525 01/06/20 0426  WBC 11.1*  --  10.2 6.9 7.7  CREATININE 0.89  --  0.75 0.76 0.66  LATICACIDVEN 3.1* 1.0  --   --   --     Estimated Creatinine Clearance: 134.9 mL/min (by C-G formula based on SCr of 0.66 mg/dL).    Allergies  Allergen Reactions  . Carbamazepine Other (See Comments)    Causes aplastic anemia Other reaction(s): Other (See Comments) Causes aplastic anemia "TEGRETOL"   . Zolpidem Tartrate Rash    "AMBIEN"     Antimicrobials this admission: 1/1 vanc >> 1/3 1/2 pip/tazo >> 1/3 dapto >>  Microbiology results: 1/2 wound cx: rare GNRS, GPCs in chains 1/1 bcx: ngtd  Thank you for allowing pharmacy to be a part of this patient's care.  03/04/20, PharmD PGY2 ID Pharmacy Resident [phone]  01/06/2020 10:58 AM

## 2020-01-06 NOTE — Plan of Care (Signed)

## 2020-01-07 ENCOUNTER — Encounter (HOSPITAL_BASED_OUTPATIENT_CLINIC_OR_DEPARTMENT_OTHER): Payer: Medicaid Other | Admitting: Internal Medicine

## 2020-01-07 DIAGNOSIS — L88 Pyoderma gangrenosum: Secondary | ICD-10-CM | POA: Diagnosis not present

## 2020-01-07 DIAGNOSIS — E669 Obesity, unspecified: Secondary | ICD-10-CM | POA: Diagnosis not present

## 2020-01-07 DIAGNOSIS — M86662 Other chronic osteomyelitis, left tibia and fibula: Secondary | ICD-10-CM | POA: Diagnosis not present

## 2020-01-07 DIAGNOSIS — E1169 Type 2 diabetes mellitus with other specified complication: Secondary | ICD-10-CM | POA: Diagnosis not present

## 2020-01-07 DIAGNOSIS — L03116 Cellulitis of left lower limb: Secondary | ICD-10-CM | POA: Diagnosis not present

## 2020-01-07 LAB — BASIC METABOLIC PANEL
Anion gap: 13 (ref 5–15)
BUN: 8 mg/dL (ref 6–20)
CO2: 17 mmol/L — ABNORMAL LOW (ref 22–32)
Calcium: 8.7 mg/dL — ABNORMAL LOW (ref 8.9–10.3)
Chloride: 109 mmol/L (ref 98–111)
Creatinine, Ser: 0.76 mg/dL (ref 0.44–1.00)
GFR, Estimated: >60 mL/min (ref >60)
Glucose, Bld: 115 mg/dL — ABNORMAL HIGH (ref 70–99)
Potassium: 4.6 mmol/L (ref 3.5–5.1)
Sodium: 139 mmol/L (ref 135–145)

## 2020-01-07 LAB — CBC
HCT: 26.8 % — ABNORMAL LOW (ref 36.0–46.0)
Hemoglobin: 8.7 g/dL — ABNORMAL LOW (ref 12.0–15.0)
MCH: 23.3 pg — ABNORMAL LOW (ref 26.0–34.0)
MCHC: 32.5 g/dL (ref 30.0–36.0)
MCV: 71.8 fL — ABNORMAL LOW (ref 80.0–100.0)
Platelets: 285 10*3/uL (ref 150–400)
RBC: 3.73 MIL/uL — ABNORMAL LOW (ref 3.87–5.11)
RDW: 18.2 % — ABNORMAL HIGH (ref 11.5–15.5)
WBC: 7.8 10*3/uL (ref 4.0–10.5)
nRBC: 0 % (ref 0.0–0.2)

## 2020-01-07 LAB — GLUCOSE, CAPILLARY
Glucose-Capillary: 110 mg/dL — ABNORMAL HIGH (ref 70–99)
Glucose-Capillary: 130 mg/dL — ABNORMAL HIGH (ref 70–99)
Glucose-Capillary: 111 mg/dL — ABNORMAL HIGH (ref 70–99)
Glucose-Capillary: 118 mg/dL — ABNORMAL HIGH (ref 70–99)

## 2020-01-07 LAB — FERRITIN: Ferritin: 31 ng/mL (ref 11–307)

## 2020-01-07 LAB — HCV RNA QUANT RFLX ULTRA OR GENOTYP
HCV RNA Qnt(log copy/mL): UNDETERMINED log10 IU/mL
HepC Qn: NOT DETECTED IU/mL

## 2020-01-07 LAB — IRON AND TIBC
Iron: 18 ug/dL — ABNORMAL LOW (ref 28–170)
Saturation Ratios: 6 % — ABNORMAL LOW (ref 10.4–31.8)
TIBC: 315 ug/dL (ref 250–450)
UIBC: 297 ug/dL

## 2020-01-07 MED ORDER — DOCUSATE SODIUM 100 MG PO CAPS
100.0000 mg | ORAL_CAPSULE | Freq: Every day | ORAL | Status: DC
  Administered 2020-01-09 – 2020-01-10 (×2): 100 mg via ORAL
  Filled 2020-01-07 (×5): qty 1

## 2020-01-07 MED ORDER — FERROUS SULFATE 325 (65 FE) MG PO TABS
325.0000 mg | ORAL_TABLET | Freq: Two times a day (BID) | ORAL | Status: DC
  Administered 2020-01-07 – 2020-01-13 (×12): 325 mg via ORAL
  Filled 2020-01-07 (×12): qty 1

## 2020-01-07 NOTE — Progress Notes (Signed)
[facility] for Infectious Disease  Date of Admission:  01/04/2020           Reason for visit: Follow up on wound infection in the setting of pyoderma gangrenosum  Current antibiotics: Dapto Zosyn   ASSESSMENT:    #Cellulitis and myositis left lower extremity #Pyoderma gangrenosum #Hepatitis C antibody positive #Diabetes     PLAN:    --Continue piperacillin tazobactam --Continue daptomycin.  Monitor CK weekly (baseline 50 on 01/06/2020) --Follow-up wound cultures --Follow-up hep C RNA --Appreciate plastic surgery evaluation --Wound care, glycemic control  MEDICATIONS:    Scheduled Meds: . docusate sodium  100 mg Oral Daily  . enoxaparin (LOVENOX) injection  40 mg Subcutaneous Daily  . ferrous sulfate  325 mg Oral BID WC  . insulin aspart  0-15 Units Subcutaneous TID WC  . insulin aspart  0-5 Units Subcutaneous QHS  . magnesium oxide  400 mg Oral Once  . pregabalin  100 mg Oral TID    Continuous Infusions: . DAPTOmycin (CUBICIN)  IV 500 mg (01/06/20 2234)  . lactated ringers Stopped (01/04/20 2002)  . lactated ringers 100 mL/hr at 01/05/20 2047  . piperacillin-tazobactam (ZOSYN)  IV 3.375 g (01/07/20 0532)    PRN Meds: acetaminophen **OR** acetaminophen, HYDROmorphone (DILAUDID) injection, magnesium oxide, ondansetron **OR** ondansetron (ZOFRAN) IV, oxyCODONE-acetaminophen, senna-docusate   INTERVAL EVENTS:   No acute events Seen by plastic surgery service.  Plan pending. Cx remain negative (GS with GNR and GPC) Afebrile  SUBJECTIVE:   She reports her wound is improving.  She denies fevers or chills.  She is tolerating oral intake.  Review of Systems  Constitutional: Negative for chills and fever.  Respiratory: Negative.   Cardiovascular: Negative.   Gastrointestinal: Negative.   Skin:       Reports her wound is improving     OBJECTIVE:   Allergies  Allergen Reactions  . Carbamazepine Other (See Comments)    Causes aplastic  anemia Other reaction(s): Other (See Comments) Causes aplastic anemia "TEGRETOL"   . Zolpidem Tartrate Rash    "AMBIEN"     Blood pressure 120/68, pulse 70, temperature 98.5 F (36.9 C), temperature source Oral, resp. rate 18, height 5\' 8"  (1.727 m), weight 123.8 kg, SpO2 100 %. Body mass index is 41.51 kg/m.  Physical Exam Constitutional:      General: She is not in acute distress.    Appearance: Normal appearance.  HENT:     Head: Normocephalic and atraumatic.  Pulmonary:     Effort: Pulmonary effort is normal. No respiratory distress.  Skin:    Comments: Left foot wrapped  Neurological:     General: No focal deficit present.     Mental Status: She is alert and oriented to person, place, and time.  Psychiatric:        Mood and Affect: Mood normal.        Behavior: Behavior normal.       Lab Results & Microbiology Lab Results  Component Value Date   WBC 7.8 01/07/2020   HGB 8.7 (L) 01/07/2020   HCT 26.8 (L) 01/07/2020   MCV 71.8 (L) 01/07/2020   PLT 285 01/07/2020    Lab Results  Component Value Date   NA 139 01/07/2020   K 4.6 01/07/2020   CO2 17 (L) 01/07/2020   GLUCOSE 115 (H) 01/07/2020   BUN 8 01/07/2020   CREATININE 0.76 01/07/2020   CALCIUM 8.7 (L) 01/07/2020   GFRNONAA >60 01/07/2020   GFRAA >60  01/20/2017    Lab Results  Component Value Date   ALT 41 01/04/2020   AST 24 01/04/2020   ALKPHOS 133 (H) 01/04/2020   BILITOT 0.5 01/04/2020     I have reviewed the micro and lab results in Epic.  Imaging No results found.      Vedia Coffer for Infectious Disease [facility] [phone] pager 01/07/2020, 9:24 AM

## 2020-01-07 NOTE — Progress Notes (Signed)
[facility] PROGRESS NOTE    [name]  ELF:[id number] DOB: 1985-07-14 DOA: 01/04/2020 PCP: Patient, No Pcp Per      Brief Narrative:  Mrs. Calvillo is a 35 y.o. F with history of type 2 diabetes on oral hypoglycemics, pyoderma gangrenosum and chronic left leg wound with polymicrobial infection presents for evaluation of fever, worsening leg pain and purulent drainage.  Unfortunately she had worsening left leg wound for last 3 years, poor outpatient support and follow-up, multiple previous interventions including hydrotherapy and last seen at wound care clinic 1 week ago presents to the ER because it is started smelling foul and draining purulent.  Reported temperature 102 at home. At the emergency room, afebrile but tachycardic.  Lactic acid 3.1.  Patient also on Metformin at home.  Blood pressure stable.  Treated with IV fluids and antibiotics with sepsis protocol and admitted to the hospital.     Assessment & Plan:  Cellulitis and myositis in setting of known pyoderma gangrenosum MRI with no evidence of osteomyelitis, no clinical evidence of underlying collection or abscess.  Culture today growing Providencia and Pseudomonas -Continue daptomycin and Zosyn, defer to ID to tailor therapy to above cultures -Consult ID -Consult Plasit Surgery, per ID -Outpatient dermatology follow up  -Follow wound culture to finality   Diabetes Glucose well controlled -Continue sliding scale correction -Hold Farxiga, metformin  Hypokalemia Resolved  Anemia microcytic Iron deficient -Start iron -Outpatient GI referral       Disposition: Status is: Inpatient  Remains inpatient appropriate because:IV treatments appropriate due to intensity of illness or inability to take PO   Dispo:  Patient From: Home  Planned Disposition: Home with Health Care Svc  Expected discharge date: 01/08/2020  Medically stable for discharge: No               MDM: The  below labs and imaging reports were reviewed and summarized above.  Medication management as above.     DVT prophylaxis: enoxaparin (LOVENOX) injection 40 mg Start: 01/05/20 1000  Code Status: FULL Family Communication:             Subjective: F patient's pain is still severe, she had no fever, increase in drainage, confusion.  Objective: Vitals:   01/06/20 1610 01/06/20 2035 01/07/20 0323 01/07/20 0742  BP: 114/75 127/66 139/74 120/68  Pulse: 71 70 77 70  Resp: 16 16 17 18   Temp: 98 F (36.7 C) 98.3 F (36.8 C) 98.5 F (36.9 C) 98.5 F (36.9 C)  TempSrc: Oral Oral Oral Oral  SpO2: 99% 100% 100% 100%  Weight:      Height:        Intake/Output Summary (Last 24 hours) at 01/07/2020 1837 Last data filed at 01/07/2020 1500 Gross per 24 hour  Intake 530 ml  Output -  Net 530 ml   Filed Weights   01/04/20 1908 01/04/20 2228  Weight: 123.8 kg 123.8 kg    Examination General appearance: Adult female, lying in bed sleeping, arouses easily     HEENT:    Skin:  Cardiac: RRR, no murmurs, no lower extremity edema Respiratory: Normal respiratory rate and rhythm, lungs clear without rales or wheezes Abdomen: Abdomen soft no tenderness palpation or guarding, no ascites or distention MSK: The left leg is still wrapped.  No drainage on the dressing. Neuro: Awake alert, extraocular movements intact, moves all extremities normal strength and coordination, speech fluent.    Data Reviewed: I have personally reviewed following labs and imaging studies:  CBC: Recent Labs  Lab 01/04/20 1746 01/05/20 0111 01/05/20 0525 01/06/20 0426 01/07/20 0502  WBC 11.1* 10.2 6.9 7.7 7.8  NEUTROABS 6.6  --   --  4.2  --   HGB 9.6* 9.1* 10.5* 8.1* 8.7*  HCT 33.4* 31.0* 35.2* 27.4* 26.8*  MCV 79.3* 77.7* 77.7* 76.5* 71.8*  PLT 364 357 283 325 285   Basic Metabolic Panel: Recent Labs  Lab 01/04/20 1746 01/05/20 0111 01/05/20 0525 01/06/20 0426 01/07/20 0350  NA 138  --  139  139 139  K 3.1*  --  3.2* 4.0 4.6  CL 106  --  108 109 109  CO2 18*  --  19* 18* 17*  GLUCOSE 179*  --  120* 112* 115*  BUN 7  --  7 7 8   CREATININE 0.89 0.75 0.76 0.66 0.76  CALCIUM 8.9  --  8.2* 8.7* 8.7*  MG  --   --   --  2.0  --   PHOS  --   --   --  3.1  --    GFR: Estimated Creatinine Clearance: 134.9 mL/min (by C-G formula based on SCr of 0.76 mg/dL). Liver Function Tests: Recent Labs  Lab 01/04/20 1746  AST 24  ALT 41  ALKPHOS 133*  BILITOT 0.5  PROT 7.0  ALBUMIN 3.1*   No results for input(s): LIPASE, AMYLASE in the last 168 hours. No results for input(s): AMMONIA in the last 168 hours. Coagulation Profile: Recent Labs  Lab 01/04/20 1746  INR 1.1   Cardiac Enzymes: Recent Labs  Lab 01/06/20 0426  CKTOTAL 50   BNP (last 3 results) No results for input(s): PROBNP in the last 8760 hours. HbA1C: Recent Labs    01/05/20 0111  HGBA1C 6.7*   CBG: Recent Labs  Lab 01/06/20 1608 01/06/20 2036 01/07/20 0705 01/07/20 1147 01/07/20 1726  GLUCAP 152* 133* 110* 130* 111*   Lipid Profile: No results for input(s): CHOL, HDL, LDLCALC, TRIG, CHOLHDL, LDLDIRECT in the last 72 hours. Thyroid Function Tests: No results for input(s): TSH, T4TOTAL, FREET4, T3FREE, THYROIDAB in the last 72 hours. Anemia Panel: Recent Labs    01/07/20 0350  FERRITIN 31  TIBC 315  IRON 18*   Urine analysis:    Component Value Date/Time   COLORURINE YELLOW 01/04/2020 2233   APPEARANCEUR CLEAR 01/04/2020 2233   LABSPEC 1.013 01/04/2020 2233   PHURINE 6.0 01/04/2020 2233   GLUCOSEU NEGATIVE 01/04/2020 2233   HGBUR NEGATIVE 01/04/2020 2233   BILIRUBINUR NEGATIVE 01/04/2020 2233   KETONESUR NEGATIVE 01/04/2020 2233   PROTEINUR NEGATIVE 01/04/2020 2233   NITRITE NEGATIVE 01/04/2020 2233   LEUKOCYTESUR NEGATIVE 01/04/2020 2233   Sepsis Labs: @LABRCNTIP (procalcitonin:4,lacticacidven:4)  ) Recent Results (from the past 240 hour(s))  Culture, blood (Routine x 2)      Status: None (Preliminary result)   Collection Time: 01/04/20  5:47 PM   Specimen: BLOOD  Result Value Ref Range Status   Specimen Description BLOOD SITE NOT SPECIFIED  Final   Special Requests   Final    BOTTLES DRAWN AEROBIC AND ANAEROBIC Blood Culture adequate volume   Culture   Final    NO GROWTH 3 DAYS Performed at [facility], [address], [place], [place] [address]    Report Status PENDING  Incomplete  Culture, blood (Routine x 2)     Status: None (Preliminary result)   Collection Time: 01/04/20  8:52 PM   Specimen: BLOOD  Result Value Ref Range Status   Specimen  Description BLOOD LEFT ANTECUBITAL  Final   Special Requests   Final    BOTTLES DRAWN AEROBIC AND ANAEROBIC Blood Culture adequate volume   Culture   Final    NO GROWTH 3 DAYS Performed at [facility], [address] [place], [place] [address]    Report Status PENDING  Incomplete  SARS CORONAVIRUS 2 (TAT 6-24 HRS) Nasopharyngeal Nasopharyngeal Swab     Status: None   Collection Time: 01/04/20  9:56 PM   Specimen: Nasopharyngeal Swab  Result Value Ref Range Status   SARS Coronavirus 2 NEGATIVE NEGATIVE Final    Comment: (NOTE) SARS-CoV-2 target nucleic acids are NOT DETECTED.  The SARS-CoV-2 RNA is generally detectable in upper and lower respiratory specimens during the acute phase of infection. Negative results do not preclude SARS-CoV-2 infection, do not rule out co-infections with other pathogens, and should not be used as the sole basis for treatment or other patient management decisions. Negative results must be combined with clinical observations, patient history, and epidemiological information. The expected result is Negative.  Fact Sheet for Patients: SugarRoll.be  Fact Sheet for Healthcare Providers: https://www.woods-mathews.com/  This test is not yet approved or cleared by the [place] FDA and  has been authorized for detection  and/or diagnosis of SARS-CoV-2 by FDA under an Emergency Use Authorization (EUA). This EUA will remain  in effect (meaning this test can be used) for the duration of the COVID-19 declaration under Se ction 564(b)(1) of the Act, 21 U.S.C. section 360bbb-3(b)(1), unless the authorization is terminated or revoked sooner.  Performed at [facility], [place] [address] [place], [place] [address]   Aerobic/Anaerobic Culture (surgical/deep wound)     Status: None (Preliminary result)   Collection Time: 01/05/20 10:19 PM   Specimen: Wound  Result Value Ref Range Status   Specimen Description WOUND LEFT LEG  Final   Special Requests NONE  Final   Gram Stain   Final    FEW WBC PRESENT, PREDOMINANTLY PMN RARE GRAM NEGATIVE RODS RARE GRAM POSITIVE COCCI IN CHAINS    Culture   Final    FEW PROVIDENCIA RETTGERI FEW PSEUDOMONAS AERUGINOSA SUSCEPTIBILITIES TO FOLLOW Performed at [facility], [place] [address], [place], [place] [address]    Report Status PENDING  Incomplete         Radiology Studies: No results found.      Scheduled Meds: . docusate sodium  100 mg Oral Daily  . enoxaparin (LOVENOX) injection  40 mg Subcutaneous Daily  . ferrous sulfate  325 mg Oral BID WC  . insulin aspart  0-15 Units Subcutaneous TID WC  . insulin aspart  0-5 Units Subcutaneous QHS  . magnesium oxide  400 mg Oral Once  . pregabalin  100 mg Oral TID   Continuous Infusions: . DAPTOmycin (CUBICIN)  IV 500 mg (01/06/20 2234)  . lactated ringers Stopped (01/04/20 2002)  . lactated ringers 100 mL/hr at 01/05/20 2047  . piperacillin-tazobactam (ZOSYN)  IV 3.375 g (01/07/20 1437)     LOS: 3 days    Time spent: 25 mintues    Edwin Dada, MD [facility] 01/07/2020, 6:37 PM     Please page though [place] or Epic secure chat:  For [employer], Adult nurse

## 2020-01-07 NOTE — Plan of Care (Addendum)
Dressing changed. Patient tolerated well. This nurse was able to remove more of the gauze from the dressing change done in the ED on admission. Moderate purulent drainage. Will continue to monitor.    Problem: Education: Goal: Knowledge of General Education information will improve Description: Including pain rating scale, medication(s)/side effects and non-pharmacologic comfort measures Outcome: Progressing   Problem: Activity: Goal: Risk for activity intolerance will decrease Outcome: Progressing   Problem: Pain Managment: Goal: General experience of comfort will improve Outcome: Progressing   Problem: Safety: Goal: Ability to remain free from injury will improve Outcome: Progressing   Problem: Skin Integrity: Goal: Risk for impaired skin integrity will decrease Outcome: Progressing

## 2020-01-08 ENCOUNTER — Inpatient Hospital Stay: Payer: Self-pay

## 2020-01-08 ENCOUNTER — Inpatient Hospital Stay (HOSPITAL_COMMUNITY): Payer: Medicaid Other | Admitting: Anesthesiology

## 2020-01-08 ENCOUNTER — Encounter (HOSPITAL_COMMUNITY)
Admission: EM | Disposition: A | Discharge status: Home Health | Payer: Self-pay | Source: Home / Self Care | Attending: Internal Medicine

## 2020-01-08 ENCOUNTER — Encounter (HOSPITAL_COMMUNITY): Payer: Self-pay | Admitting: Family Medicine

## 2020-01-08 DIAGNOSIS — L88 Pyoderma gangrenosum: Secondary | ICD-10-CM | POA: Diagnosis not present

## 2020-01-08 DIAGNOSIS — E669 Obesity, unspecified: Secondary | ICD-10-CM | POA: Diagnosis not present

## 2020-01-08 DIAGNOSIS — S81802A Unspecified open wound, left lower leg, initial encounter: Secondary | ICD-10-CM

## 2020-01-08 DIAGNOSIS — E1169 Type 2 diabetes mellitus with other specified complication: Secondary | ICD-10-CM | POA: Diagnosis not present

## 2020-01-08 DIAGNOSIS — L03116 Cellulitis of left lower limb: Secondary | ICD-10-CM | POA: Diagnosis not present

## 2020-01-08 DIAGNOSIS — M86662 Other chronic osteomyelitis, left tibia and fibula: Secondary | ICD-10-CM | POA: Diagnosis not present

## 2020-01-08 HISTORY — PX: I & D EXTREMITY: SHX5045

## 2020-01-08 LAB — GLUCOSE, CAPILLARY
Glucose-Capillary: 121 mg/dL — ABNORMAL HIGH (ref 70–99)
Glucose-Capillary: 124 mg/dL — ABNORMAL HIGH (ref 70–99)
Glucose-Capillary: 175 mg/dL — ABNORMAL HIGH (ref 70–99)
Glucose-Capillary: 158 mg/dL — ABNORMAL HIGH (ref 70–99)
Glucose-Capillary: 222 mg/dL — ABNORMAL HIGH (ref 70–99)

## 2020-01-08 LAB — CBC
HCT: 31.5 % — ABNORMAL LOW (ref 36.0–46.0)
Hemoglobin: 9.5 g/dL — ABNORMAL LOW (ref 12.0–15.0)
MCH: 22.6 pg — ABNORMAL LOW (ref 26.0–34.0)
MCHC: 30.2 g/dL (ref 30.0–36.0)
MCV: 74.8 fL — ABNORMAL LOW (ref 80.0–100.0)
Platelets: 408 10*3/uL — ABNORMAL HIGH (ref 150–400)
RBC: 4.21 MIL/uL (ref 3.87–5.11)
RDW: 18 % — ABNORMAL HIGH (ref 11.5–15.5)
WBC: 8.1 10*3/uL (ref 4.0–10.5)
nRBC: 0 % (ref 0.0–0.2)

## 2020-01-08 LAB — BASIC METABOLIC PANEL
Anion gap: 13 (ref 5–15)
BUN: 10 mg/dL (ref 6–20)
CO2: 19 mmol/L — ABNORMAL LOW (ref 22–32)
Calcium: 9.2 mg/dL (ref 8.9–10.3)
Chloride: 106 mmol/L (ref 98–111)
Creatinine, Ser: 0.79 mg/dL (ref 0.44–1.00)
GFR, Estimated: >60 mL/min (ref >60)
Glucose, Bld: 116 mg/dL — ABNORMAL HIGH (ref 70–99)
Potassium: 4.2 mmol/L (ref 3.5–5.1)
Sodium: 138 mmol/L (ref 135–145)

## 2020-01-08 SURGERY — IRRIGATION AND DEBRIDEMENT EXTREMITY
Anesthesia: General | Site: Leg Lower | Laterality: Left

## 2020-01-08 MED ORDER — HEMOSTATIC AGENTS (NO CHARGE) OPTIME
TOPICAL | Status: DC | PRN
  Administered 2020-01-08: 1 via TOPICAL

## 2020-01-08 MED ORDER — CHLORHEXIDINE GLUCONATE 0.12 % MT SOLN
15.0000 mL | OROMUCOSAL | Status: AC
  Filled 2020-01-08: qty 15

## 2020-01-08 MED ORDER — OXYCODONE HCL 5 MG PO TABS
ORAL_TABLET | ORAL | Status: AC
  Filled 2020-01-08: qty 1

## 2020-01-08 MED ORDER — CHLORHEXIDINE GLUCONATE CLOTH 2 % EX PADS: 6.0000 | MEDICATED_PAD | Freq: Once | CUTANEOUS | Status: DC

## 2020-01-08 MED ORDER — DEXAMETHASONE SODIUM PHOSPHATE 10 MG/ML IJ SOLN
INTRAMUSCULAR | Status: AC
  Filled 2020-01-08: qty 1

## 2020-01-08 MED ORDER — HYDROMORPHONE HCL 1 MG/ML IJ SOLN
INTRAMUSCULAR | Status: AC
  Filled 2020-01-08: qty 1

## 2020-01-08 MED ORDER — SODIUM CHLORIDE 0.9 % IR SOLN
Status: DC | PRN
  Administered 2020-01-08: 1000 mL

## 2020-01-08 MED ORDER — PROPOFOL 10 MG/ML IV BOLUS
INTRAVENOUS | Status: DC | PRN
Start: 2020-01-08 — End: 2020-01-08
  Administered 2020-01-08: 50 mg via INTRAVENOUS
  Administered 2020-01-08: 150 mg via INTRAVENOUS
  Administered 2020-01-08: 30 mg via INTRAVENOUS

## 2020-01-08 MED ORDER — SODIUM CHLORIDE 0.9% FLUSH
10.0000 mL | INTRAVENOUS | Status: DC | PRN
  Administered 2020-01-08: 10 mL

## 2020-01-08 MED ORDER — ONDANSETRON HCL 4 MG/2ML IJ SOLN
INTRAMUSCULAR | Status: DC | PRN
  Administered 2020-01-08: 4 mg via INTRAVENOUS

## 2020-01-08 MED ORDER — FENTANYL CITRATE (PF) 250 MCG/5ML IJ SOLN
INTRAMUSCULAR | Status: AC
  Filled 2020-01-08: qty 5

## 2020-01-08 MED ORDER — HYDROMORPHONE HCL 1 MG/ML IJ SOLN
0.2500 mg | INTRAMUSCULAR | Status: DC | PRN
  Administered 2020-01-08 (×4): 0.5 mg via INTRAVENOUS

## 2020-01-08 MED ORDER — OXYCODONE HCL 5 MG/5ML PO SOLN: 5.0000 mg | Freq: Once | ORAL | Status: AC | PRN

## 2020-01-08 MED ORDER — DEXMEDETOMIDINE (PRECEDEX) IN NS 20 MCG/5ML (4 MCG/ML) IV SYRINGE
PREFILLED_SYRINGE | INTRAVENOUS | Status: DC | PRN
  Administered 2020-01-08: 4 ug via INTRAVENOUS
  Administered 2020-01-08 (×2): 8 ug via INTRAVENOUS

## 2020-01-08 MED ORDER — THROMBIN 5000 UNITS EX SOLR
CUTANEOUS | Status: DC | PRN
  Administered 2020-01-08: 5000 [IU] via TOPICAL

## 2020-01-08 MED ORDER — LIDOCAINE 2% (20 MG/ML) 5 ML SYRINGE
INTRAMUSCULAR | Status: AC
  Filled 2020-01-08: qty 5

## 2020-01-08 MED ORDER — CHLORHEXIDINE GLUCONATE CLOTH 2 % EX PADS
6.0000 | MEDICATED_PAD | Freq: Every day | CUTANEOUS | Status: DC
  Administered 2020-01-09 – 2020-01-13 (×5): 6 via TOPICAL

## 2020-01-08 MED ORDER — PROMETHAZINE HCL 25 MG/ML IJ SOLN: 6.2500 mg | INTRAMUSCULAR | Status: DC | PRN

## 2020-01-08 MED ORDER — LIDOCAINE-EPINEPHRINE 1 %-1:100000 IJ SOLN
INTRAMUSCULAR | Status: DC | PRN
  Administered 2020-01-08: 10 mL

## 2020-01-08 MED ORDER — DEXAMETHASONE SODIUM PHOSPHATE 4 MG/ML IJ SOLN
INTRAMUSCULAR | Status: DC | PRN
  Administered 2020-01-08: 4 mg via INTRAVENOUS

## 2020-01-08 MED ORDER — MIDAZOLAM HCL 2 MG/2ML IJ SOLN
INTRAMUSCULAR | Status: AC
  Filled 2020-01-08: qty 2

## 2020-01-08 MED ORDER — CHLORHEXIDINE GLUCONATE 0.12 % MT SOLN
OROMUCOSAL | Status: AC
  Administered 2020-01-08: 15 mL via OROMUCOSAL
  Filled 2020-01-08: qty 15

## 2020-01-08 MED ORDER — THROMBIN (RECOMBINANT) 5000 UNITS EX SOLR
CUTANEOUS | Status: AC
  Filled 2020-01-08: qty 5000

## 2020-01-08 MED ORDER — PREDNISONE 20 MG PO TABS
60.0000 mg | ORAL_TABLET | Freq: Every day | ORAL | Status: DC
  Administered 2020-01-08 – 2020-01-13 (×6): 60 mg via ORAL
  Filled 2020-01-08 (×6): qty 3

## 2020-01-08 MED ORDER — SUCCINYLCHOLINE CHLORIDE 200 MG/10ML IV SOSY
PREFILLED_SYRINGE | INTRAVENOUS | Status: DC | PRN
  Administered 2020-01-08: 160 mg via INTRAVENOUS

## 2020-01-08 MED ORDER — SODIUM CHLORIDE 0.9 % IV SOLN
2.0000 g | Freq: Three times a day (TID) | INTRAVENOUS | Status: DC
  Administered 2020-01-08 – 2020-01-13 (×16): 2 g via INTRAVENOUS
  Filled 2020-01-08 (×17): qty 2

## 2020-01-08 MED ORDER — PHENYLEPHRINE 40 MCG/ML (10ML) SYRINGE FOR IV PUSH (FOR BLOOD PRESSURE SUPPORT)
PREFILLED_SYRINGE | INTRAVENOUS | Status: DC | PRN
  Administered 2020-01-08 (×3): 80 ug via INTRAVENOUS

## 2020-01-08 MED ORDER — LIDOCAINE 2% (20 MG/ML) 5 ML SYRINGE
INTRAMUSCULAR | Status: DC | PRN
  Administered 2020-01-08: 100 mg via INTRAVENOUS

## 2020-01-08 MED ORDER — DEXMEDETOMIDINE (PRECEDEX) IN NS 20 MCG/5ML (4 MCG/ML) IV SYRINGE
PREFILLED_SYRINGE | INTRAVENOUS | Status: AC
  Filled 2020-01-08: qty 5

## 2020-01-08 MED ORDER — DEXTROSE 5 % IV SOLN
3.0000 g | INTRAVENOUS | Status: AC
  Administered 2020-01-08: 2 g via INTRAVENOUS
  Filled 2020-01-08 (×2): qty 3000

## 2020-01-08 MED ORDER — THROMBIN (RECOMBINANT) 20000 UNITS EX SOLR
CUTANEOUS | Status: AC
  Filled 2020-01-08: qty 20000

## 2020-01-08 MED ORDER — CHLORHEXIDINE GLUCONATE CLOTH 2 % EX PADS
6.0000 | MEDICATED_PAD | Freq: Once | CUTANEOUS | Status: AC
  Administered 2020-01-08: 6 via TOPICAL

## 2020-01-08 MED ORDER — SODIUM CHLORIDE 0.9 % IV SOLN
INTRAVENOUS | Status: AC
  Administered 2020-01-08: 500 mL
  Filled 2020-01-08: qty 10

## 2020-01-08 MED ORDER — MIDAZOLAM HCL 5 MG/5ML IJ SOLN
INTRAMUSCULAR | Status: DC | PRN
  Administered 2020-01-08: 2 mg via INTRAVENOUS

## 2020-01-08 MED ORDER — LIDOCAINE-EPINEPHRINE 1 %-1:100000 IJ SOLN
INTRAMUSCULAR | Status: AC
  Filled 2020-01-08: qty 1

## 2020-01-08 MED ORDER — LACTATED RINGERS IV SOLN: INTRAVENOUS | Status: DC

## 2020-01-08 MED ORDER — SUCCINYLCHOLINE CHLORIDE 200 MG/10ML IV SOSY
PREFILLED_SYRINGE | INTRAVENOUS | Status: AC
  Filled 2020-01-08: qty 10

## 2020-01-08 MED ORDER — MIDAZOLAM HCL 2 MG/2ML IJ SOLN: 0.5000 mg | Freq: Once | INTRAMUSCULAR | Status: DC | PRN

## 2020-01-08 MED ORDER — ONDANSETRON HCL 4 MG/2ML IJ SOLN
INTRAMUSCULAR | Status: AC
  Filled 2020-01-08: qty 2

## 2020-01-08 MED ORDER — OXYCODONE HCL 5 MG PO TABS
5.0000 mg | ORAL_TABLET | Freq: Once | ORAL | Status: AC | PRN
  Administered 2020-01-08: 5 mg via ORAL

## 2020-01-08 MED ORDER — MEPERIDINE HCL 25 MG/ML IJ SOLN: 6.2500 mg | INTRAMUSCULAR | Status: DC | PRN

## 2020-01-08 MED ORDER — FENTANYL CITRATE (PF) 100 MCG/2ML IJ SOLN
INTRAMUSCULAR | Status: DC | PRN
  Administered 2020-01-08 (×8): 50 ug via INTRAVENOUS
  Administered 2020-01-08: 100 ug via INTRAVENOUS

## 2020-01-08 MED ORDER — CYCLOBENZAPRINE HCL 5 MG PO TABS
5.0000 mg | ORAL_TABLET | Freq: Every evening | ORAL | Status: DC | PRN
  Administered 2020-01-09 – 2020-01-11 (×3): 5 mg via ORAL
  Filled 2020-01-08 (×3): qty 1

## 2020-01-08 MED ORDER — SODIUM CHLORIDE 0.9% FLUSH
10.0000 mL | Freq: Two times a day (BID) | INTRAVENOUS | Status: DC
  Administered 2020-01-08 – 2020-01-13 (×5): 10 mL

## 2020-01-08 SURGICAL SUPPLY — 62 items
AGENT HMST 10 BLLW SHRT CANN (HEMOSTASIS) ×1
APL PRP STRL LF DISP 70% ISPRP (MISCELLANEOUS)
BLADE CLIPPER SURG (BLADE) IMPLANT
BNDG ELASTIC 4X5.8 VLCR STR LF (GAUZE/BANDAGES/DRESSINGS) ×2 IMPLANT
BNDG GAUZE ELAST 4 BULKY (GAUZE/BANDAGES/DRESSINGS) ×2 IMPLANT
CANISTER SUCT 3000ML PPV (MISCELLANEOUS) ×2 IMPLANT
CANISTER WOUND CARE 500ML ATS (WOUND CARE) ×1 IMPLANT
CHLORAPREP W/TINT 26 (MISCELLANEOUS) IMPLANT
COVER SURGICAL LIGHT HANDLE (MISCELLANEOUS) ×2 IMPLANT
COVER WAND RF STERILE (DRAPES) ×1 IMPLANT
DECANTER SPIKE VIAL GLASS SM (MISCELLANEOUS) ×1 IMPLANT
DRAPE HALF SHEET 40X57 (DRAPES) IMPLANT
DRAPE INCISE IOBAN 66X45 STRL (DRAPES) IMPLANT
DRAPE ORTHO SPLIT 77X108 STRL (DRAPES) ×4
DRAPE SURG ORHT 6 SPLT 77X108 (DRAPES) IMPLANT
DRESSING HYDROCOLLOID 4X4 (GAUZE/BANDAGES/DRESSINGS) ×1 IMPLANT
DRSG ADAPTIC 3X8 NADH LF (GAUZE/BANDAGES/DRESSINGS) IMPLANT
DRSG CUTIMED SORBACT 7X9 (GAUZE/BANDAGES/DRESSINGS) ×1 IMPLANT
DRSG PAD ABDOMINAL 8X10 ST (GAUZE/BANDAGES/DRESSINGS) IMPLANT
DRSG TELFA 3X8 NADH (GAUZE/BANDAGES/DRESSINGS) ×2 IMPLANT
DRSG VAC ATS LRG SENSATRAC (GAUZE/BANDAGES/DRESSINGS) ×1 IMPLANT
DRSG VAC ATS MED SENSATRAC (GAUZE/BANDAGES/DRESSINGS) IMPLANT
DRSG VAC ATS SM SENSATRAC (GAUZE/BANDAGES/DRESSINGS) IMPLANT
ELECT CAUTERY BLADE 6.4 (BLADE) ×2 IMPLANT
ELECT REM PT RETURN 9FT ADLT (ELECTROSURGICAL) ×2
ELECTRODE REM PT RTRN 9FT ADLT (ELECTROSURGICAL) ×1 IMPLANT
GAUZE SPONGE 4X4 12PLY STRL (GAUZE/BANDAGES/DRESSINGS) ×2 IMPLANT
GEL ULTRASOUND 20GR AQUASONIC (MISCELLANEOUS) IMPLANT
GLOVE BIO SURGEON STRL SZ 6.5 (GLOVE) ×4 IMPLANT
GLOVE BIOGEL M STRL SZ7.5 (GLOVE) ×2 IMPLANT
GLOVE INDICATOR 8.0 STRL GRN (GLOVE) ×2 IMPLANT
GOWN STRL REUS W/ TWL LRG LVL3 (GOWN DISPOSABLE) ×3 IMPLANT
GOWN STRL REUS W/TWL LRG LVL3 (GOWN DISPOSABLE) ×6
HANDPIECE INTERPULSE COAX TIP (DISPOSABLE)
HEMOSTAT HEMOBLAST BELLOWS (HEMOSTASIS) ×1 IMPLANT
IMPL STRAVIX 3X6 (Tissue) IMPLANT
IMPLANT STRAVIX 3X6 (Tissue) ×4 IMPLANT
KIT BASIN OR (CUSTOM PROCEDURE TRAY) ×2 IMPLANT
KIT TURNOVER KIT B (KITS) ×2 IMPLANT
MATRIX WOUND 3-LAYER 10X15 (Tissue) ×1 IMPLANT
MATRIX WOUND 3-LAYER 7X10 (Tissue) ×1 IMPLANT
MICROMATRIX 1000MG (Tissue) ×4 IMPLANT
NS IRRIG 1000ML POUR BTL (IV SOLUTION) ×2 IMPLANT
PACK GENERAL/GYN (CUSTOM PROCEDURE TRAY) ×2 IMPLANT
PAD ABD 8X10 STRL (GAUZE/BANDAGES/DRESSINGS) ×2 IMPLANT
PAD ARMBOARD 7.5X6 YLW CONV (MISCELLANEOUS) ×4 IMPLANT
PAD DRESSING TELFA 3X8 NADH (GAUZE/BANDAGES/DRESSINGS) IMPLANT
PAD NEG PRESSURE SENSATRAC (MISCELLANEOUS) IMPLANT
SET HNDPC FAN SPRY TIP SCT (DISPOSABLE) IMPLANT
SOLUTION PARTIC MCRMTRX 1000MG (Tissue) IMPLANT
STAPLER VISISTAT 35W (STAPLE) IMPLANT
STOCKINETTE IMPERVIOUS 9X36 MD (GAUZE/BANDAGES/DRESSINGS) ×1 IMPLANT
STOCKINETTE IMPERVIOUS LG (DRAPES) IMPLANT
SURGILUBE 2OZ TUBE FLIPTOP (MISCELLANEOUS) ×1 IMPLANT
SUT SILK 4 0 P 3 (SUTURE) IMPLANT
SUT SILK 4 0 PS 2 (SUTURE) IMPLANT
SUT VIC AB 5-0 PS2 18 (SUTURE) ×1 IMPLANT
TOWEL GREEN STERILE (TOWEL DISPOSABLE) ×2 IMPLANT
TOWEL GREEN STERILE FF (TOWEL DISPOSABLE) IMPLANT
TUBE CONNECTING 12X1/4 (SUCTIONS) ×3 IMPLANT
UNDERPAD 30X36 HEAVY ABSORB (UNDERPADS AND DIAPERS) IMPLANT
YANKAUER SUCT BULB TIP NO VENT (SUCTIONS) ×2 IMPLANT

## 2020-01-08 NOTE — Progress Notes (Signed)
Pharmacy Antibiotic Note  [name] is a 34 y.o. female admitted on 01/04/2020 with cellulitis and myositis. Patient with chronic LLE d/t pyoderma gangrenosum; MRI negative for osteomyelitis or abscess.  Pharmacy has been consulted for cefepime dosing.   WBC WNL, afebrile. Has previously grown E. Faecalis, Pseudomonas, Enterobacter and Peptostreptococcal spp in wound. Wound cx here growing cefepime sensitive Providencia and Pseudomonas. In OR today with plastic surgery. Follow-up further OR cultures, clinical progression and renal function. Will start cefepime 2g q8hr per renal function.   Plan: Discontinue piperacillin/tazobactam Initiate cefepime 2g q8hr  Height: 5\' 8"  (172.7 cm) Weight: 121.2 kg (267 lb 4.8 oz) IBW/kg (Calculated) : 63.9  Temp (24hrs), Avg:98.6 F (37 C), Min:98.4 F (36.9 C), Max:98.8 F (37.1 C)  Recent Labs  Lab 01/04/20 1746 01/04/20 2052 01/05/20 0111 01/05/20 0525 01/06/20 0426 01/07/20 0350 01/07/20 0502 01/08/20 0158  WBC 11.1*  --  10.2 6.9 7.7  --  7.8 8.1  CREATININE 0.89  --  0.75 0.76 0.66 0.76  --  0.79  LATICACIDVEN 3.1* 1.0  --   --   --   --   --   --     Estimated Creatinine Clearance: 133.2 mL/min (by C-G formula based on SCr of 0.79 mg/dL).    Allergies  Allergen Reactions  . Carbamazepine Other (See Comments)    Causes aplastic anemia Other reaction(s): Other (See Comments) Causes aplastic anemia "TEGRETOL"   . Zolpidem Tartrate Rash    "AMBIEN"     Antimicrobials this admission: 1/1 vanc >> 1/3 1/2 pip/tazo >> 1/5 1/3 dapto >> 1/5 1/5 cefepime >>  Microbiology results: 1/2 wound cx: cefepime-sensitive Pseudomonas and Providencia  1/1 bcx: ngtd   Thank you for allowing pharmacy to be a part of this patient's care.  03/07/20, PharmD PGY2 ID Pharmacy Resident [phone]  01/08/2020 1:34 PM

## 2020-01-08 NOTE — Progress Notes (Signed)
PROGRESS NOTE  [name] TDS:[id number] DOB: Mar 09, 1985 DOA: 01/04/2020 PCP: Patient, No Pcp Per  HPI/Recap of past 24 hours: HPI from Dr Maryfrances Bunnell [name] is a 35 y.o. F with history of type 2 diabetes on oral hypoglycemics, ?RA,  pyoderma gangrenosum and chronic left leg wound with polymicrobial infection presents for evaluation of fever, worsening leg pain and purulent drainage. Unfortunately she had worsening left leg wound for last 3 years, poor outpatient support and follow-up, multiple previous interventions including hydrotherapy and last seen at wound care clinic 1 week ago presents to the ER because it is started smelling foul and draining purulent. Reported temperature 102 at home. In the ED, afebrile but tachycardic. Lactic acid 3.1. Patient also on Metformin at home. Treated with IV fluids and antibiotics with sepsis protocol and admitted to the hospital.    Today, patient denied any new complaints, continues to reports left leg/foot spasm.      Assessment/Plan: Principal Problem:   Chronic osteomyelitis involving lower leg, left (HCC) Active Problems:   Wound infection   Diabetes mellitus type 2 in obese (HCC)   Obesity, Class III, BMI 40-49.9 (morbid obesity) (HCC)   Chronic osteomyelitis of left lower leg (HCC)   Pyoderma gangrenosa   Cellulitis of left lower extremity   Acute on chronic left leg cellulitis and myositis in the setting of pyoderma gangrenosum Currently afebrile, with no leukocytosis MRI with no evidence of osteo, or abscess Wound culture growing Pseudomonas, providentia Appreciate plastic on board, plan for debridement on 01/08/2020, discussed with Dr. Ulice Bold, recommend starting patient on steroids for pyoderma gangrenosum Start prednisone 60 mg daily Appreciate ID on board- D/C Zosyn, switch to cefepime as per ID Pain management Monitor closely  Diabetes mellitus type 2 A1c 6.7, controlled Continue SSI, Accu-Cheks, hypoglycemic  protocol At home for [employer], Metformin  Microcytic anemia/iron deficiency anemia Anemia panel sats 6%, iron 18 Continue oral iron supplementation  ??History of rheumatoid arthritis Was on Humira, stopped due to insurance issues Plan to restart Humira once able/approved by insurance  Morbid obesity Lifestyle modification advised       Malnutrition Type:      Malnutrition Characteristics:      Nutrition Interventions:       Estimated body mass index is 40.64 kg/m as calculated from the following:   Height as of this encounter: 5\' 8"  (1.727 m).   Weight as of this encounter: 121.2 kg.     Code Status: Full  Family Communication: Discussed with patient  Disposition Plan: Status is: Inpatient  Remains inpatient appropriate because:Inpatient level of care appropriate due to severity of illness   Dispo:  Patient From: Home  Planned Disposition: Home with Health Care Svc  Expected discharge date: 01/13/2020  Medically stable for discharge: No    Consultants:  Plastic surgery  ID  Procedures:  S/p debridement on 01/08/2020  Antimicrobials:  Zosyn  DVT prophylaxis: Lovenox   Objective: Vitals:   01/07/20 2339 01/08/20 0415 01/08/20 1100 01/08/20 1152  BP:  101/61 (!) 114/58   Pulse:  66 67   Resp:  18    Temp:  98.6 F (37 C) 98.8 F (37.1 C)   TempSrc:  Oral Oral   SpO2:  100% 100%   Weight: 121.2 kg   121.2 kg  Height:    5\' 8"  (1.727 m)    Intake/Output Summary (Last 24 hours) at 01/08/2020 1304 Last data filed at 01/08/2020 1247 Gross per 24 hour  Intake 540 ml  Output --  Net 540 ml   Filed Weights   01/04/20 2228 01/07/20 2339 01/08/20 1152  Weight: 123.8 kg 121.2 kg 121.2 kg    Exam:  General: NAD   Cardiovascular: S1, S2 present  Respiratory: CTAB  Abdomen: Soft, nontender, nondistended, bowel sounds present  Musculoskeletal: No bilateral pedal edema noted, left leg in Ace wrap, C/D/I  Skin: Normal  Psychiatry:  Normal mood    Data Reviewed: CBC: Recent Labs  Lab 01/04/20 1746 01/05/20 0111 01/05/20 0525 01/06/20 0426 01/07/20 0502 01/08/20 0158  WBC 11.1* 10.2 6.9 7.7 7.8 8.1  NEUTROABS 6.6  --   --  4.2  --   --   HGB 9.6* 9.1* 10.5* 8.1* 8.7* 9.5*  HCT 33.4* 31.0* 35.2* 27.4* 26.8* 31.5*  MCV 79.3* 77.7* 77.7* 76.5* 71.8* 74.8*  PLT 364 357 283 325 285 [id number]*   Basic Metabolic Panel: Recent Labs  Lab 01/04/20 1746 01/05/20 0111 01/05/20 0525 01/06/20 0426 01/07/20 0350 01/08/20 0158  NA 138  --  139 139 139 138  K 3.1*  --  3.2* 4.0 4.6 4.2  CL 106  --  108 109 109 106  CO2 18*  --  19* 18* 17* 19*  GLUCOSE 179*  --  120* 112* 115* 116*  BUN 7  --  7 7 8 10   CREATININE 0.89 0.75 0.76 0.66 0.76 0.79  CALCIUM 8.9  --  8.2* 8.7* 8.7* 9.2  MG  --   --   --  2.0  --   --   PHOS  --   --   --  3.1  --   --    GFR: Estimated Creatinine Clearance: 133.2 mL/min (by C-G formula based on SCr of 0.79 mg/dL). Liver Function Tests: Recent Labs  Lab 01/04/20 1746  AST 24  ALT 41  ALKPHOS 133*  BILITOT 0.5  PROT 7.0  ALBUMIN 3.1*   No results for input(s): LIPASE, AMYLASE in the last 168 hours. No results for input(s): AMMONIA in the last 168 hours. Coagulation Profile: Recent Labs  Lab 01/04/20 1746  INR 1.1   Cardiac Enzymes: Recent Labs  Lab 01/06/20 0426  CKTOTAL 50   BNP (last 3 results) No results for input(s): PROBNP in the last 8760 hours. HbA1C: No results for input(s): HGBA1C in the last 72 hours. CBG: Recent Labs  Lab 01/07/20 1147 01/07/20 1726 01/07/20 2119 01/08/20 0700 01/08/20 1124  GLUCAP 130* 111* 118* 121* 124*   Lipid Profile: No results for input(s): CHOL, HDL, LDLCALC, TRIG, CHOLHDL, LDLDIRECT in the last 72 hours. Thyroid Function Tests: No results for input(s): TSH, T4TOTAL, FREET4, T3FREE, THYROIDAB in the last 72 hours. Anemia Panel: Recent Labs    01/07/20 0350  FERRITIN 31  TIBC 315  IRON 18*   Urine analysis:     Component Value Date/Time   COLORURINE YELLOW 01/04/2020 2233   APPEARANCEUR CLEAR 01/04/2020 2233   LABSPEC 1.013 01/04/2020 2233   PHURINE 6.0 01/04/2020 2233   GLUCOSEU NEGATIVE 01/04/2020 2233   HGBUR NEGATIVE 01/04/2020 2233   BILIRUBINUR NEGATIVE 01/04/2020 2233   KETONESUR NEGATIVE 01/04/2020 2233   PROTEINUR NEGATIVE 01/04/2020 2233   NITRITE NEGATIVE 01/04/2020 2233   LEUKOCYTESUR NEGATIVE 01/04/2020 2233   Sepsis Labs: @LABRCNTIP (procalcitonin:4,lacticidven:4)  ) Recent Results (from the past 240 hour(s))  Culture, blood (Routine x 2)     Status: None (Preliminary result)   Collection Time: 01/04/20  5:47 PM   Specimen: BLOOD  Result Value Ref Range  Status   Specimen Description BLOOD SITE NOT SPECIFIED  Final   Special Requests   Final    BOTTLES DRAWN AEROBIC AND ANAEROBIC Blood Culture adequate volume   Culture   Final    NO GROWTH 4 DAYS Performed at [facility], [address], [place], [place] [address]    Report Status PENDING  Incomplete  Culture, blood (Routine x 2)     Status: None (Preliminary result)   Collection Time: 01/04/20  8:52 PM   Specimen: BLOOD  Result Value Ref Range Status   Specimen Description BLOOD LEFT ANTECUBITAL  Final   Special Requests   Final    BOTTLES DRAWN AEROBIC AND ANAEROBIC Blood Culture adequate volume   Culture   Final    NO GROWTH 4 DAYS Performed at [facility], [place] [address], [place], [place] [address]    Report Status PENDING  Incomplete  SARS CORONAVIRUS 2 (TAT 6-24 HRS) Nasopharyngeal Nasopharyngeal Swab     Status: None   Collection Time: 01/04/20  9:56 PM   Specimen: Nasopharyngeal Swab  Result Value Ref Range Status   SARS Coronavirus 2 NEGATIVE NEGATIVE Final    Comment: (NOTE) SARS-CoV-2 target nucleic acids are NOT DETECTED.  The SARS-CoV-2 RNA is generally detectable in upper and lower respiratory specimens during the acute phase of infection. Negative results do not preclude  SARS-CoV-2 infection, do not rule out co-infections with other pathogens, and should not be used as the sole basis for treatment or other patient management decisions. Negative results must be combined with clinical observations, patient history, and epidemiological information. The expected result is Negative.  Fact Sheet for Patients: SugarRoll.be  Fact Sheet for Healthcare Providers: https://www.woods-mathews.com/  This test is not yet approved or cleared by the [place] FDA and  has been authorized for detection and/or diagnosis of SARS-CoV-2 by FDA under an Emergency Use Authorization (EUA). This EUA will remain  in effect (meaning this test can be used) for the duration of the COVID-19 declaration under Se ction 564(b)(1) of the Act, 21 U.S.C. section 360bbb-3(b)(1), unless the authorization is terminated or revoked sooner.  Performed at [facility], [place] [address] [place], [place] [address]   Aerobic/Anaerobic Culture (surgical/deep wound)     Status: None (Preliminary result)   Collection Time: 01/05/20 10:19 PM   Specimen: Wound  Result Value Ref Range Status   Specimen Description WOUND LEFT LEG  Final   Special Requests NONE  Final   Gram Stain   Final    FEW WBC PRESENT, PREDOMINANTLY PMN RARE GRAM NEGATIVE RODS RARE GRAM POSITIVE COCCI IN CHAINS    Culture   Final    FEW PROVIDENCIA RETTGERI FEW PSEUDOMONAS AERUGINOSA SUSCEPTIBILITIES TO FOLLOW    Report Status PENDING  Incomplete   Organism ID, Bacteria PROVIDENCIA RETTGERI  Final   Organism ID, Bacteria PSEUDOMONAS AERUGINOSA  Final      Susceptibility   Pseudomonas aeruginosa - MIC*    CEFTAZIDIME <=1 SENSITIVE Sensitive     CIPROFLOXACIN >=4 RESISTANT Resistant     GENTAMICIN <=1 SENSITIVE Sensitive     IMIPENEM 1 SENSITIVE Sensitive     CEFEPIME 1 SENSITIVE Sensitive     * FEW PSEUDOMONAS AERUGINOSA   Providencia rettgeri - MIC*    AMPICILLIN  >=32 RESISTANT Resistant     CEFAZOLIN >=64 RESISTANT Resistant     CEFEPIME 1 SENSITIVE Sensitive     CEFTAZIDIME <=1 SENSITIVE Sensitive     CIPROFLOXACIN  INTERMEDIATE Intermediate     GENTAMICIN <=1 SENSITIVE Sensitive     IMIPENEM 1 SENSITIVE Sensitive     TRIMETH/SULFA <=20 SENSITIVE Sensitive     AMPICILLIN/SULBACTAM <=2 SENSITIVE Sensitive     PIP/TAZO Value in next row Sensitive      <=4 SENSITIVEPerformed at [facility], [address], [place], [place] [address]    * FEW PROVIDENCIA RETTGERI      Studies: No results found.  Scheduled Meds: . ceFAZolin 1 g / gentamicin 80 mg in NS 500 mL surgical irrigation   Irrigation To OR  . Chlorhexidine Gluconate Cloth  6 each Topical Once  . [MAR Hold] docusate sodium  100 mg Oral Daily  . [MAR Hold] enoxaparin (LOVENOX) injection  40 mg Subcutaneous Daily  . [MAR Hold] ferrous sulfate  325 mg Oral BID WC  . [MAR Hold] insulin aspart  0-15 Units Subcutaneous TID WC  . [MAR Hold] insulin aspart  0-5 Units Subcutaneous QHS  . [MAR Hold] magnesium oxide  400 mg Oral Once  . [MAR Hold] predniSONE  60 mg Oral Q breakfast  . [MAR Hold] pregabalin  100 mg Oral TID    Continuous Infusions: . [MAR Hold] lactated ringers Stopped (01/04/20 2002)  . lactated ringers 100 mL/hr at 01/08/20 1221  . lactated ringers 10 mL/hr at 01/08/20 1201  . [MAR Hold] piperacillin-tazobactam (ZOSYN)  IV 3.375 g (01/08/20 4765)     LOS: 4 days     Briant Cedar, MD [facility]  If 7PM-7AM, please contact night-coverage [email or web address] 01/08/2020, 1:04 PM

## 2020-01-08 NOTE — Anesthesia Postprocedure Evaluation (Signed)
Anesthesia Post Note  Patient: [name]  Procedure(s) Performed: Excision of leg wound with Grafix placement (Left Leg Lower)     Patient location during evaluation: PACU Anesthesia Type: General Level of consciousness: awake and alert Pain management: pain level controlled (more comfortable now) Vital Signs Assessment: post-procedure vital signs reviewed and stable Respiratory status: spontaneous breathing, nonlabored ventilation and respiratory function stable Cardiovascular status: blood pressure returned to baseline and stable Postop Assessment: no apparent nausea or vomiting Anesthetic complications: no   No complications documented.  Last Vitals:  Vitals:   01/08/20 1415 01/08/20 1441  BP:  135/67  Pulse: 94 85  Resp: (!) 23 19  Temp:    SpO2: 96% 96%    Last Pain:  Vitals:   01/08/20 1415  TempSrc:   PainSc: 9                  Daizy Outen,E. Marylou Wages

## 2020-01-08 NOTE — Anesthesia Procedure Notes (Signed)
Procedure Name: Intubation Date/Time: 01/08/2020 12:29 PM Performed by: Montez Morita, Lova Urbieta W, CRNA Pre-anesthesia Checklist: Patient identified, Emergency Drugs available, Suction available and Patient being monitored Patient Re-evaluated:Patient Re-evaluated prior to induction Oxygen Delivery Method: Circle system utilized Preoxygenation: Pre-oxygenation with 100% oxygen Induction Type: IV induction Ventilation: Mask ventilation without difficulty Laryngoscope Size: Miller and 2 Grade View: Grade I Tube type: Oral Tube size: 7.0 mm Number of attempts: 1 Airway Equipment and Method: Stylet Placement Confirmation: ETT inserted through vocal cords under direct vision,  positive ETCO2 and breath sounds checked- equal and bilateral Secured at: 22 cm Tube secured with: Tape Dental Injury: Teeth and Oropharynx as per pre-operative assessment

## 2020-01-08 NOTE — Progress Notes (Signed)
[place] for Infectious Disease  Date of Admission:  01/04/2020           Reason for visit: Follow up on wound infection  Current antibiotics: Dapto Zosyn  ASSESSMENT:    # cellulitis and myositis left lower extremity 2/2 Providencia rettgeri and PsA in setting of PG # hep c Ab positive, RNA negative # DM  PLAN:    -- continue pip-tazo pending susceptibilities. Will likely change to cefepime vs carbapenem due to potential for inducible Amp C with providencia spp (although not considered clinically high risk for this as compared to Enterobacter, Citrobacter, or Kleb aerogenes) -- planning for OR today with Dr Marla Roe and will d/w her regarding getting more cultures -- DC daptomycin since no GPC isolated -- wound care, glycemic control  MEDICATIONS:    Scheduled Meds: . Chlorhexidine Gluconate Cloth  6 each Topical Once  . docusate sodium  100 mg Oral Daily  . enoxaparin (LOVENOX) injection  40 mg Subcutaneous Daily  . ferrous sulfate  325 mg Oral BID WC  . insulin aspart  0-15 Units Subcutaneous TID WC  . insulin aspart  0-5 Units Subcutaneous QHS  . magnesium oxide  400 mg Oral Once  . pregabalin  100 mg Oral TID    Continuous Infusions: . DAPTOmycin (CUBICIN)  IV 500 mg (01/07/20 2250)  . lactated ringers Stopped (01/04/20 2002)  . lactated ringers 100 mL/hr at 01/05/20 2047  . piperacillin-tazobactam (ZOSYN)  IV 3.375 g (01/08/20 0623)    PRN Meds: acetaminophen **OR** acetaminophen, HYDROmorphone (DILAUDID) injection, magnesium oxide, ondansetron **OR** ondansetron (ZOFRAN) IV, oxyCODONE-acetaminophen, senna-docusate   INTERVAL EVENTS:   No acute events  SUBJECTIVE:   No complaints.  Knows she is going to OR today.  Review of Systems  Constitutional: Negative for chills and fever.  Respiratory: Negative.   Cardiovascular: Negative.   Gastrointestinal: Negative.   Genitourinary: Negative.   Skin:       + wound.   All other systems  reviewed and are negative.    OBJECTIVE:   Allergies  Allergen Reactions  . Carbamazepine Other (See Comments)    Causes aplastic anemia Other reaction(s): Other (See Comments) Causes aplastic anemia "TEGRETOL"   . Zolpidem Tartrate Rash    "AMBIEN"     Blood pressure 101/61, pulse 66, temperature 98.6 F (37 C), temperature source Oral, resp. rate 18, height 5\' 8"  (1.727 m), weight 121.2 kg, SpO2 100 %. Body mass index is 40.64 kg/m.  Physical Exam Constitutional:      General: She is not in acute distress.    Appearance: Normal appearance.  Pulmonary:     Effort: Pulmonary effort is normal. No respiratory distress.  Musculoskeletal:     Comments: Left leg is wrapped in Ace bandage.   Neurological:     General: No focal deficit present.     Mental Status: She is alert and oriented to person, place, and time.  Psychiatric:        Mood and Affect: Mood normal.        Behavior: Behavior normal.     Lab Results & Microbiology Lab Results  Component Value Date   WBC 8.1 01/08/2020   HGB 9.5 (L) 01/08/2020   HCT 31.5 (L) 01/08/2020   MCV 74.8 (L) 01/08/2020   PLT 408 (H) 01/08/2020    Lab Results  Component Value Date   NA 138 01/08/2020   K 4.2 01/08/2020   CO2 19 (L) 01/08/2020  GLUCOSE 116 (H) 01/08/2020   BUN 10 01/08/2020   CREATININE 0.79 01/08/2020   CALCIUM 9.2 01/08/2020   GFRNONAA >60 01/08/2020   GFRAA >60 01/20/2017    Lab Results  Component Value Date   ALT 41 01/04/2020   AST 24 01/04/2020   ALKPHOS 133 (H) 01/04/2020   BILITOT 0.5 01/04/2020     I have reviewed the micro and lab results in Epic.  Imaging No results found.      Vedia Coffer for Infectious Disease [facility] [phone] pager 01/08/2020, 7:09 AM

## 2020-01-08 NOTE — Transfer of Care (Signed)
Immediate Anesthesia Transfer of Care Note  Patient: [name]  Procedure(s) Performed: Excision of leg wound with Grafix placement (Left Leg Lower)  Patient Location: PACU  Anesthesia Type:General  Level of Consciousness: awake  Airway & Oxygen Therapy: Patient Spontanous Breathing and Patient connected to face mask oxygen  Post-op Assessment: Report given to RN and Post -op Vital signs reviewed and stable  Post vital signs: Reviewed and stable  Last Vitals:  Vitals Value Taken Time  BP 128/70 01/08/20 1340  Temp    Pulse 95 01/08/20 1341  Resp 15 01/08/20 1341  SpO2 100 % 01/08/20 1341  Vitals shown include unvalidated device data.  Last Pain:  Vitals:   01/08/20 1100  TempSrc: Oral  PainSc:       Patients Stated Pain Goal: 3 (01/08/20 6168)  Complications: No complications documented.

## 2020-01-08 NOTE — Plan of Care (Signed)
?  Problem: Clinical Measurements: ?Goal: Ability to maintain clinical measurements within normal limits will improve ?Outcome: Progressing ?Goal: Will remain free from infection ?Outcome: Progressing ?Goal: Diagnostic test results will improve ?Outcome: Progressing ?  ?

## 2020-01-08 NOTE — Op Note (Signed)
DATE OF OPERATION: 01/08/2020  LOCATION: [facility] In[facility]  PREOPERATIVE DIAGNOSIS: Left leg wound  POSTOPERATIVE DIAGNOSIS: Same  PROCEDURE:  1. Excision of left leg wound 14 x 16 x 3 cm skin, soft tissue and muscle 2. Placement of Stravix 2 x 6 cm x 2 sheets 3. Placement of acell powder 7 x 10 cm and 10 x 15 cm sheet and 2 gm powder 4. VAC sponge placement with 75 mmHg pressure  SURGEON: Kolbie Lepkowski [employer], DO  ASSISTANT: Joni Fears, PA  EBL: 20 cc  CONDITION: Stable  COMPLICATIONS: None  INDICATION: The patient, [name], is a 35 y.o. female born on Sep 09, 1985, is here for treatment of a chronic left leg wound.  There is concern for pyoderma gangrenosum.   PROCEDURE DETAILS:  The patient was seen prior to surgery and marked.  The IV antibiotics were given. The patient was taken to the operating room and given a general anesthetic. A standard time out was performed and all information was confirmed by those in the room. SCD was placed on the right leg.  The left leg was prepped and draped.  Local with epinephrine was injected as a superficial block.   The #10 blade and tissue scissors were used to excise the nonviable soft tissue of the 14 x 16 cm wound of the left leg.  The curette was used additionally at the base of the wound.  The area was irrigated with saline.  Cultures were obtained and two specimens were sent of the distal medial and distal lateral leg wound. The wound was then irrigated with antibiotic solution.  The thrombin and hemoblast was used for improved hemostasis.  All of the stravix was placed medially and secured with the 5-0 Vicryl.  All of the acell powder and sheet was applied anteriorly, laterally and posteriorly.  It was secured with the 5-0 Vicryl.  The sorbact was applied and KY gel placed.  The VAC was placed and a secure dressing / seal was obtained.  The leg was wrapped with kerlex and an Ace wrap.  The patient was allowed to  wake up and taken to recovery room in stable condition at the end of the case. The family was notified at the end of the case.   The advanced practice practitioner (APP) assisted throughout the case.  The APP was essential in retraction and counter traction when needed to make the case progress smoothly.  This retraction and assistance made it possible to see the tissue plans for the procedure.  The assistance was needed for blood control, tissue re-approximation and assisted with closure of the incision site.

## 2020-01-08 NOTE — Plan of Care (Signed)

## 2020-01-08 NOTE — TOC Initial Note (Signed)
Transition of Care [facility]) - Initial/Assessment Note    Patient Details  Name: [name] MRN: [id number] Date of Birth: August 15, 1985  Transition of Care [facility]) CM/SW Contact:    Benard Halsted, LCSW Phone Number: 01/08/2020, 4:10 PM  Clinical Narrative:                 CSW following for Home health needs and sent referral to [facility]. However, they are currently unsure that they will have adequate staffing for an RN. CSW contacted [facility] in the event patient will need IV antibiotics at home. CSW requested PS MD sign [employer] wound vac forms on patient's hard chart tomorrow.   Expected Discharge Plan: [place] Barriers to Discharge: Continued Medical Work up   Patient Goals and CMS Choice   CMS Medicare.gov Compare Post Acute Care list provided to:: Patient Choice offered to / list presented to : Patient  Expected Discharge Plan and Services Expected Discharge Plan: [place]   Discharge Planning Services: CM Consult Post Acute Care Choice: Home Health                                        Prior Living Arrangements/Services   Lives with:: Self Patient language and need for interpreter reviewed:: Yes Do you feel safe going back to the place where you live?: Yes      Need for Family Participation in Patient Care: No (Comment) Care giver support system in place?: Yes (comment)   Criminal Activity/Legal Involvement Pertinent to Current Situation/Hospitalization: No - Comment as needed  Activities of Daily Living Home Assistive Devices/Equipment: Eyeglasses,Cane (specify quad or straight) ADL Screening (condition at time of admission) Patient's cognitive ability adequate to safely complete daily activities?: Yes Is the patient deaf or have difficulty hearing?: No Does the patient have difficulty seeing, even when wearing glasses/contacts?: No Does the patient have difficulty concentrating, remembering, or  making decisions?: No Patient able to express need for assistance with ADLs?: Yes Does the patient have difficulty dressing or bathing?: No Independently performs ADLs?: Yes (appropriate for developmental age) Does the patient have difficulty walking or climbing stairs?: Yes Weakness of Legs: Left Weakness of Arms/Hands: None  Permission Sought/Granted Permission sought to share information with : Chartered certified accountant granted to share information with : Yes, Verbal Permission Granted     Permission granted to share info w AGENCY: HH        Emotional Assessment Appearance:: Appears stated age   Affect (typically observed): Appropriate Orientation: : Oriented to Self,Oriented to Place,Oriented to  Time,Oriented to Situation Alcohol / Substance Use: Not Applicable Psych Involvement: No (comment)  Admission diagnosis:  Wound infection [T14.8XXA, L08.9] Chronic osteomyelitis of left lower leg (HCC) [M86.662] Sepsis, due to unspecified organism, unspecified whether acute organ dysfunction present [facility]) [A41.9] Patient Active Problem List   Diagnosis Date Noted  . Pyoderma gangrenosa   . Cellulitis of left lower extremity   . Chronic osteomyelitis involving lower leg, left ([place]) 01/04/2020  . Wound infection 01/04/2020  . Diabetes mellitus type 2 in obese ([place]) 01/04/2020  . Obesity, Class III, BMI 40-49.9 (morbid obesity) ([place]) 01/04/2020  . Chronic osteomyelitis of left lower leg ([place]) 01/04/2020  . Diabetes mellitus ([place]) 01/17/2017  . Ulcers of both lower legs ([place]) 01/17/2017  . History of endometriosis 01/17/2017  . Microcytic anemia 01/17/2017  .  Cigarette smoker 01/17/2017  . History of bipolar disorder 01/17/2017  . Recurrent boils 01/17/2017  . IVDU (intravenous drug user) 01/17/2017  . Morbid obesity (HCC) 01/17/2017   PCP:  Patient, No Pcp Per Pharmacy:   [facility] [address] [place] - [address]  [place] [address] Phone: [phone] Fax: [phone]     Social Determinants of Health (SDOH) Interventions    Readmission Risk Interventions No flowsheet data found.

## 2020-01-08 NOTE — Interval H&P Note (Signed)
History and Physical Interval Note:  01/08/2020 12:04 PM  [name]  has presented today for surgery, with the diagnosis of leg wound.  The various methods of treatment have been discussed with the patient and family. After consideration of risks, benefits and other options for treatment, the patient has consented to  Procedure(s) with comments: Excision of leg wound with Grafix placement (Left) - 1 hour, please with Grafix placement (Left) as a surgical intervention.  The patient's history has been reviewed, patient examined, no change in status, stable for surgery.  I have reviewed the patient's chart and labs.  Questions were answered to the patient's satisfaction.     [name]

## 2020-01-08 NOTE — Anesthesia Preprocedure Evaluation (Addendum)
Anesthesia Evaluation  Patient identified by MRN, date of birth, ID band Patient awake    Reviewed: Allergy & Precautions, NPO status , Patient's Chart, lab work & pertinent test results  History of Anesthesia Complications Negative for: history of anesthetic complications  Airway Mallampati: I  TM Distance: >3 FB Neck ROM: Full    Dental  (+) Dental Advisory Given   Pulmonary Current Smoker and Patient abstained from smoking.,  01/04/2020 SARS coronavirus Neg   breath sounds clear to auscultation       Cardiovascular (-) hypertension+ Peripheral Vascular Disease   Rhythm:Regular Rate:Normal     Neuro/Psych negative neurological ROS     GI/Hepatic Neg liver ROS, GERD  Poorly Controlled,  Endo/Other  diabetes (glu 124), Oral Hypoglycemic AgentsMorbid obesity  Renal/GU negative Renal ROS     Musculoskeletal   Abdominal (+) + obese,   Peds  Hematology  (+) Blood dyscrasia (Hb 9.5), anemia ,   Anesthesia Other Findings   Reproductive/Obstetrics                            Anesthesia Physical Anesthesia Plan  ASA: III  Anesthesia Plan: General   Post-op Pain Management:    Induction: Intravenous  PONV Risk Score and Plan: 2 and Ondansetron and Dexamethasone  Airway Management Planned: Oral ETT  Additional Equipment: None  Intra-op Plan:   Post-operative Plan: Extubation in OR  Informed Consent: I have reviewed the patients History and Physical, chart, labs and discussed the procedure including the risks, benefits and alternatives for the proposed anesthesia with the patient or authorized representative who has indicated his/her understanding and acceptance.     Dental advisory given  Plan Discussed with: CRNA and Surgeon  Anesthesia Plan Comments:        Anesthesia Quick Evaluation

## 2020-01-08 NOTE — Progress Notes (Signed)
Peripherally Inserted Central Catheter Placement  The IV Nurse has discussed with the patient and/or persons authorized to consent for the patient, the purpose of this procedure and the potential benefits and risks involved with this procedure.  The benefits include less needle sticks, lab draws from the catheter, and the patient may be discharged home with the catheter. Risks include, but not limited to, infection, bleeding, blood clot (thrombus formation), and puncture of an artery; nerve damage and irregular heartbeat and possibility to perform a PICC exchange if needed/ordered by physician.  Alternatives to this procedure were also discussed.  Bard Power PICC patient education guide, fact sheet on infection prevention and patient information card has been provided to patient /or left at bedside.    PICC Placement Documentation  PICC Single Lumen 01/08/20 PICC Right Basilic 42 cm 0 cm (Active)  Indication for Insertion or Continuance of Line Home intravenous therapies (PICC only) 01/08/20 2139  Exposed Catheter (cm) 0 cm 01/08/20 2139  Site Assessment Clean;Dry;Intact 01/08/20 2139  Line Status Flushed;Saline locked;Blood return noted 01/08/20 2139  Dressing Type Transparent 01/08/20 2139  Dressing Status Clean;Intact;Dry 01/08/20 2139  Antimicrobial disc in place? Yes 01/08/20 2139  Dressing Intervention New dressing 01/08/20 2139  Dressing Change Due 01/15/20 01/08/20 2139       [name] 01/08/2020, 9:40 PM

## 2020-01-08 NOTE — Discharge Instructions (Signed)
Wound Care with Acell  Guide to Wound Care  Proper wound care may reduce the risk of infection, improve healing rates, and limit scarring.  This is a general guide to help care for and manage wounds treated with ACell MicroMatrix?or Cytal Wound Matrix.   Dressing Changes The frequency of dressing changes can vary based on which product was applied, the size of the wound, or the amount of wound drainage. Dressing inspections are recommended, at least weekly.   If you have a Wound VAC it will be changed in one week after the first time it is applied.  Then it will be changed once or twice a week.   If you don't have a Wound VAC, then place KY gel on the wound daily and cover with gauze.  Dressing Types Primary Dressing:  Non-adherent dressing goes directly over wounds being treated with the powder or sheet (MicroMatrix and/or Cytal).  Secondary Dressing:  Secures the primary dressing in place and provides extra protection, compression, and absorption.  1. Wash Hands - To help decrease the risk of infection, caregivers should wash their hands for a minimum of 20 seconds and may use medical gloves.   2. Remove the Dressings - Avoid removing product from the wound by carefully removing the applicable dressing(s) at the time points recommended above, or as recommended by the treating physician.  Expected Color and Odor:  It is entirely normal for the wound to have an unpleasant odor and to form a caramel-colored gel as the product absorbs into the wound. It is  important to leave this gel on the wound site.  3. Clean the Wound - Use clean water or saline to gently rinse around the wound surface and remove any excess discharge that may be present on the wound. Do not wipe off any of the caramel-colored gel on the wound.   What to look out for:  Large or increased amount of drainage   Surrounding skin has worsening redness or hot to touch   Increased pain in or around the wound   Flu-like  symptoms, fatigue, decreased appetite, fever   Hard, crusty wound surface with black or brown coloring  4. Apply New Dressings - Dressings should cover the entire wound and be suitable for maintaining a moist wound environment.  The non-adherent mesh dressing should be left in place.  New dressing should consist of KY Jelly to keep the wound moist and soft gauze secured with a wrap or tape.   Maintain a Hydrated Wound Area It is important to keep the wound area moist throughout the healing process. If the wound appears to be dry during dressing changes, select a dressing that will hydrate the wound and maintain that ideal moist environment. If you are unsure what to do, ask the treating physician.  Remodeling Process Every patient heals differently, and no two cases are the same. The size and location of the wound, product type and layering configurations, and general patient health all contribute to how quickly a wound will heal.  While many factors can influence the rate at which the product absorbs, the following can be used as a general guide.   THINGS TO DO: Refrain from smoking High protein diet with plenty of vegetables and some fruit  Limit simple processed carbohydrates and sugar Protect the wound from trauma Protect the dressing  Micromatrix powder       Cytal Sheet            Sorbact dressing

## 2020-01-09 ENCOUNTER — Encounter (HOSPITAL_COMMUNITY): Payer: Self-pay | Admitting: Plastic Surgery

## 2020-01-09 DIAGNOSIS — B9689 Other specified bacterial agents as the cause of diseases classified elsewhere: Secondary | ICD-10-CM | POA: Diagnosis not present

## 2020-01-09 DIAGNOSIS — M609 Myositis, unspecified: Secondary | ICD-10-CM | POA: Diagnosis not present

## 2020-01-09 DIAGNOSIS — M86662 Other chronic osteomyelitis, left tibia and fibula: Secondary | ICD-10-CM | POA: Diagnosis not present

## 2020-01-09 DIAGNOSIS — E11628 Type 2 diabetes mellitus with other skin complications: Secondary | ICD-10-CM

## 2020-01-09 DIAGNOSIS — E1169 Type 2 diabetes mellitus with other specified complication: Secondary | ICD-10-CM | POA: Diagnosis not present

## 2020-01-09 DIAGNOSIS — L03116 Cellulitis of left lower limb: Secondary | ICD-10-CM | POA: Diagnosis not present

## 2020-01-09 LAB — BASIC METABOLIC PANEL
Anion gap: 11 (ref 5–15)
BUN: 13 mg/dL (ref 6–20)
CO2: 20 mmol/L — ABNORMAL LOW (ref 22–32)
Calcium: 8.9 mg/dL (ref 8.9–10.3)
Chloride: 104 mmol/L (ref 98–111)
Creatinine, Ser: 0.79 mg/dL (ref 0.44–1.00)
GFR, Estimated: >60 mL/min (ref >60)
Glucose, Bld: 188 mg/dL — ABNORMAL HIGH (ref 70–99)
Potassium: 3.8 mmol/L (ref 3.5–5.1)
Sodium: 135 mmol/L (ref 135–145)

## 2020-01-09 LAB — CULTURE, BLOOD (ROUTINE X 2)
Culture: NO GROWTH
Culture: NO GROWTH
Special Requests: ADEQUATE
Special Requests: ADEQUATE

## 2020-01-09 LAB — GLUCOSE, CAPILLARY
Glucose-Capillary: 149 mg/dL — ABNORMAL HIGH (ref 70–99)
Glucose-Capillary: 158 mg/dL — ABNORMAL HIGH (ref 70–99)
Glucose-Capillary: 216 mg/dL — ABNORMAL HIGH (ref 70–99)
Glucose-Capillary: 174 mg/dL — ABNORMAL HIGH (ref 70–99)

## 2020-01-09 LAB — CBC WITH DIFFERENTIAL/PLATELET
Abs Immature Granulocytes: 0.05 10*3/uL (ref 0.00–0.07)
Basophils Absolute: 0 10*3/uL (ref 0.0–0.1)
Basophils Relative: 0 %
Eosinophils Absolute: 0 10*3/uL (ref 0.0–0.5)
Eosinophils Relative: 0 %
HCT: 31.5 % — ABNORMAL LOW (ref 36.0–46.0)
Hemoglobin: 9.3 g/dL — ABNORMAL LOW (ref 12.0–15.0)
Immature Granulocytes: 1 %
Lymphocytes Relative: 18 %
Lymphs Abs: 1.9 10*3/uL (ref 0.7–4.0)
MCH: 22.5 pg — ABNORMAL LOW (ref 26.0–34.0)
MCHC: 29.5 g/dL — ABNORMAL LOW (ref 30.0–36.0)
MCV: 76.3 fL — ABNORMAL LOW (ref 80.0–100.0)
Monocytes Absolute: 0.8 10*3/uL (ref 0.1–1.0)
Monocytes Relative: 7 %
Neutro Abs: 8 10*3/uL — ABNORMAL HIGH (ref 1.7–7.7)
Neutrophils Relative %: 74 %
Platelets: 400 10*3/uL (ref 150–400)
RBC: 4.13 MIL/uL (ref 3.87–5.11)
RDW: 18.1 % — ABNORMAL HIGH (ref 11.5–15.5)
WBC: 10.8 10*3/uL — ABNORMAL HIGH (ref 4.0–10.5)
nRBC: 0 % (ref 0.0–0.2)

## 2020-01-09 MED ORDER — SULFAMETHOXAZOLE-TRIMETHOPRIM 800-160 MG PO TABS
1.0000 | ORAL_TABLET | ORAL | Status: DC
  Administered 2020-01-10 – 2020-01-13 (×2): 1 via ORAL
  Filled 2020-01-09 (×2): qty 1

## 2020-01-09 NOTE — Progress Notes (Signed)
PHARMACY CONSULT NOTE FOR:  OUTPATIENT  PARENTERAL ANTIBIOTIC THERAPY (OPAT)  Indication: Myositis/Cellulitis Regimen: Cefepime 2g q8hr End date: 02/05/19  IV antibiotic discharge orders are pended. To discharging provider:  please sign these orders via discharge navigator,  Select New Orders & click on the button choice - Manage This Unsigned Work.     Thank you for allowing pharmacy to be a part of this patient's care.  Jennette Kettle 01/09/2020, 10:53 AM

## 2020-01-09 NOTE — Plan of Care (Signed)
  Problem: Education: Goal: Knowledge of General Education information will improve Description: Including pain rating scale, medication(s)/side effects and non-pharmacologic comfort measures Outcome: Progressing   Problem: Health Behavior/Discharge Planning: Goal: Ability to manage health-related needs will improve Outcome: Progressing   Problem: Nutrition: Goal: Adequate nutrition will be maintained Outcome: Progressing   Problem: Elimination: Goal: Will not experience complications related to bowel motility Outcome: Progressing Goal: Will not experience complications related to urinary retention Outcome: Progressing   Problem: Safety: Goal: Ability to remain free from injury will improve Outcome: Progressing   

## 2020-01-09 NOTE — Plan of Care (Signed)

## 2020-01-09 NOTE — Progress Notes (Signed)
Lead for Infectious Disease  Date of Admission:  01/04/2020           Reason for visit: Follow up on wound infection  Current antibiotics: Cefepime  ASSESSMENT:    #Cellulitis and Myositis Involving the left lower extremity.  Admission wound cultures (superficial) grew Providencia rettgeri and PsA.  She went to the OR 01/08/20 with Dr Marla Roe for debridement.  Pathology and cultures from this are pending (Gram stain negative).  #Pyoderma gangrenosum Followed at [facility] and previously on Humira but then was unable to continue due to insurance issues and had worsening of her leg wound.  She has now been started on Prednisone 89m daily to help with PG treatment  #Diabetes A1c is 6.7 this admission   PLAN:    --Will continue cefepime for 4 weeks from OR to treat cellulitis and myositis.  Will adjust as needed based on OR cultures.  Ultimately, feel that wound healing will mostly be dictated by treatment of her underlying disease process.  Will need close derm and wound care follow up.   --Will add PCP prophylaxis as anticipate she will be on greater than 264mprednisone for at least a month.   --See OPAT note below.  Please call with questions.    Diagnosis: Myositis/cellulitis  Culture Result:  Providencia and PsA  Allergies  Allergen Reactions  . Carbamazepine Other (See Comments)    Causes aplastic anemia Other reaction(s): Other (See Comments) Causes aplastic anemia "TEGRETOL"   . Zolpidem Tartrate Rash    "AMBIEN"     OPAT Orders Discharge antibiotics to be given via PICC line Discharge antibiotics: Cefepime 2gm q8h Per pharmacy protocol   Duration: 4 weeks  End Date: [facility]/05/20  [facility]  Home health RN for IV administration and teaching; PICC line care and labs.    Labs weekly while on IV antibiotics: _x_ CBC with differential __ BMP _x_ CMP __ CRP __ ESR __ Vancomycin trough __ CK  _x_ Please pull PIC at  completion of IV antibiotics __ Please leave PIC in place until doctor has seen patient or been notified  Fax weekly labs to ([phone] Follow Up Appt: 01/15/20 @ 3:45 pm (Virtual appt) with StJanene Madeira  MEDICATIONS:    Scheduled Meds: . Chlorhexidine Gluconate Cloth  6 each Topical Daily  . docusate sodium  100 mg Oral Daily  . enoxaparin (LOVENOX) injection  40 mg Subcutaneous Daily  . ferrous sulfate  325 mg Oral BID WC  . insulin aspart  0-15 Units Subcutaneous TID WC  . insulin aspart  0-5 Units Subcutaneous QHS  . magnesium oxide  400 mg Oral Once  . predniSONE  60 mg Oral Q breakfast  . pregabalin  100 mg Oral TID  . sodium chloride flush  10-40 mL Intracatheter Q12H    Continuous Infusions: . ceFEPime (MAXIPIME) IV 2 g (01/09/20 0545)  . lactated ringers Stopped (01/04/20 2002)  . lactated ringers 100 mL/hr at 01/08/20 1221  . lactated ringers 10 mL/hr at 01/08/20 2205    PRN Meds: acetaminophen **OR** acetaminophen, cyclobenzaprine, HYDROmorphone (DILAUDID) injection, magnesium oxide, ondansetron **OR** ondansetron (ZOFRAN) IV, oxyCODONE-acetaminophen, senna-docusate, sodium chloride flush   INTERVAL EVENTS:   No acute events S/P OR with Plastics PICC placed  SUBJECTIVE:   Having post-operative pain but otherwise no new complaints.   Review of Systems  Constitutional: Negative for chills and fever.  Respiratory: Negative.   Cardiovascular: Negative.  Musculoskeletal:       + Pain     OBJECTIVE:   Allergies  Allergen Reactions  . Carbamazepine Other (See Comments)    Causes aplastic anemia Other reaction(s): Other (See Comments) Causes aplastic anemia "TEGRETOL"   . Zolpidem Tartrate Rash    "AMBIEN"     Blood pressure (!) 105/45, pulse 82, temperature 98.2 F (36.8 C), temperature source Oral, resp. rate 16, height 5' 8" (1.727 m), weight 121.2 kg, SpO2 99 %. Body mass index is 40.64 kg/m.  Physical  Exam Constitutional:      General: She is not in acute distress.    Appearance: Normal appearance.  Pulmonary:     Effort: Pulmonary effort is normal. No respiratory distress.  Musculoskeletal:     Comments: Right UE PICC Left leg wrapped.  Wound vac in place.   Skin:    General: Skin is warm and dry.  Neurological:     General: No focal deficit present.     Mental Status: She is alert and oriented to person, place, and time.  Psychiatric:        Mood and Affect: Mood normal.        Behavior: Behavior normal.      Lab Results & Microbiology Lab Results  Component Value Date   WBC 10.8 (H) 01/09/2020   HGB 9.3 (L) 01/09/2020   HCT 31.5 (L) 01/09/2020   MCV 76.3 (L) 01/09/2020   PLT 400 01/09/2020    Lab Results  Component Value Date   NA 135 01/09/2020   K 3.8 01/09/2020   CO2 20 (L) 01/09/2020   GLUCOSE 188 (H) 01/09/2020   BUN 13 01/09/2020   CREATININE 0.79 01/09/2020   CALCIUM 8.9 01/09/2020   GFRNONAA >60 01/09/2020   GFRAA >60 01/20/2017    Lab Results  Component Value Date   ALT 41 01/04/2020   AST 24 01/04/2020   ALKPHOS 133 (H) 01/04/2020   BILITOT 0.5 01/04/2020     I have reviewed the micro and lab results in Epic.  Imaging US EKG SITE RITE  Result Date: 01/08/2020 If Site Rite image not attached, placement could not be confirmed due to current cardiac rhythm.    Imaging  independently reviewed in Epic.    [facility] [phone] pager 01/09/2020, 12:23 PM    

## 2020-01-09 NOTE — Progress Notes (Signed)
Pt is doing fairly well today. She states she is still in a lot of pain. Pain medications help for a little, but will increase quickly when medication wears off. Pt has no other complaints. Vital signs stable.

## 2020-01-09 NOTE — Progress Notes (Addendum)
PROGRESS NOTE  [name] [id number] DOB: 19-Nov-1985 DOA: 01/04/2020 PCP: Patient, No Pcp Per  HPI/Recap of past 24 hours: HPI from Dr Loleta Books [name] is a 35 y.o. F with history of type 2 diabetes on oral hypoglycemics, ?RA,  pyoderma gangrenosum and chronic left leg wound with polymicrobial infection presents for evaluation of fever, worsening leg pain and purulent drainage. Unfortunately she had worsening left leg wound for last 3 years, poor outpatient support and follow-up, multiple previous interventions including hydrotherapy and last seen at wound care clinic 1 week ago presents to the ER because it is started smelling foul and draining purulent. Reported temperature 102 at home. In the ED, afebrile but tachycardic. Lactic acid 3.1. Patient also on Metformin at home. Treated with IV fluids and antibiotics with sepsis protocol and admitted to the hospital.    Today, patient reported postop pain to left leg, denied any other new complaints.      Assessment/Plan: Principal Problem:   Chronic osteomyelitis involving lower leg, left (HCC) Active Problems:   Wound infection   Diabetes mellitus type 2 in obese (HCC)   Obesity, Class III, BMI 40-49.9 (morbid obesity) (HCC)   Chronic osteomyelitis of left lower leg (HCC)   Pyoderma gangrenosa   Cellulitis of left lower extremity   Acute on chronic left leg cellulitis and myositis in the setting of pyoderma gangrenosum s/p debridement on 01/08/20 Currently afebrile, with mild leukocytosis MRI with no evidence of osteo, or abscess Wound culture growing Pseudomonas, providentia Appreciate plastic on board, discussed with Dr. Marla Roe, recommend starting patient on steroids for pyoderma gangrenosum Continue prednisone 60 mg daily Appreciate ID on board- D/C Zosyn, switch to cefepime for a total of 4 weeks as per ID Pain management Monitor closely  Diabetes mellitus type 2 A1c 6.7, controlled Continue SSI,  Accu-Cheks, hypoglycemic protocol At home on Farxiga, Metformin  Microcytic anemia/iron deficiency anemia Anemia panel sats 6%, iron 18 Continue oral iron supplementation  ??History of ?inflammatory bowel disease (undiagnosed) Has seen multiple providers in the [facility] system (Dermatology, Rheumatology, GI, ID), diagnosed with possible ??inflammatory arthritis from underlying chronic hep C, rec colonoscopy to evaluate, yet to schedule    Insurance no longer approving Humira for patient solely for pyoderma gangrenosum Advise pt to schedule colonoscopy for further evaluation   Morbid obesity Lifestyle modification advised       Malnutrition Type:      Malnutrition Characteristics:      Nutrition Interventions:       Estimated body mass index is 40.64 kg/m as calculated from the following:   Height as of this encounter: 5\' 8"  (1.727 m).   Weight as of this encounter: 121.2 kg.     Code Status: Full  Family Communication: Discussed with patient  Disposition Plan: Status is: Inpatient  Remains inpatient appropriate because:Inpatient level of care appropriate due to severity of illness   Dispo:  Patient From: Home  Planned Disposition: Home with Health Care Svc  Expected discharge date: 01/13/2020  Medically stable for discharge: No    Consultants:  Plastic surgery  ID  Procedures:  S/p debridement on 01/08/2020  Antimicrobials:  Cefepime  DVT prophylaxis: Lovenox   Objective: Vitals:   01/08/20 1511 01/08/20 1615 01/08/20 1957 01/09/20 0832  BP: 110/64 118/67 126/66 (!) 105/45  Pulse: 74 83 73 82  Resp: 15 16 16 16   Temp: 98.4 F (36.9 C) (!) 97.5 F (36.4 C) 98.4 F (36.9 C) 98.2 F (36.8 C)  TempSrc: Oral Oral  Oral Oral  SpO2: 90% 95% 94% 99%  Weight:      Height:        Intake/Output Summary (Last 24 hours) at 01/09/2020 1414 Last data filed at 01/09/2020 1100 Gross per 24 hour  Intake 684.8 ml  Output 2 ml  Net 682.8 ml   Filed  Weights   01/04/20 2228 01/07/20 2339 01/08/20 1152  Weight: 123.8 kg 121.2 kg 121.2 kg    Exam:  General: NAD   Cardiovascular: S1, S2 present  Respiratory: CTAB  Abdomen: Soft, nontender, nondistended, bowel sounds present  Musculoskeletal: No bilateral pedal edema noted, left leg in Ace wrap, C/D/I  Skin: Normal  Psychiatry: Normal mood    Data Reviewed: CBC: Recent Labs  Lab 01/04/20 1746 01/05/20 0111 01/05/20 0525 01/06/20 0426 01/07/20 0502 01/08/20 0158 01/09/20 0410  WBC 11.1*   < > 6.9 7.7 7.8 8.1 10.8*  NEUTROABS 6.6  --   --  4.2  --   --  8.0*  HGB 9.6*   < > 10.5* 8.1* 8.7* 9.5* 9.3*  HCT 33.4*   < > 35.2* 27.4* 26.8* 31.5* 31.5*  MCV 79.3*   < > 77.7* 76.5* 71.8* 74.8* 76.3*  PLT 364   < > 283 325 285 408* 400   < > = values in this interval not displayed.   Basic Metabolic Panel: Recent Labs  Lab 01/05/20 0525 01/06/20 0426 01/07/20 0350 01/08/20 0158 01/09/20 0410  NA 139 139 139 138 135  K 3.2* 4.0 4.6 4.2 3.8  CL 108 109 109 106 104  CO2 19* 18* 17* 19* 20*  GLUCOSE 120* 112* 115* 116* 188*  BUN 7 7 8 10 13   CREATININE 0.76 0.66 0.76 0.79 0.79  CALCIUM 8.2* 8.7* 8.7* 9.2 8.9  MG  --  2.0  --   --   --   PHOS  --  3.1  --   --   --    GFR: Estimated Creatinine Clearance: 133.2 mL/min (by C-G formula based on SCr of 0.79 mg/dL). Liver Function Tests: Recent Labs  Lab 01/04/20 1746  AST 24  ALT 41  ALKPHOS 133*  BILITOT 0.5  PROT 7.0  ALBUMIN 3.1*   No results for input(s): LIPASE, AMYLASE in the last 168 hours. No results for input(s): AMMONIA in the last 168 hours. Coagulation Profile: Recent Labs  Lab 01/04/20 1746  INR 1.1   Cardiac Enzymes: Recent Labs  Lab 01/06/20 0426  CKTOTAL 50   BNP (last 3 results) No results for input(s): PROBNP in the last 8760 hours. HbA1C: No results for input(s): HGBA1C in the last 72 hours. CBG: Recent Labs  Lab 01/08/20 1343 01/08/20 1626 01/08/20 2215 01/09/20 0826  01/09/20 1221  GLUCAP 175* 158* 222* 149* 158*   Lipid Profile: No results for input(s): CHOL, HDL, LDLCALC, TRIG, CHOLHDL, LDLDIRECT in the last 72 hours. Thyroid Function Tests: No results for input(s): TSH, T4TOTAL, FREET4, T3FREE, THYROIDAB in the last 72 hours. Anemia Panel: Recent Labs    01/07/20 0350  FERRITIN 31  TIBC 315  IRON 18*   Urine analysis:    Component Value Date/Time   COLORURINE YELLOW 01/04/2020 2233   APPEARANCEUR CLEAR 01/04/2020 2233   LABSPEC 1.013 01/04/2020 2233   PHURINE 6.0 01/04/2020 2233   GLUCOSEU NEGATIVE 01/04/2020 2233   HGBUR NEGATIVE 01/04/2020 2233   BILIRUBINUR NEGATIVE 01/04/2020 2233   KETONESUR NEGATIVE 01/04/2020 2233   PROTEINUR NEGATIVE 01/04/2020 2233   NITRITE NEGATIVE 01/04/2020 2233  LEUKOCYTESUR NEGATIVE 01/04/2020 2233   Sepsis Labs: @LABRCNTIP (procalcitonin:4,lacticidven:4)  ) Recent Results (from the past 240 hour(s))  Culture, blood (Routine x 2)     Status: None   Collection Time: 01/04/20  5:47 PM   Specimen: BLOOD  Result Value Ref Range Status   Specimen Description BLOOD SITE NOT SPECIFIED  Final   Special Requests   Final    BOTTLES DRAWN AEROBIC AND ANAEROBIC Blood Culture adequate volume   Culture   Final    NO GROWTH 5 DAYS Performed at [facility], [place] [address], [place], [place] [address]    Report Status 01/09/2020 FINAL  Final  Culture, blood (Routine x 2)     Status: None   Collection Time: 01/04/20  8:52 PM   Specimen: BLOOD  Result Value Ref Range Status   Specimen Description BLOOD LEFT ANTECUBITAL  Final   Special Requests   Final    BOTTLES DRAWN AEROBIC AND ANAEROBIC Blood Culture adequate volume   Culture   Final    NO GROWTH 5 DAYS Performed at [facility], [place] [address] [place], [place] [address]    Report Status 01/09/2020 FINAL  Final  SARS CORONAVIRUS 2 (TAT 6-24 HRS) Nasopharyngeal Nasopharyngeal Swab     Status: None   Collection Time: 01/04/20  9:56 PM    Specimen: Nasopharyngeal Swab  Result Value Ref Range Status   SARS Coronavirus 2 NEGATIVE NEGATIVE Final    Comment: (NOTE) SARS-CoV-2 target nucleic acids are NOT DETECTED.  The SARS-CoV-2 RNA is generally detectable in upper and lower respiratory specimens during the acute phase of infection. Negative results do not preclude SARS-CoV-2 infection, do not rule out co-infections with other pathogens, and should not be used as the sole basis for treatment or other patient management decisions. Negative results must be combined with clinical observations, patient history, and epidemiological information. The expected result is Negative.  Fact Sheet for Patients: SugarRoll.be  Fact Sheet for Healthcare Providers: https://www.woods-mathews.com/  This test is not yet approved or cleared by the [place] FDA and  has been authorized for detection and/or diagnosis of SARS-CoV-2 by FDA under an Emergency Use Authorization (EUA). This EUA will remain  in effect (meaning this test can be used) for the duration of the COVID-19 declaration under Se ction 564(b)(1) of the Act, 21 U.S.C. section 360bbb-3(b)(1), unless the authorization is terminated or revoked sooner.  Performed at [facility], [place] [address], [place], [place] [address]   Aerobic/Anaerobic Culture (surgical/deep wound)     Status: None (Preliminary result)   Collection Time: 01/05/20 10:19 PM   Specimen: Wound  Result Value Ref Range Status   Specimen Description WOUND LEFT LEG  Final   Special Requests NONE  Final   Gram Stain   Final    FEW WBC PRESENT, PREDOMINANTLY PMN RARE GRAM NEGATIVE RODS RARE GRAM POSITIVE COCCI IN CHAINS Performed at [facility], [place] [address] [place], [place] [address]    Culture   Final    FEW PROVIDENCIA RETTGERI FEW PSEUDOMONAS AERUGINOSA NO ANAEROBES ISOLATED; CULTURE IN PROGRESS FOR 5 DAYS    Report Status PENDING   Incomplete   Organism ID, Bacteria PROVIDENCIA RETTGERI  Final   Organism ID, Bacteria PSEUDOMONAS AERUGINOSA  Final      Susceptibility   Pseudomonas aeruginosa - MIC*    CEFTAZIDIME <=1 SENSITIVE Sensitive     CIPROFLOXACIN >=4 RESISTANT Resistant     GENTAMICIN <=1 SENSITIVE Sensitive  IMIPENEM 1 SENSITIVE Sensitive     CEFEPIME 1 SENSITIVE Sensitive     * FEW PSEUDOMONAS AERUGINOSA   Providencia rettgeri - MIC*    AMPICILLIN >=32 RESISTANT Resistant     CEFAZOLIN >=64 RESISTANT Resistant     CEFEPIME 1 SENSITIVE Sensitive     CEFTAZIDIME <=1 SENSITIVE Sensitive     CIPROFLOXACIN INTERMEDIATE Intermediate     GENTAMICIN <=1 SENSITIVE Sensitive     IMIPENEM 1 SENSITIVE Sensitive     TRIMETH/SULFA <=20 SENSITIVE Sensitive     AMPICILLIN/SULBACTAM <=2 SENSITIVE Sensitive     PIP/TAZO <=4 SENSITIVE Sensitive     * FEW PROVIDENCIA RETTGERI  Aerobic/Anaerobic Culture (surgical/deep wound)     Status: None (Preliminary result)   Collection Time: 01/08/20  1:07 PM   Specimen: Leg, Left; Wound  Result Value Ref Range Status   Specimen Description WOUND  Final   Special Requests LEFT LOWER LEG SPEC A  Final   Gram Stain NO WBC SEEN NO ORGANISMS SEEN   Final   Culture   Final    NO GROWTH < 24 HOURS Performed at [facility], [place] [address] [place], [place] [address]    Report Status PENDING  Incomplete      Studies: [place] EKG SITE RITE  Result Date: 01/08/2020 If Site Rite image not attached, placement could not be confirmed due to current cardiac rhythm.   Scheduled Meds: . Chlorhexidine Gluconate Cloth  6 each Topical Daily  . docusate sodium  100 mg Oral Daily  . enoxaparin (LOVENOX) injection  40 mg Subcutaneous Daily  . ferrous sulfate  325 mg Oral BID WC  . insulin aspart  0-15 Units Subcutaneous TID WC  . insulin aspart  0-5 Units Subcutaneous QHS  . magnesium oxide  400 mg Oral Once  . predniSONE  60 mg Oral Q breakfast  . pregabalin  100 mg Oral TID  .  sodium chloride flush  10-40 mL Intracatheter Q12H  . [START ON 01/10/2020] sulfamethoxazole-trimethoprim  1 tablet Oral Once per day on Mon Wed Fri    Continuous Infusions: . ceFEPime (MAXIPIME) IV 2 g (01/09/20 1311)  . lactated ringers Stopped (01/04/20 2002)  . lactated ringers 100 mL/hr at 01/08/20 1221  . lactated ringers 10 mL/hr at 01/08/20 2205     LOS: 5 days     Alma Friendly, MD [facility]  If 7PM-7AM, please contact night-coverage [email or web address] 01/09/2020, 2:14 PM

## 2020-01-09 NOTE — TOC Progression Note (Signed)
Transition of Care [facility]) - Progression Note    Patient Details  Name: [name] MRN: [id number] Date of Birth: [birth year]  Transition of [facility]) CM/SW Contact  Mearl Latin, LCSW Phone Number: 01/09/2020, 3:24 PM  Clinical Narrative:    CSW faxed signed wound vac forms to [facility] for insurance authorization. CSW spoke with patient. She confirmed her address and that she would appreciate home health though she reported she knows how to do her IV already from the last time she needed antibiotics. Marchelle Folks, will follow up with patient to make sure everything will be set for home. Patient requested CSW contact financial counseling to contact Medicaid to make sure it does not expire in March (due to her child being now older than 5). CSW emailed financial counseling. Patient reported her wound got so bad because the [facility] wound clinic closed and it took to long to get another referral. She reported her disability is still pending and her sister and mom are watching her kids at home.     Expected Discharge Plan: Home w Home Health Services Barriers to Discharge: Continued Medical Work up  Expected Discharge Plan and Services Expected Discharge Plan: Home w Home Health Services   Discharge Planning Services: CM Consult Post Acute Care Choice: Home Health                                         Social Determinants of Health (SDOH) Interventions    Readmission Risk Interventions No flowsheet data found.

## 2020-01-10 ENCOUNTER — Encounter (HOSPITAL_BASED_OUTPATIENT_CLINIC_OR_DEPARTMENT_OTHER): Payer: Medicaid Other | Admitting: Internal Medicine

## 2020-01-10 DIAGNOSIS — L03116 Cellulitis of left lower limb: Secondary | ICD-10-CM | POA: Diagnosis not present

## 2020-01-10 DIAGNOSIS — E1169 Type 2 diabetes mellitus with other specified complication: Secondary | ICD-10-CM | POA: Diagnosis not present

## 2020-01-10 DIAGNOSIS — L88 Pyoderma gangrenosum: Secondary | ICD-10-CM | POA: Diagnosis not present

## 2020-01-10 LAB — BASIC METABOLIC PANEL
Anion gap: 10 (ref 5–15)
BUN: 13 mg/dL (ref 6–20)
CO2: 21 mmol/L — ABNORMAL LOW (ref 22–32)
Calcium: 9.1 mg/dL (ref 8.9–10.3)
Chloride: 106 mmol/L (ref 98–111)
Creatinine, Ser: 0.8 mg/dL (ref 0.44–1.00)
GFR, Estimated: >60 mL/min (ref >60)
Glucose, Bld: 130 mg/dL — ABNORMAL HIGH (ref 70–99)
Potassium: 3.7 mmol/L (ref 3.5–5.1)
Sodium: 137 mmol/L (ref 135–145)

## 2020-01-10 LAB — CBC WITH DIFFERENTIAL/PLATELET
Abs Immature Granulocytes: 0.1 10*3/uL — ABNORMAL HIGH (ref 0.00–0.07)
Basophils Absolute: 0.1 10*3/uL (ref 0.0–0.1)
Basophils Relative: 1 %
Eosinophils Absolute: 0.1 10*3/uL (ref 0.0–0.5)
Eosinophils Relative: 1 %
HCT: 32.6 % — ABNORMAL LOW (ref 36.0–46.0)
Hemoglobin: 9.5 g/dL — ABNORMAL LOW (ref 12.0–15.0)
Immature Granulocytes: 1 %
Lymphocytes Relative: 28 %
Lymphs Abs: 3.4 10*3/uL (ref 0.7–4.0)
MCH: 22.5 pg — ABNORMAL LOW (ref 26.0–34.0)
MCHC: 29.1 g/dL — ABNORMAL LOW (ref 30.0–36.0)
MCV: 77.3 fL — ABNORMAL LOW (ref 80.0–100.0)
Monocytes Absolute: 0.8 10*3/uL (ref 0.1–1.0)
Monocytes Relative: 7 %
Neutro Abs: 7.5 10*3/uL (ref 1.7–7.7)
Neutrophils Relative %: 62 %
Platelets: 436 10*3/uL — ABNORMAL HIGH (ref 150–400)
RBC: 4.22 MIL/uL (ref 3.87–5.11)
RDW: 18.3 % — ABNORMAL HIGH (ref 11.5–15.5)
WBC: 11.9 10*3/uL — ABNORMAL HIGH (ref 4.0–10.5)
nRBC: 0 % (ref 0.0–0.2)

## 2020-01-10 LAB — AEROBIC/ANAEROBIC CULTURE W GRAM STAIN (SURGICAL/DEEP WOUND)

## 2020-01-10 LAB — GLUCOSE, CAPILLARY
Glucose-Capillary: 110 mg/dL — ABNORMAL HIGH (ref 70–99)
Glucose-Capillary: 120 mg/dL — ABNORMAL HIGH (ref 70–99)
Glucose-Capillary: 208 mg/dL — ABNORMAL HIGH (ref 70–99)
Glucose-Capillary: 206 mg/dL — ABNORMAL HIGH (ref 70–99)
Glucose-Capillary: 157 mg/dL — ABNORMAL HIGH (ref 70–99)

## 2020-01-10 LAB — SURGICAL PATHOLOGY

## 2020-01-10 MED ORDER — OXYCODONE HCL 5 MG PO TABS
5.0000 mg | ORAL_TABLET | Freq: Four times a day (QID) | ORAL | Status: DC | PRN
  Administered 2020-01-11 (×2): 5 mg via ORAL
  Filled 2020-01-10 (×2): qty 1

## 2020-01-10 NOTE — TOC Progression Note (Signed)
Transition of Care [facility]) - Progression Note    Patient Details  Name: [name] MRN: [id number] Date of Birth: November 24, 1985  Transition of [facility]) CM/SW [place], LCSW Phone Number: 01/10/2020, 2:47 PM  Clinical Narrative:    [employer] wound vac approved and will be delivered to patient's room today. CSW still awaiting response from [facility] on RN acceptance.    Expected Discharge Plan: [place] Barriers to Discharge: Continued Medical Work up  Expected Discharge Plan and Services Expected Discharge Plan: [place]   Discharge Planning Services: CM Consult Post Acute Care Choice: Home Health                                         Social Determinants of Health (SDOH) Interventions    Readmission Risk Interventions No flowsheet data found.

## 2020-01-10 NOTE — Plan of Care (Signed)
  Problem: Education: Goal: Knowledge of General Education information will improve Description: Including pain rating scale, medication(s)/side effects and non-pharmacologic comfort measures Outcome: Progressing   Problem: Clinical Measurements: Goal: Ability to maintain clinical measurements within normal limits will improve Outcome: Progressing   Problem: Activity: Goal: Risk for activity intolerance will decrease Outcome: Progressing   Problem: Coping: Goal: Level of anxiety will decrease Outcome: Progressing   Problem: Pain Managment: Goal: General experience of comfort will improve Outcome: Progressing   Problem: Safety: Goal: Ability to remain free from injury will improve Outcome: Progressing   Problem: Skin Integrity: Goal: Risk for impaired skin integrity will decrease Outcome: Progressing

## 2020-01-10 NOTE — Progress Notes (Signed)
PROGRESS NOTE  [name] WPY:[id number] DOB: Jan 23, 1985 DOA: 01/04/2020 PCP: Patient, No Pcp Per  HPI/Recap of past 24 hours: HPI from Dr Loleta Books [name] is a 35 y.o. F with history of type 2 diabetes on oral hypoglycemics, ?RA,  pyoderma gangrenosum and chronic left leg wound with polymicrobial infection presents for evaluation of fever, worsening leg pain and purulent drainage. Unfortunately she had worsening left leg wound for last 3 years, poor outpatient support and follow-up, multiple previous interventions including hydrotherapy and last seen at wound care clinic 1 week ago presents to the ER because it is started smelling foul and draining purulent. Reported temperature 102 at home. In the ED, afebrile but tachycardic. Lactic acid 3.1. Patient also on Metformin at home. Treated with IV fluids and antibiotics with sepsis protocol and admitted to the hospital.    Today, patient still complaining of significant post op pain to the left lower extremity.  Reports frustration about her overall condition, intermittently teary, offered my support/encouragement.      Assessment/Plan: Principal Problem:   Chronic osteomyelitis involving lower leg, left (HCC) Active Problems:   Wound infection   Diabetes mellitus type 2 in obese (HCC)   Obesity, Class III, BMI 40-49.9 (morbid obesity) (HCC)   Chronic osteomyelitis of left lower leg (HCC)   Pyoderma gangrenosa   Cellulitis of left lower extremity   Acute on chronic left leg cellulitis and myositis in the setting of pyoderma gangrenosum s/p debridement on 01/08/20 Currently afebrile, with mild leukocytosis MRI with no evidence of osteo, or abscess Wound culture growing Pseudomonas, providentia Appreciate plastic on board, discussed with Dr. Marla Roe, recommend starting patient on steroids for pyoderma gangrenosum Continue prednisone 60 mg daily Appreciate ID on board- D/C Zosyn, switch to cefepime for a total of 4 weeks as  per ID Pain management Monitor closely  Diabetes mellitus type 2 A1c 6.7, controlled Continue SSI, Accu-Cheks, hypoglycemic protocol At home on Farxiga, Metformin  Microcytic anemia/iron deficiency anemia Anemia panel sats 6%, iron 18 Continue oral iron supplementation  ??History of ?inflammatory bowel disease (undiagnosed) Has seen multiple providers in the [facility] (Dermatology, Rheumatology, GI, ID), diagnosed with possible ??inflammatory arthritis from underlying chronic hep C, rec colonoscopy to evaluate, yet to schedule    Insurance no longer approving Humira for patient solely for pyoderma gangrenosum Advised pt to schedule colonoscopy asap for further evaluation   Morbid obesity Lifestyle modification advised       Malnutrition Type:      Malnutrition Characteristics:      Nutrition Interventions:       Estimated body mass index is 40.64 kg/m as calculated from the following:   Height as of this encounter: 5\' 8"  (1.727 m).   Weight as of this encounter: 121.2 kg.     Code Status: Full  Family Communication: Discussed with patient  Disposition Plan: Status is: Inpatient  Remains inpatient appropriate because:Inpatient level of care appropriate due to severity of illness   Dispo:  Patient From: Home  Planned Disposition: Home with Health Care Svc  Expected discharge date: 01/13/2020  Medically stable for discharge: No    Consultants:  Plastic surgery  ID  Procedures:  S/p debridement on 01/08/2020  Antimicrobials:  Cefepime  DVT prophylaxis: Lovenox   Objective: Vitals:   01/09/20 1300 01/09/20 1900 01/10/20 0400 01/10/20 0934  BP: 116/60 115/67 (!) 112/59 116/71  Pulse: 80 67 69 89  Resp: 17 15 18 17   Temp: 98.1 F (36.7 C) 98.9 F (37.2  C) 98.6 F (37 C) 98 F (36.7 C)  TempSrc: Oral Oral Oral Oral  SpO2: 99% 97% 100% 92%  Weight:      Height:        Intake/Output Summary (Last 24 hours) at 01/10/2020 1316 Last  data filed at 01/10/2020 1020 Gross per 24 hour  Intake 560 ml  Output 1 ml  Net 559 ml   Filed Weights   01/04/20 2228 01/07/20 2339 01/08/20 1152  Weight: 123.8 kg 121.2 kg 121.2 kg    Exam:  General: NAD, intermittently teary  Cardiovascular: S1, S2 present  Respiratory: CTAB  Abdomen: Soft, nontender, nondistended, bowel sounds present  Musculoskeletal: No bilateral pedal edema noted, left leg in Ace wrap, C/D/I  Skin: Normal  Psychiatry: Fair mood    Data Reviewed: CBC: Recent Labs  Lab 01/04/20 1746 01/05/20 0111 01/06/20 0426 01/07/20 0502 01/08/20 0158 01/09/20 0410 01/10/20 0416  WBC 11.1*   < > 7.7 7.8 8.1 10.8* 11.9*  NEUTROABS 6.6  --  4.2  --   --  8.0* 7.5  HGB 9.6*   < > 8.1* 8.7* 9.5* 9.3* 9.5*  HCT 33.4*   < > 27.4* 26.8* 31.5* 31.5* 32.6*  MCV 79.3*   < > 76.5* 71.8* 74.8* 76.3* 77.3*  PLT 364   < > 325 285 408* 400 436*   < > = values in this interval not displayed.   Basic Metabolic Panel: Recent Labs  Lab 01/06/20 0426 01/07/20 0350 01/08/20 0158 01/09/20 0410 01/10/20 0416  NA 139 139 138 135 137  K 4.0 4.6 4.2 3.8 3.7  CL 109 109 106 104 106  CO2 18* 17* 19* 20* 21*  GLUCOSE 112* 115* 116* 188* 130*  BUN 7 8 10 13 13   CREATININE 0.66 0.76 0.79 0.79 0.80  CALCIUM 8.7* 8.7* 9.2 8.9 9.1  MG 2.0  --   --   --   --   PHOS 3.1  --   --   --   --    GFR: Estimated Creatinine Clearance: 133.2 mL/min (by C-G formula based on SCr of 0.8 mg/dL). Liver Function Tests: Recent Labs  Lab 01/04/20 1746  AST 24  ALT 41  ALKPHOS 133*  BILITOT 0.5  PROT 7.0  ALBUMIN 3.1*   No results for input(s): LIPASE, AMYLASE in the last 168 hours. No results for input(s): AMMONIA in the last 168 hours. Coagulation Profile: Recent Labs  Lab 01/04/20 1746  INR 1.1   Cardiac Enzymes: Recent Labs  Lab 01/06/20 0426  CKTOTAL 50   BNP (last 3 results) No results for input(s): PROBNP in the last 8760 hours. HbA1C: No results for  input(s): HGBA1C in the last 72 hours. CBG: Recent Labs  Lab 01/09/20 1634 01/09/20 2128 01/10/20 0634 01/10/20 0643 01/10/20 1116  GLUCAP 216* 174* 120* 110* 208*   Lipid Profile: No results for input(s): CHOL, HDL, LDLCALC, TRIG, CHOLHDL, LDLDIRECT in the last 72 hours. Thyroid Function Tests: No results for input(s): TSH, T4TOTAL, FREET4, T3FREE, THYROIDAB in the last 72 hours. Anemia Panel: No results for input(s): VITAMINB12, FOLATE, FERRITIN, TIBC, IRON, RETICCTPCT in the last 72 hours. Urine analysis:    Component Value Date/Time   COLORURINE YELLOW 01/04/2020 2233   [place] 01/04/2020 2233   LABSPEC 1.013 01/04/2020 2233   PHURINE 6.0 01/04/2020 [place] 01/04/2020 2233   HGBUR NEGATIVE 01/04/2020 2233   BILIRUBINUR NEGATIVE 01/04/2020 [place] 01/04/2020 2233   PROTEINUR  NEGATIVE 01/04/2020 2233   NITRITE NEGATIVE 01/04/2020 2233   LEUKOCYTESUR NEGATIVE 01/04/2020 2233   Sepsis Labs: @LABRCNTIP (procalcitonin:4,lacticidven:4)  ) Recent Results (from the past 240 hour(s))  Culture, blood (Routine x 2)     Status: None   Collection Time: 01/04/20  5:47 PM   Specimen: BLOOD  Result Value Ref Range Status   Specimen Description BLOOD SITE NOT SPECIFIED  Final   Special Requests   Final    BOTTLES DRAWN AEROBIC AND ANAEROBIC Blood Culture adequate volume   Culture   Final    NO GROWTH 5 DAYS Performed at [facility], [place] [address], [place], [place] [address]    Report Status 01/09/2020 FINAL  Final  Culture, blood (Routine x 2)     Status: None   Collection Time: 01/04/20  8:52 PM   Specimen: BLOOD  Result Value Ref Range Status   Specimen Description BLOOD LEFT ANTECUBITAL  Final   Special Requests   Final    BOTTLES DRAWN AEROBIC AND ANAEROBIC Blood Culture adequate volume   Culture   Final    NO GROWTH 5 DAYS Performed at [facility], [place] [address], [place], [place] [address]    Report Status  01/09/2020 FINAL  Final  SARS CORONAVIRUS 2 (TAT 6-24 HRS) Nasopharyngeal Nasopharyngeal Swab     Status: None   Collection Time: 01/04/20  9:56 PM   Specimen: Nasopharyngeal Swab  Result Value Ref Range Status   SARS Coronavirus 2 NEGATIVE NEGATIVE Final    Comment: (NOTE) SARS-CoV-2 target nucleic acids are NOT DETECTED.  The SARS-CoV-2 RNA is generally detectable in upper and lower respiratory specimens during the acute phase of infection. Negative results do not preclude SARS-CoV-2 infection, do not rule out co-infections with other pathogens, and should not be used as the sole basis for treatment or other patient management decisions. Negative results must be combined with clinical observations, patient history, and epidemiological information. The expected result is Negative.  Fact Sheet for Patients: SugarRoll.be  Fact Sheet for Healthcare Providers: https://www.woods-mathews.com/  This test is not yet approved or cleared by the [place] FDA and  has been authorized for detection and/or diagnosis of SARS-CoV-2 by FDA under an Emergency Use Authorization (EUA). This EUA will remain  in effect (meaning this test can be used) for the duration of the COVID-19 declaration under Se ction 564(b)(1) of the Act, 21 U.S.C. section 360bbb-3(b)(1), unless the authorization is terminated or revoked sooner.  Performed at [facility], [place] [address] [place], [place] [address]   Aerobic/Anaerobic Culture (surgical/deep wound)     Status: None   Collection Time: 01/05/20 10:19 PM   Specimen: Wound  Result Value Ref Range Status   Specimen Description WOUND LEFT LEG  Final   Special Requests NONE  Final   Gram Stain   Final    FEW WBC PRESENT, PREDOMINANTLY PMN RARE GRAM NEGATIVE RODS RARE GRAM POSITIVE COCCI IN CHAINS    Culture   Final    FEW PROVIDENCIA RETTGERI FEW PSEUDOMONAS AERUGINOSA NO ANAEROBES ISOLATED Performed at  [facility], [address] [place], [place] [address]    Report Status 01/10/2020 FINAL  Final   Organism ID, Bacteria PROVIDENCIA RETTGERI  Final   Organism ID, Bacteria PSEUDOMONAS AERUGINOSA  Final      Susceptibility   Pseudomonas aeruginosa - MIC*    CEFTAZIDIME <=1 SENSITIVE Sensitive     CIPROFLOXACIN >=4 RESISTANT Resistant  GENTAMICIN <=1 SENSITIVE Sensitive     IMIPENEM 1 SENSITIVE Sensitive     CEFEPIME 1 SENSITIVE Sensitive     * FEW PSEUDOMONAS AERUGINOSA   Providencia rettgeri - MIC*    AMPICILLIN >=32 RESISTANT Resistant     CEFAZOLIN >=64 RESISTANT Resistant     CEFEPIME 1 SENSITIVE Sensitive     CEFTAZIDIME <=1 SENSITIVE Sensitive     CIPROFLOXACIN INTERMEDIATE Intermediate     GENTAMICIN <=1 SENSITIVE Sensitive     IMIPENEM 1 SENSITIVE Sensitive     TRIMETH/SULFA <=20 SENSITIVE Sensitive     AMPICILLIN/SULBACTAM <=2 SENSITIVE Sensitive     PIP/TAZO <=4 SENSITIVE Sensitive     * FEW PROVIDENCIA RETTGERI  Aerobic/Anaerobic Culture (surgical/deep wound)     Status: None (Preliminary result)   Collection Time: 01/08/20  1:07 PM   Specimen: Leg, Left; Wound  Result Value Ref Range Status   Specimen Description WOUND  Final   Special Requests LEFT LOWER LEG SPEC A  Final   Gram Stain   Final    NO WBC SEEN NO ORGANISMS SEEN Performed at [facility], [address] [place], [place] [address]    Culture   Final    CULTURE REINCUBATED FOR BETTER GROWTH NO ANAEROBES ISOLATED; CULTURE IN PROGRESS FOR 5 DAYS    Report Status PENDING  Incomplete      Studies: No results found.  Scheduled Meds: . Chlorhexidine Gluconate Cloth  6 each Topical Daily  . docusate sodium  100 mg Oral Daily  . enoxaparin (LOVENOX) injection  40 mg Subcutaneous Daily  . ferrous sulfate  325 mg Oral BID WC  . insulin aspart  0-15 Units Subcutaneous TID WC  . insulin aspart  0-5 Units Subcutaneous QHS  . magnesium oxide  400 mg Oral Once  . predniSONE  60 mg Oral  Q breakfast  . pregabalin  100 mg Oral TID  . sodium chloride flush  10-40 mL Intracatheter Q12H  . sulfamethoxazole-trimethoprim  1 tablet Oral Once per day on Mon Wed Fri    Continuous Infusions: . ceFEPime (MAXIPIME) IV 2 g (01/10/20 1244)  . lactated ringers Stopped (01/04/20 2002)  . lactated ringers 100 mL/hr at 01/08/20 1221  . lactated ringers 10 mL/hr at 01/08/20 2205     LOS: 6 days     Kaitlyn Friendly, MD [facility]  If 7PM-7AM, please contact night-coverage [email or web address] 01/10/2020, 1:16 PM

## 2020-01-10 NOTE — Plan of Care (Signed)

## 2020-01-11 DIAGNOSIS — L88 Pyoderma gangrenosum: Secondary | ICD-10-CM | POA: Diagnosis not present

## 2020-01-11 DIAGNOSIS — E1169 Type 2 diabetes mellitus with other specified complication: Secondary | ICD-10-CM | POA: Diagnosis not present

## 2020-01-11 DIAGNOSIS — L03116 Cellulitis of left lower limb: Secondary | ICD-10-CM | POA: Diagnosis not present

## 2020-01-11 LAB — CBC WITH DIFFERENTIAL/PLATELET
Abs Immature Granulocytes: 0.08 10*3/uL — ABNORMAL HIGH (ref 0.00–0.07)
Basophils Absolute: 0.1 10*3/uL (ref 0.0–0.1)
Basophils Relative: 1 %
Eosinophils Absolute: 0.1 10*3/uL (ref 0.0–0.5)
Eosinophils Relative: 1 %
HCT: 33 % — ABNORMAL LOW (ref 36.0–46.0)
Hemoglobin: 9.7 g/dL — ABNORMAL LOW (ref 12.0–15.0)
Immature Granulocytes: 1 %
Lymphocytes Relative: 27 %
Lymphs Abs: 3.3 10*3/uL (ref 0.7–4.0)
MCH: 22.6 pg — ABNORMAL LOW (ref 26.0–34.0)
MCHC: 29.4 g/dL — ABNORMAL LOW (ref 30.0–36.0)
MCV: 76.9 fL — ABNORMAL LOW (ref 80.0–100.0)
Monocytes Absolute: 0.7 10*3/uL (ref 0.1–1.0)
Monocytes Relative: 6 %
Neutro Abs: 7.7 10*3/uL (ref 1.7–7.7)
Neutrophils Relative %: 64 %
Platelets: 414 10*3/uL — ABNORMAL HIGH (ref 150–400)
RBC: 4.29 MIL/uL (ref 3.87–5.11)
RDW: 18.3 % — ABNORMAL HIGH (ref 11.5–15.5)
WBC: 12 10*3/uL — ABNORMAL HIGH (ref 4.0–10.5)
nRBC: 0 % (ref 0.0–0.2)

## 2020-01-11 LAB — GLUCOSE, CAPILLARY
Glucose-Capillary: 118 mg/dL — ABNORMAL HIGH (ref 70–99)
Glucose-Capillary: 171 mg/dL — ABNORMAL HIGH (ref 70–99)
Glucose-Capillary: 207 mg/dL — ABNORMAL HIGH (ref 70–99)
Glucose-Capillary: 183 mg/dL — ABNORMAL HIGH (ref 70–99)

## 2020-01-11 LAB — BASIC METABOLIC PANEL
Anion gap: 11 (ref 5–15)
BUN: 15 mg/dL (ref 6–20)
CO2: 21 mmol/L — ABNORMAL LOW (ref 22–32)
Calcium: 9.1 mg/dL (ref 8.9–10.3)
Chloride: 105 mmol/L (ref 98–111)
Creatinine, Ser: 0.71 mg/dL (ref 0.44–1.00)
GFR, Estimated: >60 mL/min (ref >60)
Glucose, Bld: 120 mg/dL — ABNORMAL HIGH (ref 70–99)
Potassium: 3.7 mmol/L (ref 3.5–5.1)
Sodium: 137 mmol/L (ref 135–145)

## 2020-01-11 MED ORDER — CLOTRIMAZOLE 2 % VA CREA
1.0000 | TOPICAL_CREAM | Freq: Every day | VAGINAL | Status: DC
  Administered 2020-01-11 – 2020-01-12 (×2): 1 via VAGINAL
  Filled 2020-01-11 (×2): qty 21

## 2020-01-11 MED ORDER — CYCLOBENZAPRINE HCL 5 MG PO TABS
5.0000 mg | ORAL_TABLET | Freq: Two times a day (BID) | ORAL | Status: DC | PRN
  Administered 2020-01-12 (×2): 5 mg via ORAL
  Filled 2020-01-11 (×3): qty 1

## 2020-01-11 MED ORDER — OXYCODONE HCL 5 MG PO TABS
5.0000 mg | ORAL_TABLET | ORAL | Status: DC | PRN
  Administered 2020-01-11 – 2020-01-13 (×7): 5 mg via ORAL
  Filled 2020-01-11 (×7): qty 1

## 2020-01-11 MED ORDER — FLUCONAZOLE 150 MG PO TABS
150.0000 mg | ORAL_TABLET | Freq: Once | ORAL | Status: AC
  Administered 2020-01-11: 150 mg via ORAL
  Filled 2020-01-11: qty 1

## 2020-01-11 NOTE — Progress Notes (Signed)
PROGRESS NOTE  [name] NGE:[id number] DOB: 1985/07/23 DOA: 01/04/2020 PCP: Patient, No Pcp Per  HPI/Recap of past 24 hours: HPI from Dr Loleta Books [name] is a 35 y.o. F with history of type 2 diabetes on oral hypoglycemics, ?RA,  pyoderma gangrenosum and chronic left leg wound with polymicrobial infection presents for evaluation of fever, worsening leg pain and purulent drainage. Unfortunately she had worsening left leg wound for last 3 years, poor outpatient support and follow-up, multiple previous interventions including hydrotherapy and last seen at wound care clinic 1 week ago presents to the ER because it is started smelling foul and draining purulent. Reported temperature 102 at home. In the ED, afebrile but tachycardic. Lactic acid 3.1. Patient also on Metformin at home. Treated with IV fluids and antibiotics with sepsis protocol and admitted to the hospital.    Today, patient still complains of muscle spasm, although reports better control with overall pain.  Reports yeast infection due to antibiotic use.      Assessment/Plan: Principal Problem:   Chronic osteomyelitis involving lower leg, left (HCC) Active Problems:   Wound infection   Diabetes mellitus type 2 in obese (HCC)   Obesity, Class III, BMI 40-49.9 (morbid obesity) (HCC)   Chronic osteomyelitis of left lower leg (HCC)   Pyoderma gangrenosa   Cellulitis of left lower extremity   Acute on chronic left leg cellulitis and myositis in the setting of pyoderma gangrenosum s/p debridement on 01/08/20 Currently afebrile, with mild leukocytosis MRI with no evidence of osteo, or abscess Wound culture growing Pseudomonas, providentia Appreciate plastic on board, discussed with Dr. Marla Roe, recommend starting patient on steroids for pyoderma gangrenosum Continue prednisone 60 mg daily Appreciate ID on board- D/C Zosyn, switch to cefepime for a total of 4 weeks as per ID Pain management, Flexeril for  spasms Monitor closely  Diabetes mellitus type 2 A1c 6.7, controlled Continue SSI, Accu-Cheks, hypoglycemic protocol At home on Farxiga, Metformin  Microcytic anemia/iron deficiency anemia Anemia panel sats 6%, iron 18 Continue oral iron supplementation  Vaginal candidiasis Likely 2/2 antibiotic and steroid use 1 dose of fluconazole in addition to vaginal suppositories x3 days  ??History of ?inflammatory bowel disease (undiagnosed) Has seen multiple providers in the [facility] system (Dermatology, Rheumatology, GI, ID), diagnosed with possible ??inflammatory arthritis from underlying chronic hep C, rec colonoscopy to evaluate, yet to schedule    Insurance no longer approving Humira for patient solely for pyoderma gangrenosum Advised pt to schedule colonoscopy asap for further evaluation   Morbid obesity Lifestyle modification advised       Malnutrition Type:      Malnutrition Characteristics:      Nutrition Interventions:       Estimated body mass index is 40.64 kg/m as calculated from the following:   Height as of this encounter: 5\' 8"  (1.727 m).   Weight as of this encounter: 121.2 kg.     Code Status: Full  Family Communication: Discussed with patient  Disposition Plan: Status is: Inpatient  Remains inpatient appropriate because:Inpatient level of care appropriate due to severity of illness   Dispo:  Patient From: Home  Planned Disposition: Home with Health Care Svc  Expected discharge date: 01/13/2020  Medically stable for discharge: No    Consultants:  Plastic surgery  ID  Procedures:  S/p debridement on 01/08/2020  Antimicrobials:  Cefepime  DVT prophylaxis: Lovenox   Objective: Vitals:   01/10/20 2006 01/11/20 0508 01/11/20 0815 01/11/20 1314  BP: 122/77 (!) 142/92 117/72 133/87  Pulse:  79 81 72 90  Resp: 18 18 17 18   Temp: 98.1 F (36.7 C) 98.3 F (36.8 C) 98.3 F (36.8 C) 98.7 F (37.1 C)  TempSrc: Oral Oral Oral Oral   SpO2: 100% 98% 96% 98%  Weight:      Height:        Intake/Output Summary (Last 24 hours) at 01/11/2020 1437 Last data filed at 01/11/2020 0900 Gross per 24 hour  Intake 240 ml  Output --  Net 240 ml   Filed Weights   01/04/20 2228 01/07/20 2339 01/08/20 1152  Weight: 123.8 kg 121.2 kg 121.2 kg    Exam:  General: NAD  Cardiovascular: S1, S2 present  Respiratory: CTAB  Abdomen: Soft, nontender, nondistended, bowel sounds present  Musculoskeletal: No bilateral pedal edema noted, left leg in Ace wrap, C/D/I, wound VAC in place  Skin: Normal  Psychiatry: Normal mood    Data Reviewed: CBC: Recent Labs  Lab 01/04/20 1746 01/05/20 0111 01/06/20 0426 01/07/20 0502 01/08/20 0158 01/09/20 0410 01/10/20 0416 01/11/20 0332  WBC 11.1*   < > 7.7 7.8 8.1 10.8* 11.9* 12.0*  NEUTROABS 6.6  --  4.2  --   --  8.0* 7.5 7.7  HGB 9.6*   < > 8.1* 8.7* 9.5* 9.3* 9.5* 9.7*  HCT 33.4*   < > 27.4* 26.8* 31.5* 31.5* 32.6* 33.0*  MCV 79.3*   < > 76.5* 71.8* 74.8* 76.3* 77.3* 76.9*  PLT 364   < > 325 285 408* 400 436* 414*   < > = values in this interval not displayed.   Basic Metabolic Panel: Recent Labs  Lab 01/06/20 0426 01/07/20 0350 01/08/20 0158 01/09/20 0410 01/10/20 0416 01/11/20 0332  NA 139 139 138 135 137 137  K 4.0 4.6 4.2 3.8 3.7 3.7  CL 109 109 106 104 106 105  CO2 18* 17* 19* 20* 21* 21*  GLUCOSE 112* 115* 116* 188* 130* 120*  BUN 7 8 10 13 13 15   CREATININE 0.66 0.76 0.79 0.79 0.80 0.71  CALCIUM 8.7* 8.7* 9.2 8.9 9.1 9.1  MG 2.0  --   --   --   --   --   PHOS 3.1  --   --   --   --   --    GFR: Estimated Creatinine Clearance: 133.2 mL/min (by C-G formula based on SCr of 0.71 mg/dL). Liver Function Tests: Recent Labs  Lab 01/04/20 1746  AST 24  ALT 41  ALKPHOS 133*  BILITOT 0.5  PROT 7.0  ALBUMIN 3.1*   No results for input(s): LIPASE, AMYLASE in the last 168 hours. No results for input(s): AMMONIA in the last 168 hours. Coagulation  Profile: Recent Labs  Lab 01/04/20 1746  INR 1.1   Cardiac Enzymes: Recent Labs  Lab 01/06/20 0426  CKTOTAL 50   BNP (last 3 results) No results for input(s): PROBNP in the last 8760 hours. HbA1C: No results for input(s): HGBA1C in the last 72 hours. CBG: Recent Labs  Lab 01/10/20 1116 01/10/20 1600 01/10/20 2039 01/11/20 0650 01/11/20 1115  GLUCAP 208* 206* 157* 118* 171*   Lipid Profile: No results for input(s): CHOL, HDL, LDLCALC, TRIG, CHOLHDL, LDLDIRECT in the last 72 hours. Thyroid Function Tests: No results for input(s): TSH, T4TOTAL, FREET4, T3FREE, THYROIDAB in the last 72 hours. Anemia Panel: No results for input(s): VITAMINB12, FOLATE, FERRITIN, TIBC, IRON, RETICCTPCT in the last 72 hours. Urine analysis:    Component Value Date/Time   COLORURINE YELLOW 01/04/2020 2233  APPEARANCEUR CLEAR 01/04/2020 2233   LABSPEC 1.013 01/04/2020 2233   PHURINE 6.0 01/04/2020 2233   GLUCOSEU NEGATIVE 01/04/2020 2233   HGBUR NEGATIVE 01/04/2020 2233   BILIRUBINUR NEGATIVE 01/04/2020 2233   KETONESUR NEGATIVE 01/04/2020 2233   PROTEINUR NEGATIVE 01/04/2020 2233   NITRITE NEGATIVE 01/04/2020 2233   LEUKOCYTESUR NEGATIVE 01/04/2020 2233   Sepsis Labs: @LABRCNTIP (procalcitonin:4,lacticidven:4)  ) Recent Results (from the past 240 hour(s))  Culture, blood (Routine x 2)     Status: None   Collection Time: 01/04/20  5:47 PM   Specimen: BLOOD  Result Value Ref Range Status   Specimen Description BLOOD SITE NOT SPECIFIED  Final   Special Requests   Final    BOTTLES DRAWN AEROBIC AND ANAEROBIC Blood Culture adequate volume   Culture   Final    NO GROWTH 5 DAYS Performed at [facility], [place] [address] [place], [place] [address]    Report Status 01/09/2020 FINAL  Final  Culture, blood (Routine x 2)     Status: None   Collection Time: 01/04/20  8:52 PM   Specimen: BLOOD  Result Value Ref Range Status   Specimen Description BLOOD LEFT ANTECUBITAL  Final    Special Requests   Final    BOTTLES DRAWN AEROBIC AND ANAEROBIC Blood Culture adequate volume   Culture   Final    NO GROWTH 5 DAYS Performed at [facility], [place] [address], [place], [place] [address]    Report Status 01/09/2020 FINAL  Final  SARS CORONAVIRUS 2 (TAT 6-24 HRS) Nasopharyngeal Nasopharyngeal Swab     Status: None   Collection Time: 01/04/20  9:56 PM   Specimen: Nasopharyngeal Swab  Result Value Ref Range Status   SARS Coronavirus 2 NEGATIVE NEGATIVE Final    Comment: (NOTE) SARS-CoV-2 target nucleic acids are NOT DETECTED.  The SARS-CoV-2 RNA is generally detectable in upper and lower respiratory specimens during the acute phase of infection. Negative results do not preclude SARS-CoV-2 infection, do not rule out co-infections with other pathogens, and should not be used as the sole basis for treatment or other patient management decisions. Negative results must be combined with clinical observations, patient history, and epidemiological information. The expected result is Negative.  Fact Sheet for Patients: SugarRoll.be  Fact Sheet for Healthcare Providers: https://www.woods-mathews.com/  This test is not yet approved or cleared by the [place] FDA and  has been authorized for detection and/or diagnosis of SARS-CoV-2 by FDA under an Emergency Use Authorization (EUA). This EUA will remain  in effect (meaning this test can be used) for the duration of the COVID-19 declaration under Se ction 564(b)(1) of the Act, 21 U.S.C. section 360bbb-3(b)(1), unless the authorization is terminated or revoked sooner.  Performed at [facility], [place] [address], [place], [place] [address]   Aerobic/Anaerobic Culture (surgical/deep wound)     Status: None   Collection Time: 01/05/20 10:19 PM   Specimen: Wound  Result Value Ref Range Status   Specimen Description WOUND LEFT LEG  Final   Special Requests NONE  Final    Gram Stain   Final    FEW WBC PRESENT, PREDOMINANTLY PMN RARE GRAM NEGATIVE RODS RARE GRAM POSITIVE COCCI IN CHAINS    Culture   Final    FEW PROVIDENCIA RETTGERI FEW PSEUDOMONAS AERUGINOSA NO ANAEROBES ISOLATED Performed at [facility], [address] [place], [place] [address]    Report Status 01/10/2020 FINAL  Final   Organism ID, Bacteria PROVIDENCIA  RETTGERI  Final   Organism ID, Bacteria PSEUDOMONAS AERUGINOSA  Final      Susceptibility   Pseudomonas aeruginosa - MIC*    CEFTAZIDIME <=1 SENSITIVE Sensitive     CIPROFLOXACIN >=4 RESISTANT Resistant     GENTAMICIN <=1 SENSITIVE Sensitive     IMIPENEM 1 SENSITIVE Sensitive     CEFEPIME 1 SENSITIVE Sensitive     * FEW PSEUDOMONAS AERUGINOSA   Providencia rettgeri - MIC*    AMPICILLIN >=32 RESISTANT Resistant     CEFAZOLIN >=64 RESISTANT Resistant     CEFEPIME 1 SENSITIVE Sensitive     CEFTAZIDIME <=1 SENSITIVE Sensitive     CIPROFLOXACIN INTERMEDIATE Intermediate     GENTAMICIN <=1 SENSITIVE Sensitive     IMIPENEM 1 SENSITIVE Sensitive     TRIMETH/SULFA <=20 SENSITIVE Sensitive     AMPICILLIN/SULBACTAM <=2 SENSITIVE Sensitive     PIP/TAZO <=4 SENSITIVE Sensitive     * FEW PROVIDENCIA RETTGERI  Aerobic/Anaerobic Culture (surgical/deep wound)     Status: None (Preliminary result)   Collection Time: 01/08/20  1:07 PM   Specimen: Leg, Left; Wound  Result Value Ref Range Status   Specimen Description WOUND  Final   Special Requests LEFT LOWER LEG SPEC A  Final   Gram Stain   Final    NO WBC SEEN NO ORGANISMS SEEN Performed at [facility], [address] [place], [place] [address]    Culture   Final    RARE NORMAL SKIN FLORA NO STAPHYLOCOCCUS AUREUS ISOLATED NO GROUP A STREP (S.PYOGENES) ISOLATED NO ANAEROBES ISOLATED; CULTURE IN PROGRESS FOR 5 DAYS    Report Status PENDING  Incomplete      Studies: No results found.  Scheduled Meds: . Chlorhexidine Gluconate Cloth  6 each Topical Daily  .  clotrimazole  1 Applicatorful Vaginal QHS  . docusate sodium  100 mg Oral Daily  . enoxaparin (LOVENOX) injection  40 mg Subcutaneous Daily  . ferrous sulfate  325 mg Oral BID WC  . insulin aspart  0-15 Units Subcutaneous TID WC  . insulin aspart  0-5 Units Subcutaneous QHS  . predniSONE  60 mg Oral Q breakfast  . pregabalin  100 mg Oral TID  . sodium chloride flush  10-40 mL Intracatheter Q12H  . sulfamethoxazole-trimethoprim  1 tablet Oral Once per day on Mon Wed Fri    Continuous Infusions: . ceFEPime (MAXIPIME) IV 2 g (01/11/20 1414)  . lactated ringers 10 mL/hr at 01/08/20 2205     LOS: 7 days     Alma Friendly, MD [facility]  If 7PM-7AM, please contact night-coverage [email or web address] 01/11/2020, 2:37 PM

## 2020-01-12 DIAGNOSIS — L88 Pyoderma gangrenosum: Secondary | ICD-10-CM | POA: Diagnosis not present

## 2020-01-12 DIAGNOSIS — E1169 Type 2 diabetes mellitus with other specified complication: Secondary | ICD-10-CM | POA: Diagnosis not present

## 2020-01-12 DIAGNOSIS — L03116 Cellulitis of left lower limb: Secondary | ICD-10-CM | POA: Diagnosis not present

## 2020-01-12 LAB — CBC WITH DIFFERENTIAL/PLATELET
Abs Immature Granulocytes: 0.14 10*3/uL — ABNORMAL HIGH (ref 0.00–0.07)
Basophils Absolute: 0.1 10*3/uL (ref 0.0–0.1)
Basophils Relative: 1 %
Eosinophils Absolute: 0.1 10*3/uL (ref 0.0–0.5)
Eosinophils Relative: 1 %
HCT: 31.6 % — ABNORMAL LOW (ref 36.0–46.0)
Hemoglobin: 9.7 g/dL — ABNORMAL LOW (ref 12.0–15.0)
Immature Granulocytes: 1 %
Lymphocytes Relative: 24 %
Lymphs Abs: 3.1 10*3/uL (ref 0.7–4.0)
MCH: 23.4 pg — ABNORMAL LOW (ref 26.0–34.0)
MCHC: 30.7 g/dL (ref 30.0–36.0)
MCV: 76.1 fL — ABNORMAL LOW (ref 80.0–100.0)
Monocytes Absolute: 0.8 10*3/uL (ref 0.1–1.0)
Monocytes Relative: 6 %
Neutro Abs: 8.8 10*3/uL — ABNORMAL HIGH (ref 1.7–7.7)
Neutrophils Relative %: 67 %
Platelets: 413 10*3/uL — ABNORMAL HIGH (ref 150–400)
RBC: 4.15 MIL/uL (ref 3.87–5.11)
RDW: 18.6 % — ABNORMAL HIGH (ref 11.5–15.5)
WBC: 13 10*3/uL — ABNORMAL HIGH (ref 4.0–10.5)
nRBC: 0 % (ref 0.0–0.2)

## 2020-01-12 LAB — BASIC METABOLIC PANEL
Anion gap: 12 (ref 5–15)
BUN: 22 mg/dL — ABNORMAL HIGH (ref 6–20)
CO2: 21 mmol/L — ABNORMAL LOW (ref 22–32)
Calcium: 9.3 mg/dL (ref 8.9–10.3)
Chloride: 105 mmol/L (ref 98–111)
Creatinine, Ser: 0.71 mg/dL (ref 0.44–1.00)
GFR, Estimated: >60 mL/min (ref >60)
Glucose, Bld: 136 mg/dL — ABNORMAL HIGH (ref 70–99)
Potassium: 4 mmol/L (ref 3.5–5.1)
Sodium: 138 mmol/L (ref 135–145)

## 2020-01-12 LAB — GLUCOSE, CAPILLARY
Glucose-Capillary: 120 mg/dL — ABNORMAL HIGH (ref 70–99)
Glucose-Capillary: 179 mg/dL — ABNORMAL HIGH (ref 70–99)
Glucose-Capillary: 177 mg/dL — ABNORMAL HIGH (ref 70–99)
Glucose-Capillary: 185 mg/dL — ABNORMAL HIGH (ref 70–99)

## 2020-01-12 MED ORDER — PANTOPRAZOLE SODIUM 40 MG PO TBEC
40.0000 mg | DELAYED_RELEASE_TABLET | Freq: Every day | ORAL | Status: DC
  Administered 2020-01-12 – 2020-01-13 (×2): 40 mg via ORAL
  Filled 2020-01-12 (×2): qty 1

## 2020-01-12 NOTE — TOC Progression Note (Addendum)
Transition of Care [facility]) - Progression Note    Patient Details  Name: [name] MRN: [id number] Date of Birth: Dec 01, 1985  [facility] CM/SW Contact  Jacalyn Lefevre Edson Snowball, RN Phone Number: 01/12/2020, 10:52 AM  Clinical Narrative:     Patient's PCP is Jeanie Sewer, at [facility] in [place]. Patient from home with sister, mother, 37 year old daughter and 67 year old son. VAC has NOT been delivered to room yet will call [facility]. Patient aware of all of below.   Message left for [place] with [facility].   [place] to follow up on if they can accept for [facility] for discharge tomorrow. Awaiting call back.   Jason with [facility] declined patient.   Will start calling other home health agencies   [facility] with [place] , he will check with office and get back to NCM.   [place] [facility] , spoke with on call nurse, was instructed to call back tomorrow morning.   Called [facility] intake [phone] , on call requested NCM to fax referral to [facility] intake at [phone] and call back tomorrow morning. All Medicaid's have to be pre approved. Referral faxed     [facility] Mack[facility] declined referral due to staffing.  Malachy Mood with [facility] declined due to insurance.   [facility]ada Dy with [facility] declined due to patient's address they do not service the area.   Left messages for :   Amy with [facility]. Amy unable to accept referral due to being at capacity for Medicaid patients.   [place] with [facility] . [place] with [facility] unable to accept due to staffing   [facility] with [facility]. H[facility] Sauer unable to accept referral due to not having staff in the area to cover.   Expected Discharge Plan: [place] Barriers to Discharge: Continued Medical Work up  Expected Discharge Plan and Services Expected Discharge Plan: [place]   Discharge Planning Services:  CM Consult Post Acute Care Choice: Home Health                                         Social Determinants of Health (SDOH) Interventions    Readmission Risk Interventions No flowsheet data found.

## 2020-01-12 NOTE — Progress Notes (Signed)
PROGRESS NOTE  [name] [id number] DOB: 23-Apr-1985 DOA: 01/04/2020 PCP: Patient, No Pcp Per  HPI/Recap of past 24 hours: HPI from Dr [name] [name] is a 35 y.o. F with history of type 2 diabetes on oral hypoglycemics, ?RA,  pyoderma gangrenosum and chronic left leg wound with polymicrobial infection presents for evaluation of fever, worsening leg pain and purulent drainage. Unfortunately she had worsening left leg wound for last 3 years, poor outpatient support and follow-up, multiple previous interventions including hydrotherapy and last seen at wound care clinic 1 week ago presents to the ER because it is started smelling foul and draining purulent. Reported temperature 102 at home. In the ED, afebrile but tachycardic. Lactic acid 3.1. Patient also on Metformin at home. Treated with IV fluids and antibiotics with sepsis protocol and admitted to the hospital.     Today, patient denies any new complaints.      Assessment/Plan: Principal Problem:   Chronic osteomyelitis involving lower leg, left (HCC) Active Problems:   Wound infection   Diabetes mellitus type 2 in obese (HCC)   Obesity, Class III, BMI 40-49.9 (morbid obesity) (HCC)   Chronic osteomyelitis of left lower leg (HCC)   Pyoderma gangrenosa   Cellulitis of left lower extremity   Acute on chronic left leg cellulitis and myositis in the setting of pyoderma gangrenosum s/p debridement on 01/08/20 Currently afebrile, with leukocytosis (on steroids) MRI with no evidence of osteo, or abscess Wound culture growing Pseudomonas, providentia Appreciate plastic on board, discussed with Dr. Marla Reyes, recommend starting patient on steroids for pyoderma gangrenosum Continue prednisone 60 mg daily Appreciate ID on board- D/C Zosyn, switch to cefepime for a total of 4 weeks as per ID Pain management, Flexeril for spasms Monitor closely  Diabetes mellitus type 2 A1c 6.7, controlled Continue SSI, Accu-Cheks,  hypoglycemic protocol At home on Farxiga, Metformin  Microcytic anemia/iron deficiency anemia Anemia panel sats 6%, iron 18 Continue oral iron supplementation  Vaginal candidiasis Likely 2/2 antibiotic and steroid use 1 dose of fluconazole in addition to vaginal suppositories x3 days  ??History of ?inflammatory bowel disease (undiagnosed) Has seen multiple providers in the [facility] system (Dermatology, Rheumatology, GI, ID), diagnosed with possible ??inflammatory arthritis from underlying chronic hep C, rec colonoscopy to evaluate, yet to schedule    Insurance no longer approving Humira for patient solely for pyoderma gangrenosum Advised pt to schedule colonoscopy asap for further evaluation   Morbid obesity Lifestyle modification advised       Malnutrition Type:      Malnutrition Characteristics:      Nutrition Interventions:       Estimated body mass index is 40.64 kg/m as calculated from the following:   Height as of this encounter: 5\' 8"  (1.727 m).   Weight as of this encounter: 121.2 kg.     Code Status: Full  Family Communication: Discussed with patient  Disposition Plan: Status is: Inpatient  Remains inpatient appropriate because:Inpatient level of care appropriate due to severity of illness   Dispo:  Patient From: Home  Planned Disposition: Home with Health Care S[facility]  Expected discharge date: 01/13/2020  Medically stable for discharge: No    Consultants:  Plastic surgery  ID  Procedures:  S/p debridement on 01/08/2020  Antimicrobials:  Cefepime  DVT prophylaxis: Lovenox   Objective: Vitals:   01/11/20 1314 01/11/20 1946 01/12/20 0658 01/12/20 0900  BP: 133/87 124/80 118/68 109/71  Pulse: 90 79 75 79  Resp: 18 18 18 16   Temp: 98.7 F (37.1  C) 99.1 F (37.3 C) 97.8 F (36.6 C) 98.2 F (36.8 C)  TempSrc: Oral Oral Oral Oral  SpO2: 98% 99% 99% 98%  Weight:      Height:        Intake/Output Summary (Last 24 hours) at 01/12/2020  1355 Last data filed at 01/12/2020 0900 Gross per 24 hour  Intake 180 ml  Output 50 ml  Net 130 ml   Filed Weights   01/04/20 2228 01/07/20 2339 01/08/20 1152  Weight: 123.8 kg 121.2 kg 121.2 kg    Exam:  General: NAD  Cardiovascular: S1, S2 present  Respiratory: CTAB  Abdomen: Soft, nontender, nondistended, bowel sounds present  Musculoskeletal: No bilateral pedal edema noted, left leg in Ace wrap, C/D/I, wound VAC in place  Skin: Normal  Psychiatry: Normal mood    Data Reviewed: CBC: Recent Labs  Lab 01/06/20 0426 01/07/20 0502 01/08/20 0158 01/09/20 0410 01/10/20 0416 01/11/20 0332 01/12/20 0332  WBC 7.7   < > 8.1 10.8* 11.9* 12.0* 13.0*  NEUTROABS 4.2  --   --  8.0* 7.5 7.7 8.8*  HGB 8.1*   < > 9.5* 9.3* 9.5* 9.7* 9.7*  HCT 27.4*   < > 31.5* 31.5* 32.6* 33.0* 31.6*  MCV 76.5*   < > 74.8* 76.3* 77.3* 76.9* 76.1*  PLT 325   < > 408* 400 436* 414* 413*   < > = values in this interval not displayed.   Basic Metabolic Panel: Recent Labs  Lab 01/06/20 0426 01/07/20 0350 01/08/20 0158 01/09/20 0410 01/10/20 0416 01/11/20 0332 01/12/20 0332  NA 139   < > 138 135 137 137 138  K 4.0   < > 4.2 3.8 3.7 3.7 4.0  CL 109   < > 106 104 106 105 105  CO2 18*   < > 19* 20* 21* 21* 21*  GLUCOSE 112*   < > 116* 188* 130* 120* 136*  BUN 7   < > 10 13 13 15  22*  CREATININE 0.66   < > 0.79 0.79 0.80 0.71 0.71  CALCIUM 8.7*   < > 9.2 8.9 9.1 9.1 9.3  MG 2.0  --   --   --   --   --   --   PHOS 3.1  --   --   --   --   --   --    < > = values in this interval not displayed.   GFR: Estimated Creatinine Clearance: 133.2 mL/min (by C-G formula based on SCr of 0.71 mg/dL). Liver Function Tests: No results for input(s): AST, ALT, ALKPHOS, BILITOT, PROT, ALBUMIN in the last 168 hours. No results for input(s): LIPASE, AMYLASE in the last 168 hours. No results for input(s): AMMONIA in the last 168 hours. Coagulation Profile: No results for input(s): INR, PROTIME in the  last 168 hours. Cardiac Enzymes: Recent Labs  Lab 01/06/20 0426  CKTOTAL 50   BNP (last 3 results) No results for input(s): PROBNP in the last 8760 hours. HbA1C: No results for input(s): HGBA1C in the last 72 hours. CBG: Recent Labs  Lab 01/11/20 1115 01/11/20 1612 01/11/20 1945 01/12/20 0659 01/12/20 1132  GLUCAP 171* 207* 183* 120* 179*   Lipid Profile: No results for input(s): CHOL, HDL, LDLCALC, TRIG, CHOLHDL, LDLDIRECT in the last 72 hours. Thyroid Function Tests: No results for input(s): TSH, T4TOTAL, FREET4, T3FREE, THYROIDAB in the last 72 hours. Anemia Panel: No results for input(s): VITAMINB12, FOLATE, FERRITIN, TIBC, IRON, RETICCTPCT in the last 72  hours. Urine analysis:    Component Value Date/Time   COLORURINE YELLOW 01/04/2020 [place] 01/04/2020 2233   LABSPEC 1.013 01/04/2020 2233   PHURINE 6.0 01/04/2020 2233   GLUCOSEU NEGATIVE 01/04/2020 2233   HGBUR NEGATIVE 01/04/2020 2233   BILIRUBINUR NEGATIVE 01/04/2020 2233   KETONESUR NEGATIVE 01/04/2020 2233   PROTEINUR NEGATIVE 01/04/2020 2233   NITRITE NEGATIVE 01/04/2020 2233   LEUKOCYTESUR NEGATIVE 01/04/2020 2233   Sepsis Labs: @LABRCNTIP (procalcitonin:4,lacticidven:4)  ) Recent Results (from the past 240 hour(s))  Culture, blood (Routine x 2)     Status: None   Collection Time: 01/04/20  5:47 PM   Specimen: BLOOD  Result Value Ref Range Status   Specimen Description BLOOD SITE NOT SPECIFIED  Final   Special Requests   Final    BOTTLES DRAWN AEROBIC AND ANAEROBIC Blood Culture adequate volume   Culture   Final    NO GROWTH 5 DAYS Performed at [facility], [place] [address], [place], [place] [address]    Report Status 01/09/2020 FINAL  Final  Culture, blood (Routine x 2)     Status: None   Collection Time: 01/04/20  8:52 PM   Specimen: BLOOD  Result Value Ref Range Status   Specimen Description BLOOD LEFT ANTECUBITAL  Final   Special Requests   Final    BOTTLES DRAWN  AEROBIC AND ANAEROBIC Blood Culture adequate volume   Culture   Final    NO GROWTH 5 DAYS Performed at [facility], [place] [address] [place], [place] [address]    Report Status 01/09/2020 FINAL  Final  SARS CORONAVIRUS 2 (TAT 6-24 HRS) Nasopharyngeal Nasopharyngeal Swab     Status: None   Collection Time: 01/04/20  9:56 PM   Specimen: Nasopharyngeal Swab  Result Value Ref Range Status   SARS Coronavirus 2 NEGATIVE NEGATIVE Final    Comment: (NOTE) SARS-CoV-2 target nucleic acids are NOT DETECTED.  The SARS-CoV-2 RNA is generally detectable in upper and lower respiratory specimens during the acute phase of infection. Negative results do not preclude SARS-CoV-2 infection, do not rule out co-infections with other pathogens, and should not be used as the sole basis for treatment or other patient management decisions. Negative results must be combined with clinical observations, patient history, and epidemiological information. The expected result is Negative.  Fact Sheet for Patients: SugarRoll.be  Fact Sheet for Healthcare Providers: https://www.woods-mathews.com/  This test is not yet approved or cleared by the [place] FDA and  has been authorized for detection and/or diagnosis of SARS-CoV-2 by FDA under an Emergency Use Authorization (EUA). This EUA will remain  in effect (meaning this test can be used) for the duration of the COVID-19 declaration under Se ction 564(b)(1) of the Act, 21 U.S.C. section 360bbb-3(b)(1), unless the authorization is terminated or revoked sooner.  Performed at [facility], [place] [address], [place], [place] [address]   Aerobic/Anaerobic Culture (surgical/deep wound)     Status: None   Collection Time: 01/05/20 10:19 PM   Specimen: Wound  Result Value Ref Range Status   Specimen Description WOUND LEFT LEG  Final   Special Requests NONE  Final   Gram Stain   Final    FEW WBC PRESENT,  PREDOMINANTLY PMN RARE GRAM NEGATIVE RODS RARE GRAM POSITIVE COCCI IN CHAINS    Culture   Final    FEW PROVIDENCIA RETTGERI FEW PSEUDOMONAS AERUGINOSA NO ANAEROBES ISOLATED Performed at [facility], [address],  [place] [address]    Report Status 01/10/2020 FINAL  Final   Organism ID, Bacteria PROVIDENCIA RETTGERI  Final   Organism ID, Bacteria PSEUDOMONAS AERUGINOSA  Final      Susceptibility   Pseudomonas aeruginosa - MIC*    CEFTAZIDIME <=1 SENSITIVE Sensitive     CIPROFLOXACIN >=4 RESISTANT Resistant     GENTAMICIN <=1 SENSITIVE Sensitive     IMIPENEM 1 SENSITIVE Sensitive     CEFEPIME 1 SENSITIVE Sensitive     * FEW PSEUDOMONAS AERUGINOSA   Providencia rettgeri - MIC*    AMPICILLIN >=32 RESISTANT Resistant     CEFAZOLIN >=64 RESISTANT Resistant     CEFEPIME 1 SENSITIVE Sensitive     CEFTAZIDIME <=1 SENSITIVE Sensitive     CIPROFLOXACIN INTERMEDIATE Intermediate     GENTAMICIN <=1 SENSITIVE Sensitive     IMIPENEM 1 SENSITIVE Sensitive     TRIMETH/SULFA <=20 SENSITIVE Sensitive     AMPICILLIN/SULBACTAM <=2 SENSITIVE Sensitive     PIP/TAZO <=4 SENSITIVE Sensitive     * FEW PROVIDENCIA RETTGERI  Aerobic/Anaerobic Culture (surgical/deep wound)     Status: None (Preliminary result)   Collection Time: 01/08/20  1:07 PM   Specimen: Leg, Left; Wound  Result Value Ref Range Status   Specimen Description WOUND  Final   Special Requests LEFT LOWER LEG SPEC A  Final   Gram Stain   Final    NO WBC SEEN NO ORGANISMS SEEN Performed at [facility], [address], [place], [place] [address]    Culture   Final    RARE NORMAL SKIN FLORA NO STAPHYLOCOCCUS AUREUS ISOLATED NO GROUP A STREP (S.PYOGENES) ISOLATED NO ANAEROBES ISOLATED; CULTURE IN PROGRESS FOR 5 DAYS    Report Status PENDING  Incomplete      Studies: No results found.  Scheduled Meds: . Chlorhexidine Gluconate Cloth  6 each Topical Daily  . clotrimazole  1 Applicatorful Vaginal QHS  .  docusate sodium  100 mg Oral Daily  . enoxaparin (LOVENOX) injection  40 mg Subcutaneous Daily  . ferrous sulfate  325 mg Oral BID WC  . insulin aspart  0-15 Units Subcutaneous TID WC  . insulin aspart  0-5 Units Subcutaneous QHS  . pantoprazole  40 mg Oral Daily  . predniSONE  60 mg Oral Q breakfast  . pregabalin  100 mg Oral TID  . sodium chloride flush  10-40 mL Intracatheter Q12H  . sulfamethoxazole-trimethoprim  1 tablet Oral Once per day on Mon Wed Fri    Continuous Infusions: . ceFEPime (MAXIPIME) IV 2 g (01/12/20 1304)  . lactated ringers 10 mL/hr at 01/08/20 2205     LOS: 8 days     Alma Friendly, MD [facility]  If 7PM-7AM, please contact night-coverage [email or web address] 01/12/2020, 1:55 PM

## 2020-01-13 DIAGNOSIS — L03116 Cellulitis of left lower limb: Secondary | ICD-10-CM | POA: Diagnosis not present

## 2020-01-13 DIAGNOSIS — E1169 Type 2 diabetes mellitus with other specified complication: Secondary | ICD-10-CM | POA: Diagnosis not present

## 2020-01-13 DIAGNOSIS — L88 Pyoderma gangrenosum: Secondary | ICD-10-CM | POA: Diagnosis not present

## 2020-01-13 LAB — GLUCOSE, CAPILLARY
Glucose-Capillary: 119 mg/dL — ABNORMAL HIGH (ref 70–99)
Glucose-Capillary: 168 mg/dL — ABNORMAL HIGH (ref 70–99)

## 2020-01-13 LAB — AEROBIC/ANAEROBIC CULTURE W GRAM STAIN (SURGICAL/DEEP WOUND)
Culture: NORMAL
Gram Stain: NONE SEEN

## 2020-01-13 MED ORDER — SULFAMETHOXAZOLE-TRIMETHOPRIM 800-160 MG PO TABS: 1.0000 | ORAL_TABLET | ORAL | 0 refills | Status: AC

## 2020-01-13 MED ORDER — FLUCONAZOLE 150 MG PO TABS: 150.0000 mg | ORAL_TABLET | ORAL | 0 refills | Status: DC | PRN

## 2020-01-13 MED ORDER — OXYCODONE HCL 5 MG PO TABS: 5.0000 mg | ORAL_TABLET | Freq: Four times a day (QID) | ORAL | 0 refills | Status: AC | PRN

## 2020-01-13 MED ORDER — PREDNISONE 10 MG PO TABS
ORAL_TABLET | ORAL | 0 refills | Status: AC
Start: 2020-01-13 — End: 2020-02-07

## 2020-01-13 MED ORDER — HEPARIN SOD (PORK) LOCK FLUSH 100 UNIT/ML IV SOLN
250.0000 [IU] | INTRAVENOUS | Status: AC | PRN
  Administered 2020-01-13: 250 [IU]
  Filled 2020-01-13: qty 2.5

## 2020-01-13 MED ORDER — CEFEPIME IV (FOR PTA / DISCHARGE USE ONLY): 2.0000 g | Freq: Three times a day (TID) | INTRAVENOUS | 0 refills | Status: AC

## 2020-01-13 MED ORDER — CYCLOBENZAPRINE HCL 5 MG PO TABS: 5.0000 mg | ORAL_TABLET | Freq: Two times a day (BID) | ORAL | 0 refills | Status: AC | PRN

## 2020-01-13 MED ORDER — PANTOPRAZOLE SODIUM 40 MG PO TBEC: 40.0000 mg | DELAYED_RELEASE_TABLET | Freq: Two times a day (BID) | ORAL | 0 refills | Status: AC

## 2020-01-13 MED ORDER — FERROUS SULFATE 325 (65 FE) MG PO TABS: 325.0000 mg | ORAL_TABLET | Freq: Two times a day (BID) | ORAL | 0 refills | Status: AC

## 2020-01-13 MED ORDER — DOCUSATE SODIUM 100 MG PO CAPS: 100.0000 mg | ORAL_CAPSULE | Freq: Every day | ORAL | 0 refills | Status: DC

## 2020-01-13 NOTE — Progress Notes (Signed)
Deneen Harts to be discharged home per MD order.  Discussed prescriptions and follow up appointments with the patient. Prescriptions given to patient, medication list explained in detail. Pt verbalized understanding. Pt is going home with a PICC line to the right upper arm, IV team heparin flushed it and capped it off. Pt also discharging with a portable wound vac, explained in detail how to use it and hooked it up for her, and sending her home with extra supplies.  Allergies as of 01/13/2020      Reactions   Carbamazepine Other (See Comments)   Causes aplastic anemia Other reaction(s): Other (See Comments) Causes aplastic anemia "TEGRETOL"    Zolpidem Tartrate Rash   "AMBIEN"       Medication List    STOP taking these medications   methocarbamol 500 MG tablet Commonly known as: ROBAXIN   oxyCODONE-acetaminophen 5-325 MG tablet Commonly known as: PERCOCET/ROXICET     TAKE these medications   acetaminophen 325 MG tablet Commonly known as: TYLENOL Take 650 mg by mouth every 6 (six) hours as needed for mild pain.   ceFEPime  IVPB Commonly known as: MAXIPIME Inject 2 g into the vein every 8 (eight) hours for 27 days. Indication:  Myositis First Dose: Yes Last Day of Therapy:  02/05/20 Labs - Once weekly:  CBC/D and BMP, Labs - Every other week:  ESR and CRP Method of administration: IV Push Method of administration may be changed at the discretion of home infusion pharmacist based upon assessment of the patient and/or caregiver's ability to self-administer the medication ordered.   cyclobenzaprine 5 MG tablet Commonly known as: FLEXERIL Take 1 tablet (5 mg total) by mouth 2 (two) times daily as needed for up to 7 days for muscle spasms.   docusate sodium 100 MG capsule Commonly known as: COLACE Take 1 capsule (100 mg total) by mouth daily.   Farxiga 5 MG Tabs tablet Generic drug: dapagliflozin propanediol Take 5 mg by mouth daily.   ferrous sulfate 325 (65 FE) MG  tablet Take 1 tablet (325 mg total) by mouth 2 (two) times daily with a meal.   fluconazole 150 MG tablet Commonly known as: Diflucan Take 1 tablet (150 mg total) by mouth every 3 (three) days as needed for up to 3 doses.   metFORMIN 500 MG tablet Commonly known as: GLUCOPHAGE Take 500 mg by mouth daily.   oxyCODONE 5 MG immediate release tablet Commonly known as: Oxy IR/ROXICODONE Take 1 tablet (5 mg total) by mouth every 6 (six) hours as needed for up to 7 days for moderate pain or breakthrough pain.   pantoprazole 40 MG tablet Commonly known as: PROTONIX Take 1 tablet (40 mg total) by mouth 2 (two) times daily before a meal.   predniSONE 10 MG tablet Commonly known as: DELTASONE Take 5 tablets (50 mg total) by mouth daily with breakfast for 5 days, THEN 4 tablets (40 mg total) daily with breakfast for 5 days, THEN 3 tablets (30 mg total) daily with breakfast for 5 days, THEN 2 tablets (20 mg total) daily with breakfast for 10 days. Start taking on: January 13, 2020   pregabalin 100 MG capsule Commonly known as: LYRICA Take 100 mg by mouth 3 (three) times daily.   sulfamethoxazole-trimethoprim 800-160 MG tablet Commonly known as: BACTRIM DS Take 1 tablet by mouth 3 (three) times a week for 21 days. Start taking on: January 15, 2020            Discharge Care  Instructions  (From admission, onward)         Start     Ordered   01/13/20 0000  Change dressing on IV access line weekly and PRN  (Home infusion instructions - [facility] )        01/13/20 1118   01/13/20 0000  Discharge wound care:       Comments: Continue wound vac   01/13/20 1042          Vitals:   01/12/20 2012 01/13/20 0638  BP: 126/62 124/73  Pulse: 73 70  Resp: 16 16  Temp: 98 F (36.7 C) (!) 97.4 F (36.3 C)  SpO2: 100% 100%    Pt's pain controlled currently, oxycodone was given last at 1425. No complaints noted.  An After Visit Summary was printed and given to the  patient. Patient escorted via Wheelchair, and discharged home via private auto with sister.  [place] 01/13/2020 4:53 PM

## 2020-01-13 NOTE — Plan of Care (Signed)
  Problem: Activity: Goal: Risk for activity intolerance will decrease Outcome: Progressing   Problem: Coping: Goal: Level of anxiety will decrease Outcome: Progressing   Problem: Pain Managment: Goal: General experience of comfort will improve Outcome: Progressing   Problem: Safety: Goal: Ability to remain free from injury will improve Outcome: Progressing   Problem: Skin Integrity: Goal: Risk for impaired skin integrity will decrease Outcome: Progressing   

## 2020-01-13 NOTE — Discharge Summary (Addendum)
Discharge Summary  [name] MLY:[id number] DOB: 04/13/1985  PCP: Patient, No Pcp Per  Admit date: 01/04/2020 Discharge date: 01/13/2020  Time spent: 40 mins  Recommendations for Outpatient Follow-up:  1. Follow-up with PCP in 1 week 2. Follow-up with dermatology at [facility] as scheduled 3. Follow-up with plastic surgeon Dr. Marla Roe in 1 week   Discharge Diagnoses:  Active Hospital Problems   Diagnosis Date Noted  . Chronic osteomyelitis involving lower leg, left ([place]) 01/04/2020  . Pyoderma gangrenosa   . Cellulitis of left lower extremity   . Wound infection 01/04/2020  . Diabetes mellitus type 2 in obese ([place]) 01/04/2020  . Obesity, Class III, BMI 40-49.9 (morbid obesity) ([place]) 01/04/2020  . Chronic osteomyelitis of left lower leg ([place]) 01/04/2020    Resolved Hospital Problems  No resolved problems to display.    Discharge Condition: Stable  Diet recommendation: Modified carb, heart healthy  Vitals:   01/12/20 2012 01/13/20 0638  BP: 126/62 124/73  Pulse: 73 70  Resp: 16 16  Temp: 98 F (36.7 C) (!) 97.4 F (36.3 C)  SpO2: 100% 100%    History of present illness:  Mrs. [name] is a59 y.o.F with history of type 2 diabetes on oral hypoglycemics, ?RA,  pyoderma gangrenosum and chronic left leg wound with polymicrobial infection presents for evaluation of fever, worsening leg pain and purulent drainage. Unfortunately she had worsening left leg wound for last 3 years, poor outpatient support and follow-up, multiple previous interventions including hydrotherapy and last seen at wound care clinic 1 week ago presents to the ER because it is started smelling foul and draining purulent. Reported temperature 102 at home. In the ED, afebrile but tachycardic. Lactic acid 3.1. Patient also on Metformin at home. Treated with IV fluids and antibiotics with sepsis protocol and admitted to the hospital.    Today, patient denies any new complaints, eager to be discharged.   Extensive discussion about discharge plans follow-up appointments.  Patient verbalized understanding.  Home health RN arranged for further wound care and IV antibiotics.    Hospital Course:  Principal Problem:   Chronic osteomyelitis involving lower leg, left (HCC) Active Problems:   Wound infection   Diabetes mellitus type 2 in obese (HCC)   Obesity, Class III, BMI 40-49.9 (morbid obesity) (HCC)   Chronic osteomyelitis of left lower leg (HCC)   Pyoderma gangrenosa   Cellulitis of left lower extremity      Acute on chronic left leg cellulitis and myositis in the setting of pyoderma gangrenosum s/p debridement on 01/08/20 Currently afebrile, with leukocytosis (on steroids) Sepsis ruled out MRI with no evidence of osteo, or abscess Wound culture growing Pseudomonas, providentia, repeat after debridement shows no growth Plastic surgeon Dr. Marla Roe, recommend tapered dose of steroids over 4 weeks and follow-up with patient's dermatology week for further management ID on board, continue cefepime for total of 4 weeks, Bactrim 3 times daily while on steroids Pain management, Flexeril prn for spasms PPI as patient is on long-term steroid use Follow-up with plastic surgeon, dermatology, ID, wound care clinic and pcp  Diabetes mellitus type 2 A1c 6.7, controlled Continue home Farxiga, Metformin  Microcytic anemia/iron deficiency anemia Anemia panel sats 6%, iron 18 Continue oral iron supplementation, bowel regimen  Vaginal candidiasis Likely 2/2 antibiotic and steroid use PO Fluconazole as needed  ??History of ?inflammatory bowel disease (undiagnosed) Has seen multiple providers in the [facility] system (Dermatology, Rheumatology, GI, ID), diagnosed with possible ??inflammatory arthritis from underlying chronic hep C, rec colonoscopy to  evaluate, yet to schedule    Insurance no longer approving Humira for patient solely for pyoderma gangrenosum Advised pt to schedule colonoscopy asap  for further evaluation   Morbid obesity Lifestyle modification advised     Estimated body mass index is 40.64 kg/m as calculated from the following:   Height as of this encounter: _0  (1.727 m).   Weight as of this encounter: 121.2 kg.    Procedures:  S/p debridement on 01/08/2020  Consultations:  Plastic surgery  ID  Discharge Exam: BP 124/73 (BP Location: Left Arm)   Pulse 70   Temp (!) 97.4 F (36.3 C) (Oral)   Resp 16   Ht _1  (1.727 m)   Wt 121.2 kg   SpO2 100%   BMI 40.64 kg/m   General: NAD Cardiovascular: S1, S2 present Respiratory: CTA B    Discharge Instructions You were cared for by a hospitalist during your hospital stay. If you have any questions about your discharge medications or the care you received while you were in the hospital after you are discharged, you can call the unit and asked to speak with the hospitalist on call if the hospitalist that took care of you is not available. Once you are discharged, your primary care physician will handle any further medical issues. Please note that NO REFILLS for any discharge medications will be authorized once you are discharged, as it is imperative that you return to your primary care physician (or establish a relationship with a primary care physician if you do not have one) for your aftercare needs so that they can reassess your need for medications and monitor your lab values.  Discharge Instructions    [facility] pharmacist to adjust dose for Vancomycin, Aminoglycosides and other anti-infective therapies as requested by physician.   Complete by: As directed    [facility] to provide Cath Flo 35m   Complete by: As directed    Administer for PICC line occlusion and as ordered by physician for other access device issues.   Anaphylaxis Kit: Provided to treat any anaphylactic reaction to the medication being provided to the patient if First Dose or when requested by physician    Complete by: As directed    Epinephrine 139mml vial / amp: Administer 0.22m19m0.22ml58mubcutaneously once for moderate to severe anaphylaxis, nurse to call physician and pharmacy when reaction occurs and call 911 if needed for immediate care   Diphenhydramine 50mg39mIV vial: Administer 25-50mg 91mM PRN for first dose reaction, rash, itching, mild reaction, nurse to call physician and pharmacy when reaction occurs   Sodium Chloride 0.9% NS 500ml I15mdminister if needed for hypovolemic blood pressure drop or as ordered by physician after call to physician with anaphylactic reaction   Change dressing on IV access line weekly and PRN   Complete by: As directed    Diet - low sodium heart healthy   Complete by: As directed    Discharge wound care:   Complete by: As directed    Continue wound vac   Flush IV access with Sodium Chloride 0.9% and Heparin 10 units/ml or 100 units/ml   Complete by: As directed    Home infusion instructions - [facility]   Complete by: As directed    Instructions: Flush IV access with Sodium Chloride 0.9% and Heparin 10units/ml or 100units/ml   Change dressing on IV access line: Weekly and PRN   Instructions Cath Flo 2mg: Ad59mister for PICC Line occlusion and  as ordered by physician for other access device   [facility] pharmacist to adjust dose for: Vancomycin, Aminoglycosides and other anti-infective therapies as requested by physician   Increase activity slowly   Complete by: As directed    Method of administration may be changed at the discretion of home infusion pharmacist based upon assessment of the patient and/or caregiver's ability to self-administer the medication ordered   Complete by: As directed      Allergies as of 01/13/2020      Reactions   Carbamazepine Other (See Comments)   Causes aplastic anemia Other reaction(s): Other (See Comments) Causes aplastic anemia "TEGRETOL"    Zolpidem Tartrate Rash   "AMBIEN"        Medication List    STOP taking these medications   methocarbamol 500 MG tablet Commonly known as: ROBAXIN   oxyCODONE-acetaminophen 5-325 MG tablet Commonly known as: PERCOCET/ROXICET     TAKE these medications   acetaminophen 325 MG tablet Commonly known as: TYLENOL Take 650 mg by mouth every 6 (six) hours as needed for mild pain.   ceFEPime  IVPB Commonly known as: MAXIPIME Inject 2 g into the vein every 8 (eight) hours for 27 days. Indication:  Myositis First Dose: Yes Last Day of Therapy:  02/05/20 Labs - Once weekly:  CBC/D and BMP, Labs - Every other week:  ESR and CRP Method of administration: IV Push Method of administration may be changed at the discretion of home infusion pharmacist based upon assessment of the patient and/or caregiver's ability to self-administer the medication ordered.   cyclobenzaprine 5 MG tablet Commonly known as: FLEXERIL Take 1 tablet (5 mg total) by mouth 2 (two) times daily as needed for up to 7 days for muscle spasms.   docusate sodium 100 MG capsule Commonly known as: COLACE Take 1 capsule (100 mg total) by mouth daily.   Farxiga 5 MG Tabs tablet Generic drug: dapagliflozin propanediol Take 5 mg by mouth daily.   ferrous sulfate 325 (65 FE) MG tablet Take 1 tablet (325 mg total) by mouth 2 (two) times daily with a meal.   fluconazole 150 MG tablet Commonly known as: Diflucan Take 1 tablet (150 mg total) by mouth every 3 (three) days as needed for up to 3 doses.   metFORMIN 500 MG tablet Commonly known as: GLUCOPHAGE Take 500 mg by mouth daily.   oxyCODONE 5 MG immediate release tablet Commonly known as: Oxy IR/ROXICODONE Take 1 tablet (5 mg total) by mouth every 6 (six) hours as needed for up to 7 days for moderate pain or breakthrough pain.   pantoprazole 40 MG tablet Commonly known as: PROTONIX Take 1 tablet (40 mg total) by mouth 2 (two) times daily before a meal.   predniSONE 10 MG tablet Commonly known as:  DELTASONE Take 5 tablets (50 mg total) by mouth daily with breakfast for 5 days, THEN 4 tablets (40 mg total) daily with breakfast for 5 days, THEN 3 tablets (30 mg total) daily with breakfast for 5 days, THEN 2 tablets (20 mg total) daily with breakfast for 10 days. Start taking on: January 13, 2020   pregabalin 100 MG capsule Commonly known as: LYRICA Take 100 mg by mouth 3 (three) times daily.   sulfamethoxazole-trimethoprim 800-160 MG tablet Commonly known as: BACTRIM DS Take 1 tablet by mouth 3 (three) times a week for 21 days. Start taking on: January 15, 2020            Discharge Care Instructions  (  From admission, onward)         Start     Ordered   01/13/20 0000  Change dressing on IV access line weekly and PRN  (Home infusion instructions - [facility] )        01/13/20 1118   01/13/20 0000  Discharge wound care:       Comments: Continue wound vac   01/13/20 1042         Allergies  Allergen Reactions  . Carbamazepine Other (See Comments)    Causes aplastic anemia Other reaction(s): Other (See Comments) Causes aplastic anemia "TEGRETOL"   . Zolpidem Tartrate Rash    "AMBIEN"     Follow-up Information    [facility] Follow up.   Why: [phone] Your home IV antibiotic provider.        [facility] Follow up.   Specialty: Home Health Services Why: Home health RN services wil be provided by [place] information: [address] [place] [address] [phone]        Wallace Going, DO. Schedule an appointment as soon as possible for a visit in 1 week(s).   Specialty: Plastic Surgery Contact information: [address] [place] [address] [phone]        Mignon Pine, DO. Schedule an appointment as soon as possible for a visit in 1 week(s).   Specialties: Internal Medicine, Infectious Diseases Contact information: [address] [place]  [address] [phone]                The results of significant diagnostics from this hospitalization (including imaging, microbiology, ancillary and laboratory) are listed below for reference.    Significant Diagnostic Studies: DG Tibia/Fibula Left  Result Date: 01/04/2020 CLINICAL DATA:  Left leg wound, increased drainage, foul odor EXAM: LEFT TIBIA AND FIBULA - 2 VIEW COMPARISON:  01/16/2017 FINDINGS: The soft tissue defect at the level of the distal diaphysis of the tibia and fibula has markedly increased in size in keeping with interval surgical debridement. There is extensive subcutaneous edema within the left lower extremity more proximal to the area of debris mint. There is developing exuberant periosteal reaction involving the anterior cortex of the distal tibia and medial cortex of the a fibula in the region of debridement suspicious for chronic osteomyelitis or, less likely, reactive hyperostosis. No associated fracture or dislocation. IMPRESSION: Interval debridement with enlarging surgical defect. Developing exuberant periosteal reaction suspicious for changes of chronic osteomyelitis involving the distal tibia and fibula diaphyses. This could be better assessed with contrast enhanced MRI examination. Electronically Signed   By: Fidela Salisbury MD   On: 01/04/2020 20:28   MR TIBIA FIBULA LEFT W WO CONTRAST  Result Date: 01/05/2020 CLINICAL DATA:  Diabetic with chronic leg wound. History of pyoderma gangrenosum. Open wound lower extremity. EXAM: MRI OF LOWER LEFT EXTREMITY WITHOUT AND WITH CONTRAST TECHNIQUE: Multiplanar, multisequence MR imaging of the left lower extremity was performed both before and after administration of intravenous contrast. CONTRAST:  58m GADAVIST GADOBUTROL 1 MMOL/ML IV SOLN COMPARISON:  01/16/2017 FINDINGS: Extensive soft tissue defect involving almost entire circumference of the left lower extremity distally. Some surrounding enhancing granulation tissue and  subcutaneous soft tissue swelling/edema suggesting cellulitis. No findings for drainable soft soft tissue abscess. Underlying myositis mainly involving the anterior tibialis and peroneal muscles with inflammation/edema and enhancement but no findings to suggest pyomyositis. Distal cortical thickening of the tibia likely chronic periosteal  reaction but no MR findings to suggest osteomyelitis. No evidence of septic arthritis at the ankle or knee joints. IMPRESSION: 1. Chronic extensive soft tissue defect involving almost entire circumference of the left lower extremity distally. 2. Mild diffuse cellulitis but no soft tissue abscess. 3. Myositis mainly involving the anterior tibialis and peroneal muscles but no findings for pyomyositis. 4. No findings for septic arthritis or osteomyelitis. Electronically Signed   By: Marijo Sanes M.D.   On: 01/05/2020 09:10   [place] EKG SITE RITE  Result Date: 01/08/2020 If Site Rite image not attached, placement could not be confirmed due to current cardiac rhythm.   Microbiology: Recent Results (from the past 240 hour(s))  Culture, blood (Routine x 2)     Status: None   Collection Time: 01/04/20  5:47 PM   Specimen: BLOOD  Result Value Ref Range Status   Specimen Description BLOOD SITE NOT SPECIFIED  Final   Special Requests   Final    BOTTLES DRAWN AEROBIC AND ANAEROBIC Blood Culture adequate volume   Culture   Final    NO GROWTH 5 DAYS Performed at [facility], [address] [place], [place] [address]    Report Status 01/09/2020 FINAL  Final  Culture, blood (Routine x 2)     Status: None   Collection Time: 01/04/20  8:52 PM   Specimen: BLOOD  Result Value Ref Range Status   Specimen Description BLOOD LEFT ANTECUBITAL  Final   Special Requests   Final    BOTTLES DRAWN AEROBIC AND ANAEROBIC Blood Culture adequate volume   Culture   Final    NO GROWTH 5 DAYS Performed at [facility], [place] [address], [place], [place] [address]    Report  Status 01/09/2020 FINAL  Final  SARS CORONAVIRUS 2 (TAT 6-24 HRS) Nasopharyngeal Nasopharyngeal Swab     Status: None   Collection Time: 01/04/20  9:56 PM   Specimen: Nasopharyngeal Swab  Result Value Ref Range Status   SARS Coronavirus 2 NEGATIVE NEGATIVE Final    Comment: (NOTE) SARS-CoV-2 target nucleic acids are NOT DETECTED.  The SARS-CoV-2 RNA is generally detectable in upper and lower respiratory specimens during the acute phase of infection. Negative results do not preclude SARS-CoV-2 infection, do not rule out co-infections with other pathogens, and should not be used as the sole basis for treatment or other patient management decisions. Negative results must be combined with clinical observations, patient history, and epidemiological information. The expected result is Negative.  Fact Sheet for Patients: SugarRoll.be  Fact Sheet for Healthcare Providers: https://www.woods-mathews.com/  This test is not yet approved or cleared by the [place] FDA and  has been authorized for detection and/or diagnosis of SARS-CoV-2 by FDA under an Emergency Use Authorization (EUA). This EUA will remain  in effect (meaning this test can be used) for the duration of the COVID-19 declaration under Se ction 564(b)(1) of the Act, 21 U.S.C. section 360bbb-3(b)(1), unless the authorization is terminated or revoked sooner.  Performed at [facility], [place] [address] [place], [place] [address]   Aerobic/Anaerobic Culture (surgical/deep wound)     Status: None   Collection Time: 01/05/20 10:19 PM   Specimen: Wound  Result Value Ref Range Status   Specimen Description WOUND LEFT LEG  Final   Special Requests NONE  Final   Gram Stain   Final    FEW WBC PRESENT, PREDOMINANTLY PMN RARE GRAM NEGATIVE RODS RARE GRAM POSITIVE COCCI IN CHAINS  Culture   Final    FEW PROVIDENCIA RETTGERI FEW PSEUDOMONAS AERUGINOSA NO ANAEROBES  ISOLATED Performed at [facility], [place] [address], [place], [place] [address]    Report Status 01/10/2020 FINAL  Final   Organism ID, Bacteria PROVIDENCIA RETTGERI  Final   Organism ID, Bacteria PSEUDOMONAS AERUGINOSA  Final      Susceptibility   Pseudomonas aeruginosa - MIC*    CEFTAZIDIME <=1 SENSITIVE Sensitive     CIPROFLOXACIN >=4 RESISTANT Resistant     GENTAMICIN <=1 SENSITIVE Sensitive     IMIPENEM 1 SENSITIVE Sensitive     CEFEPIME 1 SENSITIVE Sensitive     * FEW PSEUDOMONAS AERUGINOSA   Providencia rettgeri - MIC*    AMPICILLIN >=32 RESISTANT Resistant     CEFAZOLIN >=64 RESISTANT Resistant     CEFEPIME 1 SENSITIVE Sensitive     CEFTAZIDIME <=1 SENSITIVE Sensitive     CIPROFLOXACIN INTERMEDIATE Intermediate     GENTAMICIN <=1 SENSITIVE Sensitive     IMIPENEM 1 SENSITIVE Sensitive     TRIMETH/SULFA <=20 SENSITIVE Sensitive     AMPICILLIN/SULBACTAM <=2 SENSITIVE Sensitive     PIP/TAZO <=4 SENSITIVE Sensitive     * FEW PROVIDENCIA RETTGERI  Aerobic/Anaerobic Culture (surgical/deep wound)     Status: None (Preliminary result)   Collection Time: 01/08/20  1:07 PM   Specimen: Leg, Left; Wound  Result Value Ref Range Status   Specimen Description WOUND  Final   Special Requests LEFT LOWER LEG SPEC A  Final   Gram Stain   Final    NO WBC SEEN NO ORGANISMS SEEN Performed at [facility], [address] [place], [place] [address]    Culture   Final    RARE NORMAL SKIN FLORA NO STAPHYLOCOCCUS AUREUS ISOLATED NO GROUP A STREP (S.PYOGENES) ISOLATED NO ANAEROBES ISOLATED; CULTURE IN PROGRESS FOR 5 DAYS    Report Status PENDING  Incomplete     Labs: Basic Metabolic Panel: Recent Labs  Lab 01/08/20 0158 01/09/20 0410 01/10/20 0416 01/11/20 0332 01/12/20 0332  NA 138 135 137 137 138  K 4.2 3.8 3.7 3.7 4.0  CL 106 104 106 105 105  CO2 19* 20* 21* 21* 21*  GLUCOSE 116* 188* 130* 120* 136*  BUN _0 22*  CREATININE 0.79 0.79 0.80 0.71 0.71   CALCIUM 9.2 8.9 9.1 9.1 9.3   Liver Function Tests: No results for input(s): AST, ALT, ALKPHOS, BILITOT, PROT, ALBUMIN in the last 168 hours. No results for input(s): LIPASE, AMYLASE in the last 168 hours. No results for input(s): AMMONIA in the last 168 hours. CBC: Recent Labs  Lab 01/08/20 0158 01/09/20 0410 01/10/20 0416 01/11/20 0332 01/12/20 0332  WBC 8.1 10.8* 11.9* 12.0* 13.0*  NEUTROABS  --  8.0* 7.5 7.7 8.8*  HGB 9.5* 9.3* 9.5* 9.7* 9.7*  HCT 31.5* 31.5* 32.6* 33.0* 31.6*  MCV 74.8* 76.3* 77.3* 76.9* 76.1*  PLT 408* 400 436* 414* 413*   Cardiac Enzymes: No results for input(s): CKTOTAL, CKMB, CKMBINDEX, TROPONINI in the last 168 hours. BNP: BNP (last 3 results) No results for input(s): BNP in the last 8760 hours.  ProBNP (last 3 results) No results for input(s): PROBNP in the last 8760 hours.  CBG: Recent Labs  Lab 01/12/20 0659 01/12/20 1132 01/12/20 1651 01/12/20 2013 01/13/20 0640  GLUCAP 120* 179* 177* 185* 119*       Signed:  Alma Friendly, MD [facility] 01/13/2020, 11:33 AM

## 2020-01-13 NOTE — TOC Transition Note (Signed)
Transition of Care [facility]) - CM/SW Discharge Note   Patient Details  Name: [name] MRN: [id number] Date of Birth: Jun 30, 1985  [facility]) CM/SW Contact:  Sharin Mons, RN Phone Number: 01/13/2020, 12:41 PM   Clinical Narrative:    Patient will DC to: home with famiy Anticipated DC date: 01/23/2019 Family notified: yes, sister Transport by: car      -Chronic osteomyelitis LL leg - Per MD patient ready for DC today. Pt to received afternoon dose of IV ABX while in hospital with 10 pm dose given @ home once d/c, [facility] and [facility] health aware of d/c plan.  Pt without Rx med concerns or affordability issues. Post hospital f/u appointments noted on AVS.  RNCM will sign off for now as intervention is no longer needed. Please consult [place] again if new needs arise.    Final next level of care: Home w Home Health Services Barriers to Discharge: No Barriers Identified   Patient Goals and CMS Choice   CMS Medicare.gov Compare Post Acute Care list provided to:: Patient Choice offered to / list presented to : Patient  Discharge Placement                       Discharge Plan and Services   Discharge Planning Services: CM Consult Post Acute Care Choice: Home Health          DME Arranged: Other see comment ([facility], IV ABX therapy)   Date DME Agency Contacted: 01/13/20 Time DME Agency Contacted: [phone] Representative spoke with at DME Agency: [place]: RN [place] Agency: [place] Date [place]: 01/13/20 Time [place]: [phone] Representative spoke with at [place]: [place] Determinants of Health ([place]) Interventions     Readmission Risk Interventions No flowsheet data found.

## 2020-01-13 NOTE — TOC Progression Note (Addendum)
Transition of Care [facility]) - Progression Note    Patient Details  Name: [name] MRN: [id number] Date of Birth: 15-Apr-1985  Transition of Care [facility]) CM/SW Contact  Sharin Mons, RN Phone Number: 01/13/2020, 9:55 AM  Clinical Narrative:    NCM f/u with Luc[facility] Shutters regarding home health services. NCM spoke with Tommi Rumps. Tommi Rumps made NCM aware and have accepted pt for home health RN services, NCM to make pt aware.  Per [facility] liaison Pam, home infusion teaching completed.   NCM will continue to [facility] needs.... hoping to d/c pt to home today.   Expected Discharge Plan: [place] Barriers to Discharge: Continued Medical Work up  Expected Discharge Plan and Services Expected Discharge Plan: [place]   Discharge Planning Services: CM Consult Post Acute Care Choice: Home Health                             HH Arranged: RN [facility] Agency: [place] Date [place]: 01/13/20 Time [place]: [phone] Representative spoke with at [place]: [place]) Interventions    Readmission Risk Interventions No flowsheet data found.

## 2020-01-13 NOTE — Plan of Care (Signed)

## 2020-01-15 ENCOUNTER — Other Ambulatory Visit: Payer: Self-pay

## 2020-01-15 ENCOUNTER — Telehealth (INDEPENDENT_AMBULATORY_CARE_PROVIDER_SITE_OTHER): Payer: Medicaid Other | Admitting: Infectious Diseases

## 2020-01-15 DIAGNOSIS — L88 Pyoderma gangrenosum: Secondary | ICD-10-CM

## 2020-01-15 DIAGNOSIS — T148XXA Other injury of unspecified body region, initial encounter: Secondary | ICD-10-CM | POA: Diagnosis not present

## 2020-01-15 DIAGNOSIS — L089 Local infection of the skin and subcutaneous tissue, unspecified: Secondary | ICD-10-CM | POA: Diagnosis not present

## 2020-01-15 NOTE — Assessment & Plan Note (Addendum)
Hyperglycemia on steroids as she expected. Working with dermatology going forward.

## 2020-01-15 NOTE — Progress Notes (Signed)
Patient: [name]  DOB: 01/24/1985 MRN: [id number] PCP: Jeanie Sewer, NP   VIRTUAL CARE ENCOUNTER  I connected with [name] on 03/10/20 at  3:45 PM EST by telephone and verified that I am speaking with the correct person using two identifiers.   I discussed the limitations, risks, security and privacy concerns of performing an evaluation and management service by telephone and the availability of in person appointments. I also discussed with the patient that there may be a patient responsible charge related to this service. The patient expressed understanding and agreed to proceed.  Patient Location: [facility]   Other Participants:   Provider Location: [facility]    Patient Active Problem List   Diagnosis Date Noted  . Dysplastic nevus 01/17/2020  . Pyoderma gangrenosa   . Cellulitis of left lower extremity   . Chronic osteomyelitis involving lower leg, left ([place]) 01/04/2020  . Wound infection 01/04/2020  . Diabetes mellitus type 2 in obese ([place]) 01/04/2020  . Obesity, Class III, BMI 40-49.9 (morbid obesity) ([place]) 01/04/2020  . Chronic osteomyelitis of left lower leg ([place]) 01/04/2020  . Diabetes mellitus ([place]) 01/17/2017  . Leg wound, left, initial encounter 01/17/2017  . History of endometriosis 01/17/2017  . Microcytic anemia 01/17/2017  . Cigarette smoker 01/17/2017  . History of bipolar disorder 01/17/2017  . Recurrent boils 01/17/2017  . IVDU (intravenous drug user) 01/17/2017  . Morbid obesity ([place]) 01/17/2017     Subjective:   [name] is a 35 y.o. female with hx of diabetes, pyoderma gangrenosa who required admission for superinfection of chronic wound on LLE. She was previously on Humira to control PG, however was unable to continue due to cost/insurance changes. She was admitted in January with concerns over infection of the chronic open wound. Dr. Marla Roe with plastics took her to surgery and debrided the space. Superficial  wound cultures grew out Pseudomonas and Providencia rettgeri. Intraoperative cultures were still pending at discharge and she is here today virtually for follow up on these culutres to ensure no tx mismatch.   She still has a wound vac to the area as directed by plastics team. Painful for her but manageable. She has ben started on prednisone 60 mg daily - has not had plans to start PG treatment with Humaira yet, her referral has ran out for GI and rheumatology referral and will d/w her primary care team Monday.   This morning she feels much better. She is outside for the first time since coming home. Taking some tylenol for pain medication but nothing stronger than that. No problems with the IV antibiotics. PICC line is without pain, drainage or erythema and is well maintained by [facility] Team. No swelling or altered sensation in affected distal extremity.    Review of Systems  All other systems reviewed and are negative.   Past Medical History:  Diagnosis Date  . Anemia   . Anxiety   . Depression   . Diabetes mellitus without complication ([place])    type 2  . GERD (gastroesophageal reflux disease)   . Neuromuscular disorder ([place])    left leg neuropathy  . Pyoderma gangrenosa     Outpatient Medications Prior to Visit  Medication Sig Dispense Refill  . ceFEPime (MAXIPIME) IVPB Inject 2 g into the vein every 8 (eight) hours for 27 days. Indication:  Myositis First Dose: Yes Last Day of Therapy:  02/05/20 Labs - Once weekly:  CBC/D and BMP, Labs - Every other week:  ESR and CRP Method of administration: IV Push Method of administration may be changed at the discretion of home infusion pharmacist based upon assessment of the patient and/or caregiver's ability to self-administer the medication ordered. 81 Units 0  . cyclobenzaprine (FLEXERIL) 5 MG tablet Take 1 tablet (5 mg total) by mouth 2 (two) times daily as needed for up to 7 days for muscle spasms. 14 tablet 0  . FARXIGA 5 MG TABS  tablet Take 5 mg by mouth daily.    . ferrous sulfate 325 (65 FE) MG tablet Take 1 tablet (325 mg total) by mouth 2 (two) times daily with a meal. (Patient not taking: Reported on 02/19/2020) 60 tablet 0  . oxyCODONE (OXY IR/ROXICODONE) 5 MG immediate release tablet Take 1 tablet (5 mg total) by mouth every 6 (six) hours as needed for up to 7 days for moderate pain or breakthrough pain. 28 tablet 0  . pantoprazole (PROTONIX) 40 MG tablet Take 1 tablet (40 mg total) by mouth 2 (two) times daily before a meal. (Patient taking differently: Take 40 mg by mouth daily as needed (reflux).) 60 tablet 0  . predniSONE (DELTASONE) 10 MG tablet Take 5 tablets (50 mg total) by mouth daily with breakfast for 5 days, THEN 4 tablets (40 mg total) daily with breakfast for 5 days, THEN 3 tablets (30 mg total) daily with breakfast for 5 days, THEN 2 tablets (20 mg total) daily with breakfast for 10 days. 80 tablet 0  . pregabalin (LYRICA) 100 MG capsule Take 100 mg by mouth 3 (three) times daily.    Mar[place] Kitchen sulfamethoxazole-trimethoprim (BACTRIM DS) 800-160 MG tablet Take 1 tablet by mouth 3 (three) times a week for 21 days. 9 tablet 0  . acetaminophen (TYLENOL) 325 MG tablet Take 650 mg by mouth every 6 (six) hours as needed for mild pain.    Mar[place] Kitchen docusate sodium (COLACE) 100 MG capsule Take 1 capsule (100 mg total) by mouth daily. (Patient not taking: Reported on 02/19/2020) 10 capsule 0  . fluconazole (DIFLUCAN) 150 MG tablet Take 1 tablet (150 mg total) by mouth every 3 (three) days as needed for up to 3 doses. 3 tablet 0  . metFORMIN (GLUCOPHAGE) 500 MG tablet Take 500 mg by mouth daily.     No facility-administered medications prior to visit.     Allergies  Allergen Reactions  . Carbamazepine Other (See Comments)    Causes aplastic anemia Other reaction(s): Other (See Comments) Causes aplastic anemia "TEGRETOL"   . Zolpidem Tartrate Rash    "AMBIEN"     Social History   Tobacco Use  . Smoking status: Former  Smoker    Packs/day: 0.50    Years: 20.00    Pack years: 10.00    Types: Cigarettes    Quit date: 01/04/2020    Years since quitting: 0.1  . Smokeless tobacco: Never Used  Vaping Use  . Vaping Use: Never used  Substance Use Topics  . Alcohol use: No  . Drug use: No    No family history on file.  Objective:  There were no vitals filed for this visit. There is no height or weight on file to calculate BMI.  Physical Exam Pulmonary:     Effort: Pulmonary effort is normal.     Comments: No shortness of breath detected in conversation.  Neurological:     Mental Status: She is oriented to person, place, and time.  Psychiatric:        Mood and Affect: Mood  normal.        Behavior: Behavior normal.        Thought Content: Thought content normal.        Judgment: Judgment normal.     Lab Results: Lab Results  Component Value Date   WBC 7.2 02/27/2020   HGB 11.4 (L) 02/27/2020   HCT 37.5 02/27/2020   MCV 80.0 02/27/2020   PLT 308 02/27/2020    Lab Results  Component Value Date   CREATININE 0.72 02/27/2020   BUN 12 02/27/2020   NA 137 02/27/2020   K 3.9 02/27/2020   CL 107 02/27/2020   CO2 18 (L) 02/27/2020    Lab Results  Component Value Date   ALT 41 01/04/2020   AST 24 01/04/2020   ALKPHOS 133 (H) 01/04/2020   BILITOT 0.5 01/04/2020     Assessment & Plan:   Problem List Items Addressed This Visit      Unprioritized   Wound infection    FU as needed with ID Close follow up with dermatology and plastics Continue bactrim prophylaxis until off high-dose prednisone  Continue IV therapy as outlined through 02/05/20      Pyoderma gangrenosa    Hyperglycemia on steroids as she expected. Working with dermatology going forward.          Follow Up Instructions: FU as needed with ID Close follow up with dermatology and plastics Continue bactrim prophylaxis until off high-dose prednisone  Continue IV therapy as outlined through 02/05/20   I discussed the  assessment and treatment plan with the patient. The patient was provided an opportunity to ask questions and all were answered. The patient agreed with the plan and demonstrated an understanding of the instructions.   The patient was advised to call back or seek an in-person evaluation if the symptoms worsen or if the condition fails to improve as anticipated.  I provided 10 minutes of non-face-to-face time during this encounter.   Janene Madeira, MSN, NP-C [facility] for Infectious Disease [email or web address] Pager: [phone] Office: [phone] [facility] Main Line: [phone]    03/10/20  8:25 AM

## 2020-01-17 ENCOUNTER — Other Ambulatory Visit: Payer: Self-pay

## 2020-01-17 ENCOUNTER — Encounter: Payer: Self-pay | Admitting: Plastic Surgery

## 2020-01-17 ENCOUNTER — Ambulatory Visit (INDEPENDENT_AMBULATORY_CARE_PROVIDER_SITE_OTHER): Payer: Medicaid Other | Admitting: Plastic Surgery

## 2020-01-17 DIAGNOSIS — D239 Other benign neoplasm of skin, unspecified: Secondary | ICD-10-CM

## 2020-01-17 NOTE — Progress Notes (Addendum)
   Subjective:    Patient ID: [name], female    DOB: 11-22-1985, 35 y.o.   MRN: [id number]  The patient is a 35 year old female here for follow-up on her left leg wound.  She had been treated for pyoderma gangrenosum for a number of years.  The pathology does not look like that is what it is at this point in time.  It does look like a panniculitis.  The [facility] has been on for the past week.  I removed it today.  The sore VAC is still in place.  The periwound swelling is markedly better.  The patient also states her pain level is better as well.  She is not able to get home health to help with the VAC changes at home.     Review of Systems  Constitutional: Negative.   HENT: Negative.   Eyes: Negative.   Respiratory: Negative.   Genitourinary: Negative.   Musculoskeletal: Negative.        Objective:   Physical Exam Vitals and nursing note reviewed.  Constitutional:      Appearance: Normal appearance.  Cardiovascular:     Rate and Rhythm: Normal rate.     Pulses: Normal pulses.  Musculoskeletal:       Legs:  Neurological:     General: No focal deficit present.     Mental Status: She is alert and oriented to person, place, and time.  Psychiatric:        Mood and Affect: Mood normal.        Assessment & Plan:     ICD-10-CM   1. Dysplastic nevus  D23.9      Plan for 1 week follow-up.  The VAC was changed today.  Continue elevation.

## 2020-01-17 NOTE — Addendum Note (Signed)
Addended by: Wallace Going on: 01/17/2020 11:57 AM   Modules accepted: Level of Service

## 2020-01-24 ENCOUNTER — Telehealth: Payer: Self-pay | Admitting: *Deleted

## 2020-01-24 ENCOUNTER — Ambulatory Visit: Payer: Medicaid Other | Admitting: Plastic Surgery

## 2020-01-24 NOTE — Telephone Encounter (Signed)
Received orders from [facility] on (01/23/20) via of fax.  Requesting orders to be signed.  Given to provider to sign.  Orders signed and faxed back to [facility].  Confirmation received and copy scanned into the chart.//AB/CMA

## 2020-01-27 NOTE — Progress Notes (Signed)
Patient is a 35 year old female here for follow-up after undergoing excision of left leg wound (24 x 16 x 3 cm) with placement of Stravix (medial lower leg wound) and placement of ACell powder and sheet on the remainder of the wound on 01/08/2020 with Dr. Marla Roe. She had been treated for pyoderma gangrenosum for a number of years, however the pathology does not look like that at this time. Wound VAC was changed at her last visit.  ~ 3 weeks PO Patient reports she is doing very well.  Reports that the pain in her left leg has decreased.  She is currently taking 30 mg of prednisone daily.  Reports she has quit smoking.  Denies fever/chills, nausea/vomiting, CP, or SOB.  Wound shows a significant improvement with good tissue formation.  Wound today measures approximately 24 x 15 x 1 cm.  No signs of infection.  Erythema of surrounding tissue has decreased significantly and shows good color today.  Wound VAC was changed today.  Follow-up in 1 week for wound VAC change.  Call office with any questions/concerns.  Return precautions provided.      Pictures were obtained of the patient and placed in the chart with the patient's or guardian's permission.

## 2020-01-28 ENCOUNTER — Ambulatory Visit (INDEPENDENT_AMBULATORY_CARE_PROVIDER_SITE_OTHER): Payer: Medicaid Other | Admitting: Plastic Surgery

## 2020-01-28 ENCOUNTER — Encounter: Payer: Self-pay | Admitting: Plastic Surgery

## 2020-01-28 ENCOUNTER — Other Ambulatory Visit: Payer: Self-pay

## 2020-01-28 VITALS — BP 142/89 | HR 91 | Temp 98.2°F | Ht 68.0 in | Wt 264.0 lb

## 2020-01-28 DIAGNOSIS — Z4689 Encounter for fitting and adjustment of other specified devices: Secondary | ICD-10-CM | POA: Diagnosis not present

## 2020-01-28 DIAGNOSIS — S81802D Unspecified open wound, left lower leg, subsequent encounter: Secondary | ICD-10-CM

## 2020-02-04 ENCOUNTER — Encounter: Payer: Self-pay | Admitting: Plastic Surgery

## 2020-02-04 ENCOUNTER — Ambulatory Visit (INDEPENDENT_AMBULATORY_CARE_PROVIDER_SITE_OTHER): Payer: Medicaid Other | Admitting: Plastic Surgery

## 2020-02-04 ENCOUNTER — Other Ambulatory Visit: Payer: Self-pay

## 2020-02-04 VITALS — BP 120/73 | HR 73 | Temp 97.5°F | Ht 68.0 in | Wt 264.0 lb

## 2020-02-04 DIAGNOSIS — S81802D Unspecified open wound, left lower leg, subsequent encounter: Secondary | ICD-10-CM | POA: Diagnosis not present

## 2020-02-04 NOTE — Progress Notes (Signed)
   Subjective:    Patient ID: [name], female    DOB: 1985/12/06, 35 y.o.   MRN: [id number]  The patient is a 35 year old female here for follow-up on her left leg wound.  She has been using the VAC.  We have been doing the dressing changes here in the office.  The periwound area is markedly improved.  The swelling has decreased.  She has a few days left of the antibiotics through the PICC line.  She is pleased with her progress.  I removed in the [facility] and the product is incorporating very nicely.  There is no malodor or sign of infection.     Review of Systems  Constitutional: Negative.   HENT: Negative.   Eyes: Negative.   Respiratory: Negative.  Negative for chest tightness.   Cardiovascular: Negative for chest pain.  Gastrointestinal: Negative.   Musculoskeletal: Negative.   Skin: Positive for color change and wound.       Objective:   Physical Exam Vitals and nursing note reviewed.  Constitutional:      Appearance: Normal appearance.  HENT:     Head: Normocephalic and atraumatic.  Cardiovascular:     Rate and Rhythm: Normal rate.     Pulses: Normal pulses.  Pulmonary:     Effort: Pulmonary effort is normal. No respiratory distress.  Neurological:     Mental Status: She is alert. Mental status is at baseline.  Psychiatric:        Mood and Affect: Mood normal.        Behavior: Behavior normal.        Thought Content: Thought content normal.         Assessment & Plan:     ICD-10-CM   1. Leg wound, left, subsequent encounter  S81.802D     Recommend continuing with the VAC once a week VAC changes.  She will likely need to return to the OR in 2 weeks.

## 2020-02-06 ENCOUNTER — Telehealth: Payer: Self-pay

## 2020-02-06 NOTE — Telephone Encounter (Signed)
Verbal orders given to RN to pull PICC today.   Alias Villagran Lorita Officer, RN

## 2020-02-06 NOTE — Telephone Encounter (Signed)
Yes, okay to pull picc.  Thanks!

## 2020-02-06 NOTE — Telephone Encounter (Signed)
Home Health RN called requesting pull PICC orders; abx ended 02/05/20 and patient does not have MD appointment until next week. OPAT orders are to pull after last dose but from early January. Forwarding to provider to confirm ok to pull.   Krzysztof Reichelt Lorita Officer, RN

## 2020-02-09 NOTE — Progress Notes (Signed)
ICD-10-CM   1. Leg wound, left, subsequent encounter  S81.802D       Patient ID: [name], female    DOB: 1985/12/22, 35 y.o.   MRN: [id number]  The patient gave consent to have this visit done by telemedicine / virtual visit.  This is also consent for access the chart and treat the patient via this visit. The patient is located at home.  I, the provider, am at the office.  We spent 30 minutes together for the visit.  Joined by telephone.  History of Present Illness: [name] is a 35 y.o.  female  with a history of left leg wound.  She presents for preoperative evaluation for upcoming procedure, left leg wound irrigation and debridement with placement of ACell and VAC, scheduled for 02/19/2020 with Dr. Marla Roe.  Summary from previous visit: Patient had left lower leg wound debrided previously in the OR with placement of ACell and Stravix and wound VAC.  She has been having wound VAC changes completed in the office.  The periwound area is markedly improved.  Swelling has decreased.  Product is incorporating very nicely with no signs of infection.  PMH Significant for: Diabetes, Current Suboxone treatment  The patient has not had problems with anesthesia.   Past Medical History: Allergies: Allergies  Allergen Reactions  . Carbamazepine Other (See Comments)    Causes aplastic anemia Other reaction(s): Other (See Comments) Causes aplastic anemia "TEGRETOL"   . Zolpidem Tartrate Rash    "AMBIEN"     Current Medications:  Current Outpatient Medications:  .  acetaminophen (TYLENOL) 325 MG tablet, Take 650 mg by mouth every 6 (six) hours as needed for mild pain., Disp: , Rfl:  .  ceFEPime (MAXIPIME) IVPB, Inject 2 g into the vein every 8 (eight) hours for 27 days. Indication:  Myositis First Dose: Yes Last Day of Therapy:  02/05/20 Labs - Once weekly:  CBC/D and BMP, Labs - Every other week:  ESR and CRP Method of administration: IV Push Method of administration may be  changed at the discretion of home infusion pharmacist based upon assessment of the patient and/or caregiver's ability to self-administer the medication ordered., Disp: 81 Units, Rfl: 0 .  docusate sodium (COLACE) 100 MG capsule, Take 1 capsule (100 mg total) by mouth daily., Disp: 10 capsule, Rfl: 0 .  FARXIGA 5 MG TABS tablet, Take 5 mg by mouth daily., Disp: , Rfl:  .  ferrous sulfate 325 (65 FE) MG tablet, Take 1 tablet (325 mg total) by mouth 2 (two) times daily with a meal., Disp: 60 tablet, Rfl: 0 .  fluconazole (DIFLUCAN) 150 MG tablet, Take 1 tablet (150 mg total) by mouth every 3 (three) days as needed for up to 3 doses., Disp: 3 tablet, Rfl: 0 .  pantoprazole (PROTONIX) 40 MG tablet, Take 1 tablet (40 mg total) by mouth 2 (two) times daily before a meal., Disp: 60 tablet, Rfl: 0 .  pregabalin (LYRICA) 100 MG capsule, Take 100 mg by mouth 3 (three) times daily., Disp: , Rfl:   Past Medical Problems: Past Medical History:  Diagnosis Date  . Diabetes mellitus without complication ([place])    with pregnancy  . Pyoderma gangrenosa     Past Surgical History: Past Surgical History:  Procedure Laterality Date  . APPLICATION OF A-CELL OF EXTREMITY Left 01/19/2017   Procedure: APPLICATION OF A-CELL OF EXTREMITY AND WOUND VAC;  Surgeon: Wallace Going, DO;  Location: [place];  Service: Plastics;  Laterality: Left;  . APPLICATION OF WOUND VAC Left 01/16/2017   Procedure: APPLICATION OF WOUND VAC;  Surgeon: Dorna Leitz, MD;  Location: [place];  Service: Orthopedics;  Laterality: Left;  . CESAREAN SECTION    . I & D EXTREMITY Left 01/16/2017   Procedure: DEBRIDEMENT OF LEFT ANTERIOR AND POSTERLATERAL TIBIAL WOUNDS;  Surgeon: Dorna Leitz, MD;  Location: [place];  Service: Orthopedics;  Laterality: Left;  . I & D EXTREMITY Left 01/19/2017   Procedure: IRRIGATION AND DEBRIDEMENT EXTREMITY;  Surgeon: Wallace Going, DO;  Location: [place];  Service: Plastics;  Laterality: Left;  . I & D EXTREMITY  Left 01/08/2020   Procedure: Excision of leg wound with Grafix placement;  Surgeon: Wallace Going, DO;  Location[place];  Service: Plastics;  Laterality: Left;  1 hour, please    Social History: Social History   Socioeconomic History  . Marital status: Single    Spouse name: Not on file  . Number of children: Not on file  . Years of education: Not on file  . Highest education level: Not on file  Occupational History  . Not on file  Tobacco Use  . Smoking status: Current Some Day Smoker    Packs/day: 0.50    Types: Cigarettes  . Smokeless tobacco: Never Used  Substance and Sexual Activity  . Alcohol use: No  . Drug use: No  . Sexual activity: Not on file  Other Topics Concern  . Not on file  Social History Narrative  . Not on file   Social Determinants of Health   Financial Resource Strain: Not on file  Food Insecurity: Not on file  Transportation Needs: Not on file  Physical Activity: Not on file  Stress: Not on file  Social Connections: Not on file  Intimate Partner Violence: Not on file    Family History: No family history on file.  Review of Systems: Review of Systems  Constitutional: Negative for chills and fever.  HENT: Negative for congestion and sore throat.   Respiratory: Negative for cough and shortness of breath.   Cardiovascular: Negative for chest pain and palpitations.  Gastrointestinal: Negative for abdominal pain, nausea and vomiting.  Skin: Negative for itching and rash.       Left lower leg wound    Physical Exam: Vital Signs LMP 02/02/2020 (Exact Date)  Physical Exam Neurological:     Mental Status: She is oriented to person, place, and time.  Psychiatric:        Mood and Affect: Mood normal.        Behavior: Behavior normal.        Thought Content: Thought content normal.        Judgment: Judgment normal.    Virtual Visit so physical exam was limited.  Assessment/Plan:  [name] scheduled for left leg wound irrigation and  debridement with placement of ACell and wound VAC with Dr. Marla Roe.  Risks, benefits, and alternatives of procedure discussed, questions answered and consent obtained.    Smoking Status: Former Smoker; Counseling Given? N/A  Caprini Score: Low; Risk Factors include: 35 year old female, BMI > 25, and length of planned surgery. Recommendation for mechanical or pharmacological prophylaxis during surgery. Encourage early ambulation.   Pictures obtained: 01/28/20  Post-op Rx sent to pharmacy: Keflex Chronic use of Suboxone and Pregabalin - Managed by Dr. Megan Salon, [facility].  Patient was provided with the General Surgical Risk consent document and Pain Medication Agreement prior to their  appointment.  They had adequate time to read through the risk consent documents and Pain Medication Agreement. We also discussed them in person together during this preop appointment. All of their questions were answered to their satisfaction.  Recommended calling if they have any further questions.  Risk consent form and Pain Medication Agreement to be scanned into patient's chart.  Electronically signed by: Threasa Heads, PA-C 02/09/2020 9:20 PM

## 2020-02-10 ENCOUNTER — Telehealth: Payer: Self-pay | Admitting: *Deleted

## 2020-02-10 ENCOUNTER — Ambulatory Visit: Payer: Medicaid Other | Admitting: Internal Medicine

## 2020-02-10 NOTE — Progress Notes (Deleted)
[facility] for Infectious Disease  CHIEF COMPLAINT:    Follow up for wound infection  SUBJECTIVE:    [name] is a 35 y.o. female with PMHx as below who presents to the clinic for follow up of wound infection.   She was hospitalized last month with concerns for superinfection of chronic lower extremity wound.  Dr Marla Roe with plastic surgery took her to the OR and debrided.  She completed 4 weeks of cefepime for this infection on 02/05/2020.  PICC line was pulled at end of therapy.  She saw plastics for follow up on 02/04/20.  Wound vac removed at that time and there was no malodor or sign of infection at that time.  She is going back to the OR on 02/19/2020 for further I&D.  Please see A&P for the details of today's visit and status of the patient's medical problems.   Patient's Medications  New Prescriptions   No medications on file  Previous Medications   DOCUSATE SODIUM (COLACE) 100 MG CAPSULE    Take 1 capsule (100 mg total) by mouth daily.   FARXIGA 5 MG TABS TABLET    Take 5 mg by mouth daily.   FERROUS SULFATE 325 (65 FE) MG TABLET    Take 1 tablet (325 mg total) by mouth 2 (two) times daily with a meal.   FLUCONAZOLE (DIFLUCAN) 150 MG TABLET    Take 1 tablet (150 mg total) by mouth every 3 (three) days as needed for up to 3 doses.   PANTOPRAZOLE (PROTONIX) 40 MG TABLET    Take 1 tablet (40 mg total) by mouth 2 (two) times daily before a meal.   PREGABALIN (LYRICA) 100 MG CAPSULE    Take 100 mg by mouth 3 (three) times daily.  Modified Medications   No medications on file  Discontinued Medications   No medications on file      Past Medical History:  Diagnosis Date  . Diabetes mellitus without complication ([place])    with pregnancy  . Pyoderma gangrenosa     Social History   Tobacco Use  . Smoking status: Current Some Day Smoker    Packs/day: 0.50    Types: Cigarettes  . Smokeless tobacco: Never Used  Substance Use Topics  . Alcohol use: No  .  Drug use: No    No family history on file.  Allergies  Allergen Reactions  . Carbamazepine Other (See Comments)    Causes aplastic anemia Other reaction(s): Other (See Comments) Causes aplastic anemia "TEGRETOL"   . Zolpidem Tartrate Rash    "AMBIEN"     ROS   OBJECTIVE:    There were no vitals filed for this visit. There is no height or weight on file to calculate BMI.  Physical Exam   Labs and Microbiology: CBC Latest Ref Rng & Units 01/12/2020 01/11/2020 01/10/2020  WBC 4.0 - 10.5 K/uL 13.0(H) 12.0(H) 11.9(H)  Hemoglobin 12.0 - 15.0 g/dL 9.7(L) 9.7(L) 9.5(L)  Hematocrit 36.0 - 46.0 % 31.6(L) 33.0(L) 32.6(L)  Platelets 150 - 400 K/uL 413(H) 414(H) 436(H)   CMP Latest Ref Rng & Units 01/12/2020 01/11/2020 01/10/2020  Glucose 70 - 99 mg/dL 136(H) 120(H) 130(H)  BUN 6 - 20 mg/dL 22(H) 15 13  Creatinine 0.44 - 1.00 mg/dL 0.71 0.71 0.80  Sodium 135 - 145 mmol/L 138 137 137  Potassium 3.5 - 5.1 mmol/L 4.0 3.7 3.7  Chloride 98 - 111 mmol/L 105 105 106  CO2 22 - 32  mmol/L 21(L) 21(L) 21(L)  Calcium 8.9 - 10.3 mg/dL 9.3 9.1 9.1  Total Protein 6.5 - 8.1 g/dL - - -  Total Bilirubin 0.3 - 1.2 mg/dL - - -  Alkaline Phos 38 - 126 U/L - - -  AST 15 - 41 U/L - - -  ALT 0 - 44 U/L - - -       ASSESSMENT & PLAN:    1. Cellulitis and Myositis Involving the left lower leg and status post I&D on 01/08/20.  Completed 4 weeks of IV antibiotics with no current evidence of ongoing infection.  2. Pyoderma gangrenosum Close follow up with dermatology and plastic surgery.  Currently on high dose steroids pending further treatment of her PG.  -- Follow up with ID as needed -- Needs close follow up with dermatology and plastic surgery -- Continue TMP-SMX PPx until off high dose prednisone     Raynelle Highland for Infectious Disease [facility] 02/10/2020, 1:12 PM

## 2020-02-10 NOTE — Telephone Encounter (Signed)
Received Physician Order on (02/06/20) via of fax from [facility] requesting signature and return.  Given to provider to sign.  Physician Orders signed and faxed back to [facility].  Confirmation received and copy scanned into the chart.//AB/CMA

## 2020-02-10 NOTE — Telephone Encounter (Signed)
Received orders on (02/08/20) via of fax from [facility].  Requesting signature for orders.  Given to provider to sign.    Orders signed and faxed back to [place].  Confirmation received and copy scanned into the chart.//AB/CMA

## 2020-02-10 NOTE — Telephone Encounter (Signed)
Received Episode Detail Report (02/08/20) via of fax from [place] for Review.  Given to provider to review.    Report reviewed and copy scanned into the patient's chart.//AB/CMA

## 2020-02-11 ENCOUNTER — Other Ambulatory Visit: Payer: Self-pay

## 2020-02-11 ENCOUNTER — Telehealth: Payer: Self-pay

## 2020-02-11 ENCOUNTER — Encounter: Payer: Self-pay | Admitting: Plastic Surgery

## 2020-02-11 ENCOUNTER — Ambulatory Visit (INDEPENDENT_AMBULATORY_CARE_PROVIDER_SITE_OTHER): Payer: Medicaid Other | Admitting: Plastic Surgery

## 2020-02-11 VITALS — BP 138/86 | HR 87 | Temp 97.3°F

## 2020-02-11 DIAGNOSIS — S81802A Unspecified open wound, left lower leg, initial encounter: Secondary | ICD-10-CM

## 2020-02-11 NOTE — Progress Notes (Signed)
Patient ID: [name], female    DOB: Sep 06, 1985, 35 y.o.   MRN: [id number]   Chief Complaint  Patient presents with  . Follow-up  . Skin Problem    The patient is a 35 year old female here for follow-up on her left leg wound.  She is also here for a history and physical.  We took her to the OR January 5 for debridement and placement of product.  She has been using the VAC at home since then.  We have been doing in office VAC changes weekly.  She is extremely pleased with her progress.  Her pain is minimal.  She is able to tolerate the dressing changes very well here in the office.  She still has some product in place.  It is incorporating and there is granulation tissue formation.  Not appear to be infected and there is no purulent drainage or malodor. The current size is 12 x 15 x 1 cm.   Review of Systems  Constitutional: Positive for activity change.  HENT: Negative.   Eyes: Negative.   Respiratory: Negative.   Cardiovascular: Negative.   Genitourinary: Negative.     Past Medical History:  Diagnosis Date  . Diabetes mellitus without complication ([place])    with pregnancy  . Pyoderma gangrenosa     Past Surgical History:  Procedure Laterality Date  . APPLICATION OF A-CELL OF EXTREMITY Left 01/19/2017   Procedure: APPLICATION OF A-CELL OF EXTREMITY AND WOUND VAC;  Surgeon: Wallace Going, DO;  Location: [place];  Service: Plastics;  Laterality: Left;  . APPLICATION OF WOUND VAC Left 01/16/2017   Procedure: APPLICATION OF WOUND VAC;  Surgeon: Dorna Leitz, MD;  Location: [place];  Service: Orthopedics;  Laterality: Left;  . CESAREAN SECTION    . I & D EXTREMITY Left 01/16/2017   Procedure: DEBRIDEMENT OF LEFT ANTERIOR AND POSTERLATERAL TIBIAL WOUNDS;  Surgeon: Dorna Leitz, MD;  Location: [place];  Service: Orthopedics;  Laterality: Left;  . I & D EXTREMITY Left 01/19/2017   Procedure: IRRIGATION AND DEBRIDEMENT EXTREMITY;  Surgeon: Wallace Going, DO;  Location: [place];   Service: Plastics;  Laterality: Left;  . I & D EXTREMITY Left 01/08/2020   Procedure: Excision of leg wound with Grafix placement;  Surgeon: Wallace Going, DO;  Location: [place];  Service: Plastics;  Laterality: Left;  1 hour, please      Current Outpatient Medications:  .  docusate sodium (COLACE) 100 MG capsule, Take 1 capsule (100 mg total) by mouth daily., Disp: 10 capsule, Rfl: 0 .  FARXIGA 5 MG TABS tablet, Take 5 mg by mouth daily., Disp: , Rfl:  .  ferrous sulfate 325 (65 FE) MG tablet, Take 1 tablet (325 mg total) by mouth 2 (two) times daily with a meal., Disp: 60 tablet, Rfl: 0 .  fluconazole (DIFLUCAN) 150 MG tablet, Take 1 tablet (150 mg total) by mouth every 3 (three) days as needed for up to 3 doses., Disp: 3 tablet, Rfl: 0 .  pantoprazole (PROTONIX) 40 MG tablet, Take 1 tablet (40 mg total) by mouth 2 (two) times daily before a meal., Disp: 60 tablet, Rfl: 0 .  pregabalin (LYRICA) 100 MG capsule, Take 100 mg by mouth 3 (three) times daily., Disp: , Rfl:    Objective:   There were no vitals filed for this visit.  Physical Exam Vitals and nursing note reviewed.  Constitutional:      Appearance: Normal appearance.  Cardiovascular:  Rate and Rhythm: Normal rate.     Pulses: Normal pulses.  Pulmonary:     Effort: Pulmonary effort is normal.  Neurological:     General: No focal deficit present.     Mental Status: She is alert.  Psychiatric:        Mood and Affect: Mood normal.        Behavior: Behavior normal.     Assessment & Plan:  Leg wound, left, initial encounter  Donated XCellistem was placed on the superior anterior aspect.  The Adaptic and VAC were applied.  Plan for OR for further debridement and placement of product next week. Pictures were obtained of the patient and placed in the chart with the patient's or guardian's permission.   [place], DO

## 2020-02-11 NOTE — Telephone Encounter (Signed)
Faxed hospital discharge notes to [facility] as requested to fax# [phone] Copy scanned into chart

## 2020-02-11 NOTE — H&P (View-Only) (Signed)
Patient ID: [name], female    DOB: 07/27/1985, 35 y.o.   MRN: [id number]   Chief Complaint  Patient presents with  . Follow-up  . Skin Problem    The patient is a 35 year old female here for follow-up on her left leg wound.  She is also here for a history and physical.  We took her to the OR January 5 for debridement and placement of product.  She has been using the VAC at home since then.  We have been doing in office VAC changes weekly.  She is extremely pleased with her progress.  Her pain is minimal.  She is able to tolerate the dressing changes very well here in the office.  She still has some product in place.  It is incorporating and there is granulation tissue formation.  Not appear to be infected and there is no purulent drainage or malodor. The current size is 12 x 15 x 1 cm.   Review of Systems  Constitutional: Positive for activity change.  HENT: Negative.   Eyes: Negative.   Respiratory: Negative.   Cardiovascular: Negative.   Genitourinary: Negative.     Past Medical History:  Diagnosis Date  . Diabetes mellitus without complication ([place])    with pregnancy  . Pyoderma gangrenosa     Past Surgical History:  Procedure Laterality Date  . APPLICATION OF A-CELL OF EXTREMITY Left 01/19/2017   Procedure: APPLICATION OF A-CELL OF EXTREMITY AND WOUND VAC;  Surgeon: Wallace Going, DO;  Location: [place];  Service: Plastics;  Laterality: Left;  . APPLICATION OF WOUND VAC Left 01/16/2017   Procedure: APPLICATION OF WOUND VAC;  Surgeon: Dorna Leitz, MD;  Location: [place];  Service: Orthopedics;  Laterality: Left;  . CESAREAN SECTION    . I & D EXTREMITY Left 01/16/2017   Procedure: DEBRIDEMENT OF LEFT ANTERIOR AND POSTERLATERAL TIBIAL WOUNDS;  Surgeon: Dorna Leitz, MD;  Location: [place];  Service: Orthopedics;  Laterality: Left;  . I & D EXTREMITY Left 01/19/2017   Procedure: IRRIGATION AND DEBRIDEMENT EXTREMITY;  Surgeon: Wallace Going, DO;  Location: [place];   Service: Plastics;  Laterality: Left;  . I & D EXTREMITY Left 01/08/2020   Procedure: Excision of leg wound with Grafix placement;  Surgeon: Wallace Going, DO;  Location: [place];  Service: Plastics;  Laterality: Left;  1 hour, please      Current Outpatient Medications:  .  docusate sodium (COLACE) 100 MG capsule, Take 1 capsule (100 mg total) by mouth daily., Disp: 10 capsule, Rfl: 0 .  FARXIGA 5 MG TABS tablet, Take 5 mg by mouth daily., Disp: , Rfl:  .  ferrous sulfate 325 (65 FE) MG tablet, Take 1 tablet (325 mg total) by mouth 2 (two) times daily with a meal., Disp: 60 tablet, Rfl: 0 .  fluconazole (DIFLUCAN) 150 MG tablet, Take 1 tablet (150 mg total) by mouth every 3 (three) days as needed for up to 3 doses., Disp: 3 tablet, Rfl: 0 .  pantoprazole (PROTONIX) 40 MG tablet, Take 1 tablet (40 mg total) by mouth 2 (two) times daily before a meal., Disp: 60 tablet, Rfl: 0 .  pregabalin (LYRICA) 100 MG capsule, Take 100 mg by mouth 3 (three) times daily., Disp: , Rfl:    Objective:   There were no vitals filed for this visit.  Physical Exam Vitals and nursing note reviewed.  Constitutional:      Appearance: Normal appearance.  Cardiovascular:  Rate and Rhythm: Normal rate.     Pulses: Normal pulses.  Pulmonary:     Effort: Pulmonary effort is normal.  Neurological:     General: No focal deficit present.     Mental Status: She is alert.  Psychiatric:        Mood and Affect: Mood normal.        Behavior: Behavior normal.     Assessment & Plan:  Leg wound, left, initial encounter  Donated XCellistem was placed on the superior anterior aspect.  The Adaptic and VAC were applied.  Plan for OR for further debridement and placement of product next week. Pictures were obtained of the patient and placed in the chart with the patient's or guardian's permission.   [place], DO

## 2020-02-12 ENCOUNTER — Encounter: Payer: Self-pay | Admitting: Plastic Surgery

## 2020-02-12 ENCOUNTER — Ambulatory Visit (INDEPENDENT_AMBULATORY_CARE_PROVIDER_SITE_OTHER): Payer: Medicaid Other | Admitting: Plastic Surgery

## 2020-02-12 ENCOUNTER — Ambulatory Visit (INDEPENDENT_AMBULATORY_CARE_PROVIDER_SITE_OTHER): Payer: Medicaid Other | Admitting: Internal Medicine

## 2020-02-12 ENCOUNTER — Encounter: Payer: Self-pay | Admitting: Internal Medicine

## 2020-02-12 VITALS — BP 116/75 | HR 80 | Temp 98.0°F | Ht 68.0 in | Wt 275.0 lb

## 2020-02-12 DIAGNOSIS — M793 Panniculitis, unspecified: Secondary | ICD-10-CM | POA: Diagnosis not present

## 2020-02-12 DIAGNOSIS — L88 Pyoderma gangrenosum: Secondary | ICD-10-CM | POA: Diagnosis not present

## 2020-02-12 DIAGNOSIS — L03116 Cellulitis of left lower limb: Secondary | ICD-10-CM | POA: Diagnosis present

## 2020-02-12 DIAGNOSIS — M60062 Infective myositis, left lower leg: Secondary | ICD-10-CM | POA: Diagnosis not present

## 2020-02-12 DIAGNOSIS — L089 Local infection of the skin and subcutaneous tissue, unspecified: Secondary | ICD-10-CM

## 2020-02-12 DIAGNOSIS — S81802D Unspecified open wound, left lower leg, subsequent encounter: Secondary | ICD-10-CM

## 2020-02-12 MED ORDER — CEPHALEXIN 500 MG PO CAPS: 500.0000 mg | ORAL_CAPSULE | Freq: Four times a day (QID) | ORAL | 0 refills | Status: AC

## 2020-02-12 NOTE — Progress Notes (Signed)
[facility] for Infectious Disease  CHIEF COMPLAINT:    Follow up for wound infection  SUBJECTIVE:    [name] is a 35 y.o. female with PMHx as below who presents to the clinic for follow up of wound infection.   She was hospitalized last month with concerns for superinfection of chronic lower extremity wound.  Dr Marla Roe with plastic surgery took her to the OR and debrided.  She completed 4 weeks of cefepime for this infection on 02/05/2020.  PICC line was pulled at end of therapy.  She saw plastics for follow up on 02/04/20.  Wound vac removed at that time and there was no malodor or sign of infection at that time.  She is going back to the OR on 02/19/2020 for further I&D.  Wound vac has been replaced.  She finished steroids a couple days ago.  Please see A&P for the details of today's visit and status of the patient's medical problems.   Patient's Medications  New Prescriptions   No medications on file  Previous Medications   DOCUSATE SODIUM (COLACE) 100 MG CAPSULE    Take 1 capsule (100 mg total) by mouth daily.   FARXIGA 5 MG TABS TABLET    Take 5 mg by mouth daily.   FERROUS SULFATE 325 (65 FE) MG TABLET    Take 1 tablet (325 mg total) by mouth 2 (two) times daily with a meal.   FLUCONAZOLE (DIFLUCAN) 150 MG TABLET    Take 1 tablet (150 mg total) by mouth every 3 (three) days as needed for up to 3 doses.   METFORMIN (GLUCOPHAGE) 500 MG TABLET    Take 500 mg by mouth 2 (two) times daily with a meal.   PANTOPRAZOLE (PROTONIX) 40 MG TABLET    Take 1 tablet (40 mg total) by mouth 2 (two) times daily before a meal.   PREGABALIN (LYRICA) 100 MG CAPSULE    Take 100 mg by mouth 3 (three) times daily.  Modified Medications   No medications on file  Discontinued Medications   No medications on file      Past Medical History:  Diagnosis Date  . Diabetes mellitus without complication ([place])    with pregnancy  . Pyoderma gangrenosa     Social History   Tobacco  Use  . Smoking status: Current Some Day Smoker    Packs/day: 0.50    Types: Cigarettes  . Smokeless tobacco: Never Used  Substance Use Topics  . Alcohol use: No  . Drug use: No    No family history on file.  Allergies  Allergen Reactions  . Carbamazepine Other (See Comments)    Causes aplastic anemia Other reaction(s): Other (See Comments) Causes aplastic anemia "TEGRETOL"   . Zolpidem Tartrate Rash    "AMBIEN"     Review of Systems  Constitutional: Negative for chills and fever.  Respiratory: Negative.   Cardiovascular: Negative.   Gastrointestinal: Negative.   Skin:       + wound     OBJECTIVE:    Vitals:   02/12/20 0929  BP: 116/75  Pulse: 80  Temp: 98 F (36.7 C)  Weight: 275 lb (124.7 kg)  Height: 5\' 8"  (1.727 m)   Body mass index is 41.81 kg/m.  Physical Exam Constitutional:      General: She is not in acute distress.    Appearance: Normal appearance.  Musculoskeletal:     Comments: Left leg with wound vac on and  wrapped in bandage.   Skin:    General: Skin is warm and dry.     Findings: No rash.  Neurological:     General: No focal deficit present.     Mental Status: She is alert and oriented to person, place, and time.      Labs and Microbiology: CBC Latest Ref Rng & Units 01/12/2020 01/11/2020 01/10/2020  WBC 4.0 - 10.5 K/uL 13.0(H) 12.0(H) 11.9(H)  Hemoglobin 12.0 - 15.0 g/dL 9.7(L) 9.7(L) 9.5(L)  Hematocrit 36.0 - 46.0 % 31.6(L) 33.0(L) 32.6(L)  Platelets 150 - 400 K/uL 413(H) 414(H) 436(H)   CMP Latest Ref Rng & Units 01/12/2020 01/11/2020 01/10/2020  Glucose 70 - 99 mg/dL 136(H) 120(H) 130(H)  BUN 6 - 20 mg/dL 22(H) 15 13  Creatinine 0.44 - 1.00 mg/dL 0.71 0.71 0.80  Sodium 135 - 145 mmol/L 138 137 137  Potassium 3.5 - 5.1 mmol/L 4.0 3.7 3.7  Chloride 98 - 111 mmol/L 105 105 106  CO2 22 - 32 mmol/L 21(L) 21(L) 21(L)  Calcium 8.9 - 10.3 mg/dL 9.3 9.1 9.1  Total Protein 6.5 - 8.1 g/dL - - -  Total Bilirubin 0.3 - 1.2 mg/dL - - -   Alkaline Phos 38 - 126 U/L - - -  AST 15 - 41 U/L - - -  ALT 0 - 44 U/L - - -       ASSESSMENT & PLAN:    1. Cellulitis and Myositis Involving the left lower leg and status post I&D on 01/08/20.  Cultures polymicrobial and surgical path showed panniculitis. Completed 4 weeks of IV antibiotics with no current evidence of ongoing infection.  2. Pyoderma gangrenosum Close follow up with dermatology and plastic surgery.  Ultimately, feel that wound healing will mostly be dictated by treatment of her underlying disease process.    -- Follow up with ID as needed -- Needs close follow up with dermatology and plastic surgery     Raynelle Highland for Infectious Disease [facility] 02/12/2020, 9:39 AM

## 2020-02-17 ENCOUNTER — Other Ambulatory Visit (HOSPITAL_COMMUNITY): Payer: Medicaid Other

## 2020-02-17 ENCOUNTER — Telehealth: Payer: Self-pay

## 2020-02-17 NOTE — Telephone Encounter (Signed)
Call to pt to confirm that she did receive the [employer]- wound/vac canisters. I faxed the requested/necessary information regarding her hospital discharge notes. Pt did receive the canisters. She has a f/u visit with Venetia Night, [place] this Thurs 02/20/20 She will call for any other concerns.

## 2020-02-20 ENCOUNTER — Encounter: Payer: Self-pay | Admitting: Plastic Surgery

## 2020-02-20 ENCOUNTER — Ambulatory Visit: Payer: Medicaid Other | Admitting: Plastic Surgery

## 2020-02-20 ENCOUNTER — Ambulatory Visit (INDEPENDENT_AMBULATORY_CARE_PROVIDER_SITE_OTHER): Payer: Medicaid Other | Admitting: Plastic Surgery

## 2020-02-20 ENCOUNTER — Other Ambulatory Visit: Payer: Self-pay

## 2020-02-20 VITALS — BP 113/76 | HR 76

## 2020-02-20 DIAGNOSIS — S81802D Unspecified open wound, left lower leg, subsequent encounter: Secondary | ICD-10-CM

## 2020-02-20 DIAGNOSIS — Z4689 Encounter for fitting and adjustment of other specified devices: Secondary | ICD-10-CM

## 2020-02-20 NOTE — Progress Notes (Signed)
Patient is a 35 yr old female here for follow up and Wound VAC change after undergoing excision of left leg wound (14 x 16 x 3 cm), placement of Stravix and Acell, and placement of VAC on 01/08/20 with Dr. Marla Roe. She has been doing weekly in office VAC changes.  At last visit on 02/11/20, wound size was 12 x 15 x 1 cm and donated XCellistem was placed on the superior anterior aspect.   Patient reports she is doing very well today.  Denies fever/chills, nausea/vomiting, wound pain.  Reports she has had some increased drainage in the canister over the last 2 days.  Wound shows good improvement with continued incorporation of product.  She has 1 small area of tendon exposure superior center of the wound.  Donated ACell was placed today in that area.  No signs of infection.  Mild chronic wound erythema of surrounding skin.  Some swelling in the ankle and foot.  Patient reports she has been up doing more.  Wound VAC was changed today.  Recommend elevating leg when possible.  Plan is to return to the OR next week for irrigation and debridement of left leg wound and application of ACell and wound VAC.  Patient is in agreement with plan.  Return precautions provided.  Call office with any questions/concerns.   Pictures were obtained of the patient and placed in the chart with the patient's or guardian's permission.

## 2020-02-21 ENCOUNTER — Other Ambulatory Visit (HOSPITAL_COMMUNITY): Payer: Medicaid Other

## 2020-02-24 ENCOUNTER — Other Ambulatory Visit (HOSPITAL_COMMUNITY)
Admission: RE | Admit: 2020-02-24 | Discharge: 2020-02-24 | Disposition: A | Discharge status: Home / Self Care | Payer: Medicaid Other | Source: Ambulatory Visit | Attending: Plastic Surgery | Admitting: Plastic Surgery

## 2020-02-24 DIAGNOSIS — Z01812 Encounter for preprocedural laboratory examination: Secondary | ICD-10-CM | POA: Diagnosis present

## 2020-02-24 DIAGNOSIS — Z20822 Contact with and (suspected) exposure to covid-19: Secondary | ICD-10-CM | POA: Insufficient documentation

## 2020-02-24 LAB — SARS CORONAVIRUS 2 (TAT 6-24 HRS): SARS Coronavirus 2: NEGATIVE

## 2020-02-25 ENCOUNTER — Other Ambulatory Visit (INDEPENDENT_AMBULATORY_CARE_PROVIDER_SITE_OTHER): Payer: Medicaid Other | Admitting: Plastic Surgery

## 2020-02-25 ENCOUNTER — Encounter: Payer: Medicaid Other | Admitting: Plastic Surgery

## 2020-02-25 ENCOUNTER — Telehealth: Payer: Self-pay

## 2020-02-25 DIAGNOSIS — Z4689 Encounter for fitting and adjustment of other specified devices: Secondary | ICD-10-CM

## 2020-02-25 MED ORDER — HYDROCODONE-ACETAMINOPHEN 5-325 MG PO TABS: 1.0000 | ORAL_TABLET | Freq: Three times a day (TID) | ORAL | 0 refills | Status: DC | PRN

## 2020-02-25 NOTE — Progress Notes (Signed)
Per Pain Management (Greta O-Buch, PA-C) at [facility], Patient may take pain medication after surgery. She may stop suboxone while on narcotic medication and restart when narcotic done. Sent RX to pharmacy for [employer]

## 2020-02-25 NOTE — Telephone Encounter (Signed)
orders for wound/vac faxed to [facility] per Dr. Marla Roe- for the following Surgery 02/27/20 - debridement/acell & wound vac placement Order for 90 day supply to be delivered to pt's house. Fax confirmation received.

## 2020-02-26 ENCOUNTER — Encounter (HOSPITAL_COMMUNITY): Payer: Self-pay | Admitting: Plastic Surgery

## 2020-02-26 ENCOUNTER — Telehealth: Payer: Self-pay

## 2020-02-26 ENCOUNTER — Other Ambulatory Visit: Payer: Self-pay

## 2020-02-26 NOTE — Telephone Encounter (Signed)
Call to pt per Venetia Night, PA to inform her of the following: She is directed by her MD to stop taking her Suboxone while taking RX- narcotic med for postop pain & she can resume it when she is no longer taking the pain med. Pt voiced understanding of the above order.

## 2020-02-26 NOTE — Progress Notes (Signed)
PCP - Jeanie Sewer, NP Cardiologist - n/a  Chest x-ray - n/a EKG - n/a Stress Test - n/a ECHO - n/a Cardiac Cath - n/a  Fasting Blood Sugar - 82-130s Checks Blood Sugar 3-4 times a day  . Do not take glipizide tonight or on the morning of surgery.    . Do not take metformin on day of surgery.  . Do not take Wilder Glade today (pt already took med this am) or on day of surgery.  . Last dose of suboxone was on 02/25/20 per patient.  STOP now taking any Aspirin (unless otherwise instructed by your surgeon), Aleve, Naproxen, Ibuprofen, Motrin, Advil, Goody's, BC's, all herbal medications, fish oil, and all vitamins.   Coronavirus Screening Covid test on 02/24/20 was negative. Do you have any of the following symptoms:  Cough yes/no: No Fever (>100.24F)  yes/no: No Runny nose yes/no: No Sore throat yes/no: No Difficulty breathing/shortness of breath  yes/no: No  Have you traveled in the last 14 days and where? yes/no: No  Patient verbalized understanding of instructions that were given via phone.

## 2020-02-26 NOTE — Telephone Encounter (Signed)
[place] Tracks Form faxed to fax# [phone] per Zenia Resides request for [facility]- wound vac order (x3 months)

## 2020-02-27 ENCOUNTER — Ambulatory Visit (HOSPITAL_COMMUNITY)
Admission: RE | Admit: 2020-02-27 | Discharge: 2020-02-27 | Disposition: A | Discharge status: Home / Self Care | Payer: Medicaid Other | Attending: Plastic Surgery | Admitting: Plastic Surgery

## 2020-02-27 ENCOUNTER — Ambulatory Visit (HOSPITAL_COMMUNITY): Payer: Medicaid Other | Admitting: Certified Registered Nurse Anesthetist

## 2020-02-27 ENCOUNTER — Encounter (HOSPITAL_COMMUNITY)
Admission: RE | Disposition: A | Discharge status: Home / Self Care | Payer: Self-pay | Source: Home / Self Care | Attending: Plastic Surgery

## 2020-02-27 ENCOUNTER — Encounter (HOSPITAL_COMMUNITY): Payer: Self-pay | Admitting: Plastic Surgery

## 2020-02-27 DIAGNOSIS — Z79899 Other long term (current) drug therapy: Secondary | ICD-10-CM | POA: Insufficient documentation

## 2020-02-27 DIAGNOSIS — Z7984 Long term (current) use of oral hypoglycemic drugs: Secondary | ICD-10-CM | POA: Diagnosis not present

## 2020-02-27 DIAGNOSIS — S81802A Unspecified open wound, left lower leg, initial encounter: Secondary | ICD-10-CM | POA: Diagnosis not present

## 2020-02-27 DIAGNOSIS — X58XXXA Exposure to other specified factors, initial encounter: Secondary | ICD-10-CM | POA: Insufficient documentation

## 2020-02-27 DIAGNOSIS — Z87891 Personal history of nicotine dependence: Secondary | ICD-10-CM | POA: Insufficient documentation

## 2020-02-27 DIAGNOSIS — S81809A Unspecified open wound, unspecified lower leg, initial encounter: Secondary | ICD-10-CM | POA: Diagnosis not present

## 2020-02-27 HISTORY — DX: Anemia, unspecified: D64.9

## 2020-02-27 HISTORY — DX: Depression, unspecified: F32.A

## 2020-02-27 HISTORY — PX: INCISION AND DRAINAGE OF WOUND: SHX1803

## 2020-02-27 HISTORY — DX: Anxiety disorder, unspecified: F41.9

## 2020-02-27 HISTORY — DX: Myoneural disorder, unspecified: G70.9

## 2020-02-27 HISTORY — DX: Gastro-esophageal reflux disease without esophagitis: K21.9

## 2020-02-27 HISTORY — PX: APPLICATION OF WOUND VAC: SHX5189

## 2020-02-27 LAB — CBC
HCT: 37.5 % (ref 36.0–46.0)
Hemoglobin: 11.4 g/dL — ABNORMAL LOW (ref 12.0–15.0)
MCH: 24.3 pg — ABNORMAL LOW (ref 26.0–34.0)
MCHC: 30.4 g/dL (ref 30.0–36.0)
MCV: 80 fL (ref 80.0–100.0)
Platelets: 308 10*3/uL (ref 150–400)
RBC: 4.69 MIL/uL (ref 3.87–5.11)
RDW: 17.3 % — ABNORMAL HIGH (ref 11.5–15.5)
WBC: 7.2 10*3/uL (ref 4.0–10.5)
nRBC: 0 % (ref 0.0–0.2)

## 2020-02-27 LAB — BASIC METABOLIC PANEL
Anion gap: 12 (ref 5–15)
BUN: 12 mg/dL (ref 6–20)
CO2: 18 mmol/L — ABNORMAL LOW (ref 22–32)
Calcium: 8.9 mg/dL (ref 8.9–10.3)
Chloride: 107 mmol/L (ref 98–111)
Creatinine, Ser: 0.72 mg/dL (ref 0.44–1.00)
GFR, Estimated: >60 mL/min (ref >60)
Glucose, Bld: 134 mg/dL — ABNORMAL HIGH (ref 70–99)
Potassium: 3.9 mmol/L (ref 3.5–5.1)
Sodium: 137 mmol/L (ref 135–145)

## 2020-02-27 LAB — GLUCOSE, CAPILLARY
Glucose-Capillary: 118 mg/dL — ABNORMAL HIGH (ref 70–99)
Glucose-Capillary: 119 mg/dL — ABNORMAL HIGH (ref 70–99)
Glucose-Capillary: 105 mg/dL — ABNORMAL HIGH (ref 70–99)

## 2020-02-27 LAB — POCT PREGNANCY, URINE: Preg Test, Ur: NEGATIVE

## 2020-02-27 SURGERY — IRRIGATION AND DEBRIDEMENT WOUND
Anesthesia: General | Laterality: Left

## 2020-02-27 MED ORDER — LIDOCAINE 2% (20 MG/ML) 5 ML SYRINGE
INTRAMUSCULAR | Status: DC | PRN
  Administered 2020-02-27: 60 mg via INTRAVENOUS

## 2020-02-27 MED ORDER — PROPOFOL 10 MG/ML IV BOLUS
INTRAVENOUS | Status: DC | PRN
  Administered 2020-02-27: 200 mg via INTRAVENOUS

## 2020-02-27 MED ORDER — FENTANYL CITRATE (PF) 100 MCG/2ML IJ SOLN
INTRAMUSCULAR | Status: AC
  Filled 2020-02-27: qty 2

## 2020-02-27 MED ORDER — OXYCODONE HCL 5 MG PO TABS
ORAL_TABLET | ORAL | Status: AC
  Filled 2020-02-27: qty 1

## 2020-02-27 MED ORDER — FENTANYL CITRATE (PF) 250 MCG/5ML IJ SOLN
INTRAMUSCULAR | Status: AC
  Filled 2020-02-27: qty 5

## 2020-02-27 MED ORDER — PROMETHAZINE HCL 25 MG/ML IJ SOLN: 6.2500 mg | INTRAMUSCULAR | Status: DC | PRN

## 2020-02-27 MED ORDER — KETOROLAC TROMETHAMINE 30 MG/ML IJ SOLN
30.0000 mg | Freq: Once | INTRAMUSCULAR | Status: AC | PRN
  Administered 2020-02-27: 30 mg via INTRAVENOUS

## 2020-02-27 MED ORDER — ONDANSETRON HCL 4 MG/2ML IJ SOLN
INTRAMUSCULAR | Status: AC
  Filled 2020-02-27: qty 2

## 2020-02-27 MED ORDER — ARTIFICIAL TEARS OPHTHALMIC OINT
TOPICAL_OINTMENT | OPHTHALMIC | Status: AC
  Filled 2020-02-27: qty 3.5

## 2020-02-27 MED ORDER — PHENYLEPHRINE 40 MCG/ML (10ML) SYRINGE FOR IV PUSH (FOR BLOOD PRESSURE SUPPORT)
PREFILLED_SYRINGE | INTRAVENOUS | Status: DC | PRN
  Administered 2020-02-27 (×4): 80 ug via INTRAVENOUS

## 2020-02-27 MED ORDER — PROPOFOL 10 MG/ML IV BOLUS
INTRAVENOUS | Status: AC
  Filled 2020-02-27: qty 20

## 2020-02-27 MED ORDER — LACTATED RINGERS IV SOLN: INTRAVENOUS | Status: DC

## 2020-02-27 MED ORDER — ONDANSETRON HCL 4 MG/2ML IJ SOLN
INTRAMUSCULAR | Status: DC | PRN
  Administered 2020-02-27: 4 mg via INTRAVENOUS

## 2020-02-27 MED ORDER — ACETAMINOPHEN 650 MG RE SUPP: 650.0000 mg | RECTAL | Status: DC | PRN

## 2020-02-27 MED ORDER — SODIUM CHLORIDE 0.9% FLUSH: 3.0000 mL | INTRAVENOUS | Status: DC | PRN

## 2020-02-27 MED ORDER — ACETAMINOPHEN 325 MG PO TABS: 650.0000 mg | ORAL_TABLET | ORAL | Status: DC | PRN

## 2020-02-27 MED ORDER — CHLORHEXIDINE GLUCONATE CLOTH 2 % EX PADS: 6.0000 | MEDICATED_PAD | Freq: Once | CUTANEOUS | Status: DC

## 2020-02-27 MED ORDER — LIDOCAINE 2% (20 MG/ML) 5 ML SYRINGE
INTRAMUSCULAR | Status: AC
  Filled 2020-02-27: qty 5

## 2020-02-27 MED ORDER — CHLORHEXIDINE GLUCONATE 0.12 % MT SOLN: 15.0000 mL | Freq: Once | OROMUCOSAL | Status: AC

## 2020-02-27 MED ORDER — KETOROLAC TROMETHAMINE 30 MG/ML IJ SOLN
INTRAMUSCULAR | Status: AC
  Filled 2020-02-27: qty 1

## 2020-02-27 MED ORDER — DEXAMETHASONE SODIUM PHOSPHATE 10 MG/ML IJ SOLN
INTRAMUSCULAR | Status: DC | PRN
  Administered 2020-02-27: 4 mg via INTRAVENOUS

## 2020-02-27 MED ORDER — FENTANYL CITRATE (PF) 100 MCG/2ML IJ SOLN
25.0000 ug | INTRAMUSCULAR | Status: DC | PRN
  Administered 2020-02-27: 25 ug via INTRAVENOUS

## 2020-02-27 MED ORDER — OXYCODONE HCL 5 MG PO TABS: 5.0000 mg | ORAL_TABLET | ORAL | Status: DC | PRN

## 2020-02-27 MED ORDER — FENTANYL CITRATE (PF) 100 MCG/2ML IJ SOLN
25.0000 ug | INTRAMUSCULAR | Status: DC | PRN
Start: 2020-02-27 — End: 2020-02-27

## 2020-02-27 MED ORDER — OXYCODONE HCL 5 MG/5ML PO SOLN: 5.0000 mg | Freq: Once | ORAL | Status: AC | PRN

## 2020-02-27 MED ORDER — MIDAZOLAM HCL 5 MG/5ML IJ SOLN
INTRAMUSCULAR | Status: DC | PRN
  Administered 2020-02-27: 2 mg via INTRAVENOUS

## 2020-02-27 MED ORDER — CEFAZOLIN SODIUM-DEXTROSE 2-4 GM/100ML-% IV SOLN
2.0000 g | INTRAVENOUS | Status: AC
  Administered 2020-02-27: 2 g via INTRAVENOUS

## 2020-02-27 MED ORDER — DEXAMETHASONE SODIUM PHOSPHATE 10 MG/ML IJ SOLN
INTRAMUSCULAR | Status: AC
  Filled 2020-02-27: qty 1

## 2020-02-27 MED ORDER — 0.9 % SODIUM CHLORIDE (POUR BTL) OPTIME
TOPICAL | Status: DC | PRN
  Administered 2020-02-27: 3000 mL

## 2020-02-27 MED ORDER — ACETAMINOPHEN 500 MG PO TABS
1000.0000 mg | ORAL_TABLET | Freq: Once | ORAL | Status: AC
  Administered 2020-02-27: 1000 mg via ORAL
  Filled 2020-02-27: qty 2

## 2020-02-27 MED ORDER — SODIUM CHLORIDE 0.9% FLUSH: 3.0000 mL | Freq: Two times a day (BID) | INTRAVENOUS | Status: DC

## 2020-02-27 MED ORDER — MIDAZOLAM HCL 2 MG/2ML IJ SOLN
INTRAMUSCULAR | Status: AC
  Filled 2020-02-27: qty 2

## 2020-02-27 MED ORDER — CHLORHEXIDINE GLUCONATE 0.12 % MT SOLN
OROMUCOSAL | Status: AC
  Administered 2020-02-27: 15 mL via OROMUCOSAL
  Filled 2020-02-27: qty 15

## 2020-02-27 MED ORDER — LIDOCAINE-EPINEPHRINE 1 %-1:100000 IJ SOLN
INTRAMUSCULAR | Status: AC
  Filled 2020-02-27: qty 1

## 2020-02-27 MED ORDER — FENTANYL CITRATE (PF) 250 MCG/5ML IJ SOLN
INTRAMUSCULAR | Status: DC | PRN
  Administered 2020-02-27: 100 ug via INTRAVENOUS

## 2020-02-27 MED ORDER — SODIUM CHLORIDE 0.9 % IV SOLN
250.0000 mL | INTRAVENOUS | Status: DC | PRN
Start: 2020-02-27 — End: 2020-02-27

## 2020-02-27 MED ORDER — OXYCODONE HCL 5 MG PO TABS
5.0000 mg | ORAL_TABLET | Freq: Once | ORAL | Status: AC | PRN
  Administered 2020-02-27: 5 mg via ORAL

## 2020-02-27 MED ORDER — CEFAZOLIN SODIUM-DEXTROSE 2-4 GM/100ML-% IV SOLN
INTRAVENOUS | Status: AC
  Filled 2020-02-27: qty 100

## 2020-02-27 MED ORDER — ORAL CARE MOUTH RINSE: 15.0000 mL | Freq: Once | OROMUCOSAL | Status: AC

## 2020-02-27 MED ORDER — PHENYLEPHRINE 40 MCG/ML (10ML) SYRINGE FOR IV PUSH (FOR BLOOD PRESSURE SUPPORT)
PREFILLED_SYRINGE | INTRAVENOUS | Status: AC
  Filled 2020-02-27: qty 10

## 2020-02-27 SURGICAL SUPPLY — 58 items
APPLICATOR COTTON TIP 6 STRL (MISCELLANEOUS) IMPLANT
APPLICATOR COTTON TIP 6IN STRL (MISCELLANEOUS)
BAG DECANTER FOR FLEXI CONT (MISCELLANEOUS) IMPLANT
BENZOIN TINCTURE PRP APPL 2/3 (GAUZE/BANDAGES/DRESSINGS) ×2 IMPLANT
BNDG ELASTIC 4X5.8 VLCR STR LF (GAUZE/BANDAGES/DRESSINGS) ×2 IMPLANT
BNDG ELASTIC 6X5.8 VLCR STR LF (GAUZE/BANDAGES/DRESSINGS) ×2 IMPLANT
BNDG GAUZE ELAST 4 BULKY (GAUZE/BANDAGES/DRESSINGS) ×4 IMPLANT
CANISTER SUCT 3000ML PPV (MISCELLANEOUS) ×2 IMPLANT
CNTNR URN SCR LID CUP LEK RST (MISCELLANEOUS) IMPLANT
CONT SPEC 4OZ STRL OR WHT (MISCELLANEOUS)
COVER SURGICAL LIGHT HANDLE (MISCELLANEOUS) ×2 IMPLANT
COVER WAND RF STERILE (DRAPES) ×2 IMPLANT
DRAIN CHANNEL 19F RND (DRAIN) IMPLANT
DRAIN JP 10F RND SILICONE (MISCELLANEOUS) IMPLANT
DRAPE HALF SHEET 40X57 (DRAPES) IMPLANT
DRAPE IMP U-DRAPE 54X76 (DRAPES) ×2 IMPLANT
DRAPE INCISE IOBAN 66X45 STRL (DRAPES) IMPLANT
DRAPE LAPAROSCOPIC ABDOMINAL (DRAPES) IMPLANT
DRAPE LAPAROTOMY 100X72 PEDS (DRAPES) ×2 IMPLANT
DRSG ADAPTIC 3X8 NADH LF (GAUZE/BANDAGES/DRESSINGS) IMPLANT
DRSG CUTIMED SORBACT 7X9 (GAUZE/BANDAGES/DRESSINGS) ×2 IMPLANT
DRSG HYDROCOLLOID 4X4 (GAUZE/BANDAGES/DRESSINGS) ×2 IMPLANT
DRSG PAD ABDOMINAL 8X10 ST (GAUZE/BANDAGES/DRESSINGS) IMPLANT
DRSG VAC ATS LRG SENSATRAC (GAUZE/BANDAGES/DRESSINGS) ×2 IMPLANT
DRSG VAC ATS MED SENSATRAC (GAUZE/BANDAGES/DRESSINGS) IMPLANT
DRSG VAC ATS SM SENSATRAC (GAUZE/BANDAGES/DRESSINGS) IMPLANT
ELECT CAUTERY BLADE 6.4 (BLADE) IMPLANT
ELECT REM PT RETURN 9FT ADLT (ELECTROSURGICAL) ×2
ELECTRODE REM PT RTRN 9FT ADLT (ELECTROSURGICAL) ×1 IMPLANT
GAUZE SPONGE 4X4 12PLY STRL (GAUZE/BANDAGES/DRESSINGS) IMPLANT
GLOVE BIO SURGEON STRL SZ 6.5 (GLOVE) ×2 IMPLANT
GLOVE BIOGEL M 6.5 STRL (GLOVE) ×2 IMPLANT
GOWN STRL REUS W/ TWL LRG LVL3 (GOWN DISPOSABLE) ×3 IMPLANT
GOWN STRL REUS W/TWL LRG LVL3 (GOWN DISPOSABLE) ×3
GRAFT MYRIAD 3 LAYER 10X10 (Graft) ×2 IMPLANT
KIT BASIN OR (CUSTOM PROCEDURE TRAY) ×2 IMPLANT
KIT TURNOVER KIT B (KITS) ×2 IMPLANT
MATRIX WOUND 3-LAYER 7X10 (Tissue) ×4 IMPLANT
MICROMATRIX 1000MG (Tissue) ×4 IMPLANT
NEEDLE HYPO 25GX1X1/2 BEV (NEEDLE) ×2 IMPLANT
NS IRRIG 1000ML POUR BTL (IV SOLUTION) ×2 IMPLANT
PACK GENERAL/GYN (CUSTOM PROCEDURE TRAY) ×2 IMPLANT
PACK UNIVERSAL I (CUSTOM PROCEDURE TRAY) ×2 IMPLANT
PAD ARMBOARD 7.5X6 YLW CONV (MISCELLANEOUS) ×4 IMPLANT
SOLUTION PARTIC MCRMTRX 1000MG (Tissue) ×2 IMPLANT
STAPLER VISISTAT 35W (STAPLE) ×2 IMPLANT
SURGILUBE 2OZ TUBE FLIPTOP (MISCELLANEOUS) IMPLANT
SUT MNCRL AB 3-0 PS2 27 (SUTURE) IMPLANT
SUT MNCRL AB 4-0 PS2 18 (SUTURE) IMPLANT
SUT MON AB 2-0 CT1 36 (SUTURE) IMPLANT
SUT MON AB 5-0 PS2 18 (SUTURE) IMPLANT
SUT VIC AB 5-0 PS2 18 (SUTURE) ×4 IMPLANT
SUT VICRYL 3 0 (SUTURE) IMPLANT
SWAB COLLECTION DEVICE MRSA (MISCELLANEOUS) IMPLANT
SWAB CULTURE ESWAB REG 1ML (MISCELLANEOUS) IMPLANT
SYR CONTROL 10ML LL (SYRINGE) ×2 IMPLANT
TOWEL GREEN STERILE (TOWEL DISPOSABLE) ×2 IMPLANT
UNDERPAD 30X36 HEAVY ABSORB (UNDERPADS AND DIAPERS) ×2 IMPLANT

## 2020-02-27 NOTE — Discharge Instructions (Addendum)
Wound Care with Acell ° °Guide to Wound Care  °Proper wound care may reduce the risk of infection, improve healing rates, and limit scarring.  This is a general guide to help care for and manage wounds treated with ACell MicroMatrix?or Cytal® Wound Matrix.  ° °Dressing Changes °The frequency of dressing changes can vary based on which product was applied, the size of the wound, or the amount of wound drainage. Dressing inspections are recommended, at least weekly.   °If you have a Wound VAC it will be changed in one week after the first time it is applied.  Then it will be changed once or twice a week.   °If you don't have a Wound VAC, then place KY gel on the wound daily and cover with gauze. ° °Dressing Types °Primary Dressing:  Non-adherent dressing goes directly over wounds being treated with the powder or sheet (MicroMatrix and/or Cytal).  °Secondary Dressing:  Secures the primary dressing in place and provides extra protection, compression, and absorption. ° °1. Wash Hands - To help decrease the risk of infection, caregivers should wash their hands for a minimum of 20 seconds and may use medical gloves.  ° °2. Remove the Dressings - Avoid removing product from the wound by carefully removing the applicable dressing(s) at the time points recommended above, or as recommended by the treating physician.  °Expected Color and Odor:  It is entirely normal for the wound to have an unpleasant odor and to form a caramel-colored gel as the product absorbs into the wound. It is  °important to leave this gel on the wound site. ° °3. Clean the Wound - Use clean water or saline to gently rinse around the wound surface and remove any excess discharge that may be present on the wound. Do not wipe off any of the caramel-colored gel on the wound.  ° °What to look out for: °• Large or increased amount of drainage  °• Surrounding skin has worsening redness or hot to touch  °• Increased pain in or around the wound  °• Flu-like  symptoms, fatigue, decreased appetite, fever  °• Hard, crusty wound surface with black or brown coloring ° °4. Apply New Dressings - Dressings should cover the entire wound and be suitable for maintaining a moist wound environment.  The non-adherent mesh dressing should be left in place.  New dressing should consist of KY Jelly to keep the wound moist and soft gauze secured with a wrap or tape.  ° °Maintain a Hydrated Wound Area °It is important to keep the wound area moist throughout the healing process. If the wound appears to be dry during dressing changes, select a dressing that will hydrate the wound and maintain that ideal moist environment. If you are unsure what to do, ask the treating physician. ° °Remodeling Process °Every patient heals differently, and no two cases are the same. The size and location of the wound, product type and layering configurations, and general patient health all contribute to how quickly a wound will heal.  While many factors can influence the rate at which the product absorbs, the following can be used as a general guide.  ° °THINGS TO DO: °Refrain from smoking °High protein diet with plenty of vegetables and some fruit  °Limit simple processed carbohydrates and sugar °Protect the wound from trauma °Protect the dressing ° °Micromatrix powder       Cytal Sheet            Sorbact dressing ° ° °

## 2020-02-27 NOTE — Anesthesia Preprocedure Evaluation (Addendum)
Anesthesia Evaluation  Patient identified by MRN, date of birth, ID band Patient awake    Reviewed: Allergy & Precautions, NPO status , Patient's Chart, lab work & pertinent test results  Airway Mallampati: II  TM Distance: >3 FB Neck ROM: Full    Dental no notable dental hx.    Pulmonary former smoker,    Pulmonary exam normal breath sounds clear to auscultation       Cardiovascular negative cardio ROS Normal cardiovascular exam Rhythm:Regular Rate:Normal  ECG: ST, rate 115   Neuro/Psych PSYCHIATRIC DISORDERS Anxiety Depression negative neurological ROS     GI/Hepatic GERD  Medicated and Controlled,(+)     substance abuse  ,   Endo/Other  diabetes, Oral Hypoglycemic AgentsMorbid obesity  Renal/GU negative Renal ROS     Musculoskeletal negative musculoskeletal ROS (+)   Abdominal   Peds  Hematology negative hematology ROS (+)   Anesthesia Other Findings LEFT LEG WOUND  Reproductive/Obstetrics                            Anesthesia Physical Anesthesia Plan  ASA: III  Anesthesia Plan: General   Post-op Pain Management:    Induction: Intravenous  PONV Risk Score and Plan: 3 and Ondansetron, Dexamethasone, Midazolam and Treatment may vary due to age or medical condition  Airway Management Planned: LMA  Additional Equipment:   Intra-op Plan:   Post-operative Plan: Extubation in OR  Informed Consent: I have reviewed the patients History and Physical, chart, labs and discussed the procedure including the risks, benefits and alternatives for the proposed anesthesia with the patient or authorized representative who has indicated his/her understanding and acceptance.     Dental advisory given  Plan Discussed with: CRNA  Anesthesia Plan Comments:         Anesthesia Quick Evaluation

## 2020-02-27 NOTE — Progress Notes (Signed)
Wasted 75 mcgs of fentanyl with Ileene Rubens, RN in pyxis room. Patient no longer in pyxis due to discharge.  Rowe Pavy, RN

## 2020-02-27 NOTE — Transfer of Care (Signed)
Immediate Anesthesia Transfer of Care Note  Patient: [name]  Procedure(s) Performed: LEFT LEG IRRIGATION AND DEBRIDEMENT WOUND (Left ) APPLICATION OF ACELL AND WOUND VAC (Left )  Patient Location: PACU  Anesthesia Type:General  Level of Consciousness: drowsy  Airway & Oxygen Therapy: Patient Spontanous Breathing and Patient connected to nasal cannula oxygen  Post-op Assessment: Report given to RN and Post -op Vital signs reviewed and stable  Post vital signs: Reviewed and stable  Last Vitals:  Vitals Value Taken Time  BP 130/71 02/27/20 1223  Temp    Pulse 96 02/27/20 1224  Resp 12 02/27/20 1224  SpO2 100 % 02/27/20 1224  Vitals shown include unvalidated device data.  Last Pain:  Vitals:   02/27/20 0848  TempSrc: Oral         Complications: No complications documented.

## 2020-02-27 NOTE — Anesthesia Postprocedure Evaluation (Signed)
Anesthesia Post Note  Patient: [name]  Procedure(s) Performed: LEFT LEG IRRIGATION AND DEBRIDEMENT WOUND (Left ) APPLICATION OF ACELL AND WOUND VAC (Left )     Patient location during evaluation: PACU Anesthesia Type: General Level of consciousness: awake Pain management: pain level controlled Vital Signs Assessment: post-procedure vital signs reviewed and stable Respiratory status: spontaneous breathing, nonlabored ventilation, respiratory function stable and patient connected to nasal cannula oxygen Cardiovascular status: blood pressure returned to baseline and stable Postop Assessment: no apparent nausea or vomiting Anesthetic complications: no   No complications documented.  Last Vitals:  Vitals:   02/27/20 1300 02/27/20 1308  BP:  133/81  Pulse: 80 77  Resp: 15 14  Temp:  36.5 C  SpO2: 100% 100%    Last Pain:  Vitals:   02/27/20 1223  TempSrc:   PainSc: 6                  Enrique Weiss P Keyla Milone

## 2020-02-27 NOTE — Interval H&P Note (Signed)
History and Physical Interval Note:  02/27/2020 8:33 AM  [name]  has presented today for surgery, with the diagnosis of LEFT LEG WOUND.  The various methods of treatment have been discussed with the patient and family. After consideration of risks, benefits and other options for treatment, the patient has consented to  Procedure(s): LEFT LEG IRRIGATION AND DEBRIDEMENT WOUND (Left) APPLICATION OF ACELL AND WOUND VAC (Left) as a surgical intervention.  The patient's history has been reviewed, patient examined, no change in status, stable for surgery.  I have reviewed the patient's chart and labs.  Questions were answered to the patient's satisfaction.     Loel Lofty Nattaly Yebra

## 2020-02-27 NOTE — Anesthesia Procedure Notes (Signed)
Procedure Name: LMA Insertion Date/Time: 02/27/2020 11:31 AM Performed by: Alain Marion, CRNA Pre-anesthesia Checklist: Patient identified, Emergency Drugs available, Suction available and Patient being monitored Patient Re-evaluated:Patient Re-evaluated prior to induction Oxygen Delivery Method: Circle System Utilized Preoxygenation: Pre-oxygenation with 100% oxygen Induction Type: IV induction Ventilation: Mask ventilation without difficulty LMA: LMA inserted LMA Size: 4.0 Number of attempts: 1 Airway Equipment and Method: Bite block Placement Confirmation: positive ETCO2 Tube secured with: Tape Dental Injury: Teeth and Oropharynx as per pre-operative assessment

## 2020-02-27 NOTE — Op Note (Signed)
DATE OF OPERATION: 02/27/2020  LOCATION: [facility]  PREOPERATIVE DIAGNOSIS: Left leg wound  POSTOPERATIVE DIAGNOSIS: Same  PROCEDURE: Preparation of left leg wound for placement of Acell (powder 2 gm and 7 x 10 cm sheet x 2), Miriad (10 x 10 sheet) and VAC 12 x 25 cm  SURGEON: Fanta Wimberley H. J. Heinz, DO  ASSISTANT: Phoebe Sharps, PA  EBL: 1 cc  CONDITION: Stable  COMPLICATIONS: None  INDICATION: The patient, [name], is a 35 y.o. female born on 04-Oct-1985, is here for treatment of a chronic left leg wound.   PROCEDURE DETAILS:  The patient was seen prior to surgery and marked.  The IV antibiotics were given. The patient was taken to the operating room and given a general anesthetic. A standard time out was performed and all information was confirmed by those in the room. SCD placed on right leg.   The left leg was prepped and draped.  The irrigation was used to irrigate the entire wound with normal saline.  The curette was used to debride some of the fibrous tissue that was building up.  Overall the wound had a remarkable improvement from the last procedure.  The size is 12 x 25 cm.  The wound was covered with the ACell powder which was completely utilized.  Then all of the ACell C and Gerrit Friends was placed with the ACell anterior and lateral and the miriad placed immediately.  A sorbact was applied all materials were sutured into place with a 5-0 Vicryl.  The VAC was placed and a sterile dressing was applied.  There was an excellent seal. The patient was allowed to wake up and taken to recovery room in stable condition at the end of the case. The family was notified at the end of the case.   The advanced practice practitioner (APP) assisted throughout the case.  The APP was essential in retraction and counter traction when needed to make the case progress smoothly.  This retraction and assistance made it possible to see the tissue plans for the procedure.  The assistance  was needed for blood control, tissue re-approximation and assisted with closure of the incision site.

## 2020-02-28 ENCOUNTER — Encounter (HOSPITAL_COMMUNITY): Payer: Self-pay | Admitting: Plastic Surgery

## 2020-03-03 ENCOUNTER — Encounter: Payer: Medicaid Other | Admitting: Plastic Surgery

## 2020-03-05 ENCOUNTER — Telehealth: Payer: Self-pay

## 2020-03-05 NOTE — Telephone Encounter (Signed)
Called the patient back and informed her that I spoke with [facility], and he stated if she is not been seen by Home Health then she can take the wound VAC off herself and cover the wound with KY jelly,4x4 gauze,ABD pad, and Kerlix/ACE wrap.    Keep the appointment on (03/13/20), if she can't come tomorrow.  Patient stated that she can't come tomorrow.     She does not have Home Health, but she can take the vac off and place it back on herself along with the help of her sister.  She stated that she has seen it been done enough, that is will keep it's seal.   Informed the patient if she does have any issues getting the vac back on she can do the wound dressing changing.  Patient stated that she has the wound dressing changes down.  Patient verbalized understanding and agreed.//AB/CMA

## 2020-03-05 NOTE — Telephone Encounter (Signed)
Patient called to state she will not be able to make it to her post op tomorrow, 03/06/2020 because she has to take her mother to an appointment. She has not been seen in clinic since her surgery on 02/27/2020 and would like to know if it is possible to keep the appointment that is already scheduled with Dr. Marla Roe on 03/13/2020; however, she still has the wound Vac on and would like to know if it is ok to wait until 03/11 to be seen.

## 2020-03-06 ENCOUNTER — Encounter: Payer: Medicaid Other | Admitting: Surgical

## 2020-03-10 ENCOUNTER — Telehealth: Payer: Self-pay | Admitting: Plastic Surgery

## 2020-03-10 NOTE — Assessment & Plan Note (Signed)
FU as needed with ID Close follow up with dermatology and plastics Continue bactrim prophylaxis until off high-dose prednisone  Continue IV therapy as outlined through 02/05/20

## 2020-03-10 NOTE — Telephone Encounter (Signed)
Adrienna from [employer] called to request wound measurements for patient. Her direct number is [phone] ext [phone]. Info can be faxed to [phone].

## 2020-03-11 ENCOUNTER — Encounter: Payer: Medicaid Other | Admitting: Surgical

## 2020-03-11 NOTE — Telephone Encounter (Signed)
Faxed Op notes showing wound measurements to [place] with [employer].

## 2020-03-13 ENCOUNTER — Encounter: Payer: Self-pay | Admitting: Plastic Surgery

## 2020-03-13 ENCOUNTER — Ambulatory Visit (INDEPENDENT_AMBULATORY_CARE_PROVIDER_SITE_OTHER): Payer: Medicaid Other | Admitting: Plastic Surgery

## 2020-03-13 ENCOUNTER — Other Ambulatory Visit: Payer: Self-pay

## 2020-03-13 VITALS — BP 125/82 | HR 93

## 2020-03-13 DIAGNOSIS — L089 Local infection of the skin and subcutaneous tissue, unspecified: Secondary | ICD-10-CM

## 2020-03-13 DIAGNOSIS — T148XXA Other injury of unspecified body region, initial encounter: Secondary | ICD-10-CM

## 2020-03-13 NOTE — Progress Notes (Signed)
The patient is a 35 year old female here for follow-up on her leg wound.  She did a VAC change on her own yesterday and has an excellent seal.  Because of that we will not change it today and we will see her next week.

## 2020-03-19 ENCOUNTER — Telehealth: Payer: Self-pay | Admitting: Plastic Surgery

## 2020-03-19 NOTE — Telephone Encounter (Signed)
Patient called to say that [employer] is saying she was discharged from wound vac care so she is unable to get what she needs for it. It's possible she needs a new order to be sent so [employer] knows she still needs the wound vac. Please call patient to advise.

## 2020-03-19 NOTE — Telephone Encounter (Addendum)
Returned patients call. She was notified from [facility] that wound VAC service was cancelled on 02/27/20 and being charged for rental fees. Currently she has no supplies and was last changed on 03/12/20 prior to her office visit 03/13/20. A fax was sent to Jeanine Luz providing a copy of OP notes on indicating the wound measurements on 03/11/20 per their request. I e-mailed and faxed Adrianna a copy of her last visit on 03/13/2020. I have requested for continous service for 02/27/20 until further notice, along supplies with no interruptions and no charges with fees. I indicated as well patient has a follow up appointment tomorrow and these notes will be sent to [facility] as well.    03/20/2020-Called Dawn with [employer]. Advised her the situation with the wound VAC being cancelled on 02/27/20, having no supplies and the last time was changed on 03/12/20. She will check into the situation and will make sure patient will get supplies ASAP.Marland Kitchen  Spoke with [facility] after patients visit. Discussed the wound appeared to some what improved. We are trying a new treatment until her next appointment for further evaluation. We did not place the wound VAC back on today.  However, she may need to go back using the machine in the future. Dawn indicated that [employer] received a signed cancellation to cancel current order.  However, this was a miscommunication.  Patient had a previous order prior to surgery on 02/27/2020. We will make sure the new order is reactivated.

## 2020-03-20 ENCOUNTER — Ambulatory Visit (INDEPENDENT_AMBULATORY_CARE_PROVIDER_SITE_OTHER): Payer: Medicaid Other | Admitting: Surgical

## 2020-03-20 ENCOUNTER — Encounter: Payer: Self-pay | Admitting: Surgical

## 2020-03-20 ENCOUNTER — Other Ambulatory Visit: Payer: Self-pay

## 2020-03-20 VITALS — BP 116/76 | HR 110

## 2020-03-20 DIAGNOSIS — S81802D Unspecified open wound, left lower leg, subsequent encounter: Secondary | ICD-10-CM | POA: Diagnosis not present

## 2020-03-20 DIAGNOSIS — T148XXA Other injury of unspecified body region, initial encounter: Secondary | ICD-10-CM | POA: Diagnosis not present

## 2020-03-20 DIAGNOSIS — L089 Local infection of the skin and subcutaneous tissue, unspecified: Secondary | ICD-10-CM

## 2020-03-20 DIAGNOSIS — Z4689 Encounter for fitting and adjustment of other specified devices: Secondary | ICD-10-CM | POA: Diagnosis not present

## 2020-03-20 NOTE — Progress Notes (Signed)
Referring Provider Jeanie Sewer, NP [place],  [name] [id number]   CC:  Chief Complaint  Patient presents with  . Post-op Follow-up      [name] is an 35 y.o. female.  HPI: Patient is a 35 year old female here for follow-up on her left leg wound.  She was initially seen while hospitalized, she subsequently underwent excision of her left leg wound with placement of wound matrix on 01/08/2020.  Prior to her hospitalization she was being treated for pyoderma gangrenosum at an outside office and then subsequently had a gap in care due to the outside office closing.  She then started wound care treatment with the wound care clinic here in [place] and then subsequently was hospitalized on 01/04/2020.  She is currently being followed by infectious disease as well.  Her most recent intervention was on 02/27/2020 when she underwent left leg debridement and placement of a wound matrix with Dr. Marla Roe.  She does have a past medical history significant for diabetes mellitus -most recent A1c was 2 months ago and was 6.7.  While hospitalized the patient reported to me that she was diagnosed with pyoderma gangrenosum approximately 3 years from 01/06/2020.  She subsequently had a biopsy on 01/08/2020.  It was recommended patient follow-up with dermatology after discharge, today she reports she has been unable to make it to a dermatology appointment.  She reports she had one scheduled, however she had to cancel the appointment due to family health concerns.  She reports that her mother is not doing well and has a terminal diagnosis.  She reports that she is not having any infectious symptoms.  She reports she has been discharged from the infectious disease team.  No fevers, chills, nausea, vomiting.  She reports that she does have ongoing pain, however she is currently having this treated by her PCP.  She reports a history of addiction so she is very cautious with what narcotics/pain  medication she has been taking.  She reports that she no longer has any wound VAC supplies, therefore she did a dressing change recently with K-Y jelly, 4 x 4 gauze, ABD and Kerlix.  Review of Systems General: No fevers, chills, nausea, vomiting  Physical Exam Vitals with BMI 03/20/2020 03/13/2020 02/27/2020  Height - - -  Weight - - -  BMI - - -  Systolic 628 366 294  Diastolic 76 82 81  Pulse 765 93 77    General:  No acute distress,  Alert and oriented, Non-Toxic, Normal speech and affect Left lower extremity: Significant wound to the left lower extremity.  She has a fair amount of exudate noted within the wound bed.  Foul odor is noted.  No cellulitic changes are noted.  She does have tenderness to palpation.  All of the wound matrix has incorporated.  There is no clearly exposed tendon or muscle.  No bone is noted.  Her base of granulation tissue is nearly flush with the surrounding epithelium.          Assessment/Plan  Given the good base of granulation tissue and the granulation tissue being nearly flush with the surrounding epithelium I think using a alginate will be helpful with exudate control.  I did discuss with the patient that after we have control of the exudate I would like to try and endoform dressing.  I discussed with the patient that I would like to see her back in 1 week for reevaluation.  Advised her to call  with any questions or concerns, if her symptoms worsen.   Given the complexity of the wound, I think it may be necessary for the patient to use the wound VAC again, however I think at this time we will hold off at this time.  Pictures were obtained of the patient and placed in the chart with the patient's or guardian's permission.  [name] 03/20/2020, 9:13 AM

## 2020-03-26 NOTE — Progress Notes (Signed)
   Referring Provider Jeanie Sewer, NP [place],  [place] [id number]   CC:  Chief Complaint  Patient presents with  . Follow-up      [name] is an 35 y.o. female.  HPI: Patient is a 35 year old female here for follow-up on her left leg wound.  She was last seen in the office on 03/13/2020.  Patient reports she has been doing alginate dressing changes, she reports she has been trying to change it daily, however sometimes is changing it every 2 days. She is currently the sole caregiver for her mother who has recently had significant decline in her health.  They are currently working on setting up hospice to assist with her care.   She reports she has been unable to set up dermatology consultation/follow-up due to the current situation with her family.   She reports no infectious symptoms.  Review of Systems General: No fevers, chills, nausea, vomiting  Physical Exam Vitals with BMI 03/27/2020 03/20/2020 03/13/2020  Height - - -  Weight - - -  BMI - - -  Systolic 778 242 353  Diastolic 90 76 82  Pulse 90 110 93    General:  No acute distress,  Alert and oriented, Non-Toxic, Normal speech and affect Head: Normocephalic and atraumatic Left lower extremity: Left lower extremity wound is 13 x 25 x 2 cm.  There is no surrounding cellulitic changes.  She does have some edema inferiorly.  There is no significant foul odor noted.  She overall has a mixture of granulation tissue and exudate within the wound bed. Behavior: Tearful   Assessment/Plan Recommend continuing with alginate dressing changes daily, we provided her with some supplies today.  We will order her some supplies through [facility] for her to start doing endoform dressing changes daily.  I discussed with her the protocol for endoform dressing changes and how to monitor if she needs to add additional sheets.  I discussed with her to leave the sheets in place to prevent disrupting the wound scaffold.  I  do recommend that she reach out to her primary care physician in regards to her current familial situation.  I think counseling/therapy may be a good option for her as she is going through a significant life change and the declining health of her mother.  We did spend some time talking about this today and she is in agreement with this and reports that her children are currently in counseling as well and she would try to reach out to her PCP in regards to this.  I would like to see her back in 1 to 2 weeks for reevaluation.  I recommend she elevate the leg as often as possible to help decrease swelling.  I do not see any signs of overt infection.  I do think she should be evaluated by dermatology.  Pictures were obtained of the patient and placed in the chart with the patient's or guardian's permission.  Recommend calling with questions or concerns.  [name] 03/27/2020, 2:44 PM

## 2020-03-27 ENCOUNTER — Other Ambulatory Visit: Payer: Self-pay

## 2020-03-27 ENCOUNTER — Encounter: Payer: Self-pay | Admitting: Surgical

## 2020-03-27 ENCOUNTER — Telehealth: Payer: Self-pay

## 2020-03-27 ENCOUNTER — Ambulatory Visit (INDEPENDENT_AMBULATORY_CARE_PROVIDER_SITE_OTHER): Payer: Medicaid Other | Admitting: Surgical

## 2020-03-27 VITALS — BP 128/90 | HR 90

## 2020-03-27 DIAGNOSIS — S81802D Unspecified open wound, left lower leg, subsequent encounter: Secondary | ICD-10-CM

## 2020-03-27 NOTE — Telephone Encounter (Signed)
Called patient to confirm when the service with [facility] stopped. She was on her way home and did not have a cell phone. Spoke with Cecille Rubin, sister who confirmed her [facility] stopped in February 2022.   Faxed order to [facility] although some supplies may not be covered with Medicare and requested to offer patient a lower/discount price.  Daily-ABD pads, 4x4 gauze, endoform,  Kerlix-2x's a day

## 2020-03-30 ENCOUNTER — Telehealth: Payer: Self-pay

## 2020-03-30 NOTE — Telephone Encounter (Signed)
Received order status notification from [facility]. They contacted patient with pricing options, as her health plan does not cover (Endoform, ABD pad, rolled gauze). Covered items were shipped. Patient declined to purchase non covered items at this time.

## 2020-04-09 NOTE — Progress Notes (Incomplete)
   Subjective:     Patient ID: [name], female    DOB: 12-29-1985, 35 y.o.   MRN: [id number]  No chief complaint on file.   HPI: The patient is a 35 y.o. female here for follow-up on her left lower extremity wound.    Review of Systems   Objective:   Vital Signs There were no vitals taken for this visit. Vital Signs and Nursing Note Reviewed Chaperone present*** Physical Exam    Assessment/Plan:  No diagnosis found.  ***   Recommend calling with questions or concerns. Follow up scheduled for ***   Carola Rhine Scheeler, PA-C 04/09/2020, 3:05 PM

## 2020-04-10 ENCOUNTER — Telehealth: Payer: Self-pay

## 2020-04-10 ENCOUNTER — Ambulatory Visit: Payer: Medicaid Other | Admitting: Surgical

## 2020-04-10 NOTE — Telephone Encounter (Signed)
Patient called to say that she needs to cancel her appointment for today.  Patient said that her mom is on hospice at home and her sister is not handling it very well, so she is the only one who is staying with her right now.  Patient said that Roetta Sessions, PA-C, had ordered some dressings for her wound but Medicaid will only pay for the 4x4 gauze.  Patient said that she changes her dressing every 48 hours and it hasn't gotten worse.  She did say that there is a smell but it doesn't smell like an infection.  Patient is concerned about reinfection the longer she uses just the gauze.  Please call.

## 2020-04-10 NOTE — Telephone Encounter (Signed)
Return patients call. Per Catalina Antigua, she will need to go to [facility] supply, or order online [employer] Alginate for proper care of  wound. Also offered few donated Alginate dressings at our office as well as mentioned [facility] has a Hardship plan. Explained to her it will go through a process from the care management team for review to see if she qualifies for free products 1-[redacted] weeks along with lower discount on supplies. Her mother is currently in Hospice and will not leave her during this time. She is not able to go the medical supply store, nor come by the office for the donated supplies. She will check the prices on [employer]. Will e-mail the hard ship form.

## 2020-04-10 NOTE — Telephone Encounter (Signed)
I called the patient after speaking with Roetta Sessions, PA-C to see if she would be able to come in to see Audelia Hives, DO on 04/14/2020.  Patient said that she has to stay with her mom who is on hospice care at home.  Patient said that she may be able to get a volunteer from hospice to come stay with her mom, but she has to give them at least a one week notice, and even then it's not guaranteed that they will be available.  I scheduled the patient for 05/05/2020, which is Matt's next available time for an office visit.  I also let the patient know that our nursing staff will call her regarding wound care supplies.

## 2020-04-13 ENCOUNTER — Telehealth: Payer: Self-pay

## 2020-04-13 NOTE — Telephone Encounter (Signed)
Adriana called from [facility] to request patient's wound measurements from 03/20/2020 - 04/03/2020.  Please fax them to [phone].

## 2020-04-13 NOTE — Telephone Encounter (Signed)
Faxed VAC Wound Progress Tracking back to [employer]. Patient's would VAC was removed on 03/20/20 for a "vacation". No measurements were taken. Last appointment 03/27/20 13/25/x2cm. Patient still does not have wound VAC. 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

## 2020-04-14 ENCOUNTER — Telehealth: Payer: Self-pay

## 2020-04-14 NOTE — Telephone Encounter (Signed)
Adriana called from [place] to say that she heard that the patient was taking a break from the wound vac.  She asked when the patient stopped using the wound vac and if there are any plans to continue use.  Fabio Bering said if the patient stops using the wound vac for more than 15 days, then they have to discontinue the wound vac.  Please call.

## 2020-04-16 NOTE — Telephone Encounter (Signed)
Returned [name]'s call. She was not available, spoke with Amy. Advised patient's wound VAC was removed on 03/20/20. Wound was not measured at the time, but at the next visit on 03/27/2020 it is 13x25xcm.  Patient still does not have wound VAC at this time, but possible will need to go back on it in the future. Her next appointment is not until 05/05/2020.  Her mother is currently at [facility] and patient will not leave her side. Amy explained once over 15 days with no use of machine, they will have to pick it up.  If Bevin will need to go back on it, we will have to fill out a new application.

## 2020-05-05 ENCOUNTER — Ambulatory Visit (INDEPENDENT_AMBULATORY_CARE_PROVIDER_SITE_OTHER): Payer: Medicaid Other | Admitting: Surgical

## 2020-05-05 ENCOUNTER — Other Ambulatory Visit: Payer: Self-pay

## 2020-05-05 DIAGNOSIS — T148XXA Other injury of unspecified body region, initial encounter: Secondary | ICD-10-CM

## 2020-05-05 DIAGNOSIS — S81802D Unspecified open wound, left lower leg, subsequent encounter: Secondary | ICD-10-CM

## 2020-05-05 DIAGNOSIS — L089 Local infection of the skin and subcutaneous tissue, unspecified: Secondary | ICD-10-CM | POA: Diagnosis not present

## 2020-05-05 NOTE — Progress Notes (Signed)
Referring Provider Jeanie Sewer, NP [address] [place],  [place] [address]   CC:  Chief Complaint  Patient presents with  . Follow-up      [name] is an 35 y.o. female.  HPI: Patient is a 35 year old female here for follow-up on her left leg wound.  Patient previously had difficulty coming to appointments due to her mother being on hospice care.  She reports that her mother passed away last week.  We are very sorry to hear this.  She reports that she has not been able to obtain any dressing supplies.  She reports that she has been doing gauze to the area.  She feels as if the drainage from the wound has been irritating the surrounding skin is causing some burning.  She reports that she is having some increased pain as well.  She is not having any infectious symptoms.  She reports that she is going to see her PCP soon and ask for an additional referral to see dermatology.  She has previously been managed by Dr. Sharol Roussel at [facility] for her lower extremity wound.  She is hopeful to be able to see him again for medication management.  There is some question whether this is pyoderma gangrenosum or not from biopsy on 01/08/2020.  She has a history of osteomyelitis.  She reports that she has not seen ID for this as she has been doing well.    Review of Systems General: No fevers or chills  Physical Exam Vitals with BMI 03/27/2020 03/20/2020 03/13/2020  Height - - -  Weight - - -  BMI - - -  Systolic [phone]  Diastolic 90 76 82  Pulse 90 110 93    General:  No acute distress,  Alert and oriented, Non-Toxic, Normal speech and affect Left lower extremity: Left lower extremity wound is overall approximately 13 x 25 x 2 cm.  There is no surrounding cellulitic changes.  She has some chronic edema inferiorly.  No foul odor is noted.  She does have some exposed tendon anteriorly.  There is a mixture of granulation tissue and exudate noted within the wound bed.  There  is no purulence noted.  Distal extremity is warm to touch.  No calf tenderness noted. Foot/ankle: Significantly decreased range of motion of the left lower extremity at the ankle joint. Mood: Tearful      Assessment/Plan  Patient is a 35 year old female with a significant left lower extremity wound that has been present for many years.  She has been treated by multiple providers over the past few years.  She was initially treated at the [facility] wound care clinic which has subsequently closed.  She was then treating the wound at home by herself for many months prior to being evaluated in the wound care clinic here in [place] with Dr. Dellia Nims.  Patient subsequently was admitted to the hospital for infection and underwent debridement on 01/08/2020 with Dr. Marla Roe.  She subsequently underwent debridement on 02/27/2020 with Dr. Marla Roe.  She has been caring for her mother who has been on hospice care at home.  Unfortunately her mother recently passed about 1 week ago.  She has been very busy caring for her mother and during that time did miss multiple appointments as there was no one else to care for her mother during that time.  She is also having difficulty obtaining wound care supplies.  We have ordered her supplies through [facility], however her insurance will  not cover anything but basic supplies like gauze.  She has had minimal improvement in her wound over the past few months.  We discussed today that I think a second opinion would be beneficial.  I also discussed with her that I strongly encouraged her to follow-up with dermatology as it seems she had significant improvement with systemic treatment for the wound.  Patient is understanding of this.  Patient was provided with some donated calcium alginate today.  I discussed with her that I would recommend calling [facility] and discussing their assistance program.  Provided her with their phone number.  All of her questions were answered to her  content.  There is no sign of overt infection today on exam.  Pictures were taken and placed in the patient's chart with patient's permission.  Recommend following up with our office in 2 weeks.  Referral to wound care was sent.  Patient has a very complex problem with a lot of barriers for treatment.  Greater than 30 minutes was spent discussing patient's medical history, discussing wound care recommendations, discussing referral options and plan for care.  Greater than 50% of this time was spent in face-to-face discussion with the patient.   [name] 05/05/2020, 11:20 AM

## 2020-05-26 ENCOUNTER — Encounter: Payer: Self-pay | Admitting: Surgical

## 2020-05-26 ENCOUNTER — Ambulatory Visit (INDEPENDENT_AMBULATORY_CARE_PROVIDER_SITE_OTHER): Payer: Medicaid Other | Admitting: Surgical

## 2020-05-26 ENCOUNTER — Other Ambulatory Visit: Payer: Self-pay

## 2020-05-26 DIAGNOSIS — L089 Local infection of the skin and subcutaneous tissue, unspecified: Secondary | ICD-10-CM

## 2020-05-26 DIAGNOSIS — S81802D Unspecified open wound, left lower leg, subsequent encounter: Secondary | ICD-10-CM

## 2020-05-26 DIAGNOSIS — T148XXA Other injury of unspecified body region, initial encounter: Secondary | ICD-10-CM | POA: Diagnosis not present

## 2020-05-26 NOTE — Progress Notes (Signed)
Referring Provider Kaitlyn Sewer, NP [place],  [place] [address]   CC:  Chief Complaint  Patient presents with  . Follow-up      [name] is an 35 y.o. female.  HPI: Patient is a 35 year old female here for follow-up on her left lower extremity wound that she has had for multiple years.  There is some question whether the wound is pyoderma gangrenosum or not.  She also has a history of osteomyelitis.  She previously has an exposed tendon in the location of the wound.  We have tried multiple different dressing changes for her left lower extremity, however she is limited in regards to options for dressing supplies as she is unable to purchase them out-of-pocket and her insurance will not cover the wound care supplies that we have tried to obtain via [facility] wound care products.  At her last visit I did provide her with the [facility] number to call and discuss [facility] assistance program for patients that would be unable to pay out-of-pocket.  She reports that she has continued to use plain gauze dressing changes daily.  She has continued to notice a lot of drainage from the left lower extremity.  She previously had a scheduled appointment to see dermatology at [facility] with Dr. Sharol Reyes who previously managed her lower extremity systemic with steroids but did not show for her appointment after discharge from the hospital due to her mother being ill at the time.  She reports that she will need an additional referral as her insurance has changed.  She has also been referred to the wound care team and has an appointment with wound care clinic on 06/02/2020 for consultation and further management   Review of Systems General: No nausea or vomiting or chills.  Reports occasional fevers   Physical Exam Vitals with BMI 03/27/2020 03/20/2020 03/13/2020  Height - - -  Weight - - -  BMI - - -  Systolic [phone]  Diastolic 90 76 82  Pulse 90 110 93    General:  Alert and  oriented, Non-Toxic, Normal speech and affect.  No acute distress.  Anxious. Left lower extremity: Left lower extremity wound with minimal improvement.  The wound is significantly large and there is minimal new epithelialization noted.  The wound bed is a mixture of exudate and granulation tissue.  There is no foul odor or purulence noted.  She does have significant tenderness to the left lower extremity.  She has significant swelling of the left lower extremity.  Distal extremity is warm to touch.  Assessment/Plan 35 year old female with significant left lower extremity wound that she has had for many years.  Referral sent to [facility] dermatology for ongoing medical management of the left lower extremity wound.  She previously was managed at this office but missed her last appointment and then subsequently had insurance change so needed an additional referral.  Patient has a wound care clinic appointment on 06/02/2020 for ongoing wound care management.  I discussed with her that she would no longer need to follow-up in our office for wound care.  Patient has been doing 4 x 4 gauze dressing changes as she is unable to obtain supplies due to cost.  I am unsure if she called the [facility] assistance program or not.  Patient has significant swelling of her left lower extremity which is concerning for DVT.  I discussed with the patient that she needs to be evaluated in the emergency department  today and she reported that she would be unable to do that as she has an appointment for her son that she needs to bring him to.  I discussed with her that my recommendation is to be evaluated in the ED as soon as possible for concern for DVT of the left lower extremity given the significant swelling and tenderness.  I do not see any overt signs of infection on exam.  Pictures were taken and placed in the patient's chart with patient's permission. We remain available as needed for any surgical intervention.  [name]  [name] 05/26/2020, 11:28 AM

## 2020-06-02 ENCOUNTER — Encounter (HOSPITAL_BASED_OUTPATIENT_CLINIC_OR_DEPARTMENT_OTHER): Payer: Medicaid Other | Attending: Internal Medicine | Admitting: Internal Medicine

## 2021-09-27 ENCOUNTER — Encounter: Payer: Self-pay | Admitting: *Deleted

## 2021-12-16 ENCOUNTER — Encounter: Payer: Self-pay | Admitting: *Deleted
